# Patient Record
Sex: Female | Born: 1970 | Race: White | Hispanic: No | Marital: Married | Smoking: Never smoker
Health system: Southern US, Community
[De-identification: ages and names within clinical notes are randomized; demographics above are authoritative.]

## PROBLEM LIST (undated history)

## (undated) DIAGNOSIS — M7989 Other specified soft tissue disorders: Secondary | ICD-10-CM

## (undated) DIAGNOSIS — E559 Vitamin D deficiency, unspecified: Secondary | ICD-10-CM

## (undated) DIAGNOSIS — K5792 Diverticulitis of intestine, part unspecified, without perforation or abscess without bleeding: Secondary | ICD-10-CM

## (undated) DIAGNOSIS — K829 Disease of gallbladder, unspecified: Secondary | ICD-10-CM

## (undated) DIAGNOSIS — R519 Headache, unspecified: Secondary | ICD-10-CM

## (undated) DIAGNOSIS — M549 Dorsalgia, unspecified: Secondary | ICD-10-CM

## (undated) DIAGNOSIS — Z91013 Allergy to seafood: Secondary | ICD-10-CM

## (undated) DIAGNOSIS — R12 Heartburn: Secondary | ICD-10-CM

## (undated) DIAGNOSIS — K30 Functional dyspepsia: Secondary | ICD-10-CM

## (undated) DIAGNOSIS — R921 Mammographic calcification found on diagnostic imaging of breast: Secondary | ICD-10-CM

## (undated) DIAGNOSIS — K5900 Constipation, unspecified: Secondary | ICD-10-CM

## (undated) DIAGNOSIS — Z91018 Allergy to other foods: Secondary | ICD-10-CM

## (undated) DIAGNOSIS — R079 Chest pain, unspecified: Secondary | ICD-10-CM

## (undated) DIAGNOSIS — F419 Anxiety disorder, unspecified: Secondary | ICD-10-CM

## (undated) DIAGNOSIS — E538 Deficiency of other specified B group vitamins: Secondary | ICD-10-CM

## (undated) DIAGNOSIS — G4730 Sleep apnea, unspecified: Secondary | ICD-10-CM

## (undated) DIAGNOSIS — K219 Gastro-esophageal reflux disease without esophagitis: Secondary | ICD-10-CM

## (undated) DIAGNOSIS — R002 Palpitations: Secondary | ICD-10-CM

## (undated) DIAGNOSIS — R42 Dizziness and giddiness: Secondary | ICD-10-CM

## (undated) DIAGNOSIS — I10 Essential (primary) hypertension: Secondary | ICD-10-CM

## (undated) DIAGNOSIS — M2550 Pain in unspecified joint: Secondary | ICD-10-CM

## (undated) DIAGNOSIS — M797 Fibromyalgia: Secondary | ICD-10-CM

## (undated) HISTORY — DX: Vitamin D deficiency, unspecified: E55.9

## (undated) HISTORY — DX: Essential (primary) hypertension: I10

## (undated) HISTORY — DX: Constipation, unspecified: K59.00

## (undated) HISTORY — DX: Other specified soft tissue disorders: M79.89

## (undated) HISTORY — DX: Gastro-esophageal reflux disease without esophagitis: K21.9

## (undated) HISTORY — DX: Allergy to other foods: Z91.018

## (undated) HISTORY — DX: Heartburn: R12

## (undated) HISTORY — DX: Fibromyalgia: M79.7

## (undated) HISTORY — DX: Diverticulitis of intestine, part unspecified, without perforation or abscess without bleeding: K57.92

## (undated) HISTORY — DX: Functional dyspepsia: K30

## (undated) HISTORY — DX: Dorsalgia, unspecified: M54.9

## (undated) HISTORY — DX: Mammographic calcification found on diagnostic imaging of breast: R92.1

## (undated) HISTORY — DX: Sleep apnea, unspecified: G47.30

## (undated) HISTORY — DX: Deficiency of other specified B group vitamins: E53.8

## (undated) HISTORY — DX: Dizziness and giddiness: R42

## (undated) HISTORY — DX: Pain in unspecified joint: M25.50

## (undated) HISTORY — DX: Allergy to seafood: Z91.013

## (undated) HISTORY — DX: Anxiety disorder, unspecified: F41.9

## (undated) HISTORY — DX: Chest pain, unspecified: R07.9

## (undated) HISTORY — DX: Disease of gallbladder, unspecified: K82.9

## (undated) HISTORY — DX: Palpitations: R00.2

## (undated) HISTORY — DX: Headache, unspecified: R51.9

---

## 1994-03-09 HISTORY — PX: TONSILLECTOMY: SUR1361

## 1994-03-09 HISTORY — PX: GALLBLADDER SURGERY: SHX652

## 2007-03-10 HISTORY — PX: TOTAL VAGINAL HYSTERECTOMY: SHX2548

## 2008-03-09 HISTORY — PX: OTHER SURGICAL HISTORY: SHX169

## 2018-03-09 DIAGNOSIS — Z8781 Personal history of (healed) traumatic fracture: Secondary | ICD-10-CM

## 2018-03-09 HISTORY — DX: Personal history of (healed) traumatic fracture: Z87.81

## 2018-03-09 HISTORY — PX: OTHER SURGICAL HISTORY: SHX169

## 2020-04-04 ENCOUNTER — Encounter: Payer: Self-pay | Admitting: Neurology

## 2020-04-04 ENCOUNTER — Ambulatory Visit (INDEPENDENT_AMBULATORY_CARE_PROVIDER_SITE_OTHER): Payer: 59 | Admitting: Neurology

## 2020-04-04 ENCOUNTER — Other Ambulatory Visit: Payer: Self-pay

## 2020-04-04 ENCOUNTER — Ambulatory Visit: Payer: 59 | Admitting: Neurology

## 2020-04-04 VITALS — BP 129/82 | HR 80 | Ht 67.0 in | Wt 240.0 lb

## 2020-04-04 DIAGNOSIS — H81399 Other peripheral vertigo, unspecified ear: Secondary | ICD-10-CM

## 2020-04-04 DIAGNOSIS — M542 Cervicalgia: Secondary | ICD-10-CM

## 2020-04-04 DIAGNOSIS — R42 Dizziness and giddiness: Secondary | ICD-10-CM | POA: Diagnosis not present

## 2020-04-04 DIAGNOSIS — R51 Headache with orthostatic component, not elsewhere classified: Secondary | ICD-10-CM

## 2020-04-04 NOTE — Patient Instructions (Signed)
Discussed: muscle relaxers, botox(Dr. Terrace Arabia), dry needling and massage (PT or [email or web address]),  occipital nerve blocks, radiofrequency ablation of the occipital nerves, c2/c3 medial branch blocks  For dizziness: Vestibular rehabilitation. Consider "Vestibular Migraines" or cervicogenic dizziness  My recommendations: Vestibular therapy ([facility]) MRI of the brain  Dry needling and massage ([email or web address]) - I can place in the referral to see if they can do it in conjunctions with the vestibular therapy Contact us if you would like to try any of the above
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
## 2020-04-07 ENCOUNTER — Encounter: Payer: Self-pay | Admitting: Neurology

## 2020-04-07 DIAGNOSIS — M542 Cervicalgia: Secondary | ICD-10-CM | POA: Insufficient documentation

## 2020-04-07 DIAGNOSIS — R42 Dizziness and giddiness: Secondary | ICD-10-CM | POA: Insufficient documentation

## 2020-04-08 ENCOUNTER — Telehealth: Payer: Self-pay | Admitting: Neurology

## 2020-04-08 NOTE — Telephone Encounter (Signed)
no to the covid questions MR Brain w/wo contrast Dr. Lucia Gaskins [facility] Berkley Harvey: [id number] (exp. 04/08/20 to 05/23/20). Patient is scheduled at [facility] for 04/16/20.

## 2020-04-15 NOTE — Telephone Encounter (Signed)
Pt cancel MRI via automatic machine and I left the patient a voicemail to make sure she wanted to cancel and if she wanted to r/s to call my number and I left my direct number.

## 2020-04-16 ENCOUNTER — Other Ambulatory Visit: Payer: 59

## 2020-04-24 NOTE — Telephone Encounter (Signed)
Patient left me a voicemail on my phone wanted to r/s her MRI. I called the patient back and she wants to r/s her MRI but she wants to know is the contrast needed for the MRI or is it okay if she does it without contrast?

## 2020-04-24 NOTE — Telephone Encounter (Signed)
Yes, contrast is important for this patient's MRI. thanks

## 2020-04-25 NOTE — Telephone Encounter (Signed)
Noted, patient is scheduled at [facility] for 05/01/20.

## 2020-04-29 ENCOUNTER — Ambulatory Visit: Payer: 59 | Admitting: Physical Therapy

## 2020-05-01 ENCOUNTER — Other Ambulatory Visit: Payer: Self-pay

## 2020-05-01 ENCOUNTER — Ambulatory Visit: Payer: 59

## 2020-05-01 ENCOUNTER — Other Ambulatory Visit: Payer: Self-pay | Admitting: Neurology

## 2020-05-01 DIAGNOSIS — R42 Dizziness and giddiness: Secondary | ICD-10-CM

## 2020-05-01 DIAGNOSIS — R51 Headache with orthostatic component, not elsewhere classified: Secondary | ICD-10-CM

## 2020-05-02 ENCOUNTER — Other Ambulatory Visit: Payer: Self-pay

## 2020-05-02 ENCOUNTER — Ambulatory Visit: Payer: 59 | Attending: Neurology | Admitting: Physical Therapy

## 2020-05-02 ENCOUNTER — Encounter: Payer: Self-pay | Admitting: Physical Therapy

## 2020-05-02 ENCOUNTER — Other Ambulatory Visit: Payer: Self-pay | Admitting: Neurology

## 2020-05-02 DIAGNOSIS — R262 Difficulty in walking, not elsewhere classified: Secondary | ICD-10-CM | POA: Diagnosis present

## 2020-05-02 DIAGNOSIS — M542 Cervicalgia: Secondary | ICD-10-CM | POA: Insufficient documentation

## 2020-05-02 DIAGNOSIS — R42 Dizziness and giddiness: Secondary | ICD-10-CM | POA: Diagnosis present

## 2020-05-02 DIAGNOSIS — R2681 Unsteadiness on feet: Secondary | ICD-10-CM | POA: Diagnosis present

## 2020-05-02 MED ORDER — ALPRAZOLAM 0.25 MG PO TABS: ORAL_TABLET | ORAL | 0 refills | Status: DC

## 2020-05-02 NOTE — Addendum Note (Signed)
Addended by: Naomie Dean B on: 05/02/2020 11:13 AM   Modules accepted: Orders

## 2020-05-02 NOTE — Telephone Encounter (Signed)
The xanax is a good muscle relaxer, it should help with her neck pain too. I would try the xanax.

## 2020-05-02 NOTE — Therapy (Signed)
[facility] [address] [place], [place], [address] Phone: [phone]   Fax:  [phone]  Physical Therapy Evaluation  Patient Details  Name: Veronica Hunter MRN: [id number] Date of Birth: 04/19/1970 Referring Provider (PT): Anson Fret, MD   Encounter Date: 05/02/2020   PT End of Session - 05/02/20 2152    Visit Number 1    Number of Visits 17    Date for PT Re-Evaluation 07/01/20    Authorization Type [employer]; VL: 30    PT Start Time 1320    PT Stop Time 1410    PT Time Calculation (min) 50 min    Activity Tolerance Other (comment)   limited by dizziness   Behavior During Therapy [facility] for tasks assessed/performed           Past Medical History:  Diagnosis Date  . H/O fracture 2020   left foot, right ankle    Past Surgical History:  Procedure Laterality Date  . GALLBLADDER SURGERY  1996  . right ankle fracture surgery  2020   2 plates/10 screws   . right ankle tendon repair  2010  . TONSILLECTOMY  1996  . TOTAL VAGINAL HYSTERECTOMY  2009    There were no vitals filed for this visit.    Subjective Assessment - 05/02/20 1330    Subjective First episode of dizziness occurred in March 2019; [place] when sudden sense of spinning occurs with HA.  Symptoms settled when pt was able to lie down and rest.  When pt feels stressed or anxious she begins to feel pain radiate up her head, around to the eye, shoulders or jaw, sense of being on a boat, a "shock" feeling and vision feels unfocused.  Pt sometimes will have a HA.  Intermittently becomes sensitive to sound, smells or light.  Pt can have nausea but no vomiting.  Has a slight change in hearing.  Pt does have tinnitus.  Pt even feels symptoms after she eats with reflux.  Pt also reports when she lies down her arms will go numb; is now having to sleep on her back.  Episodes can last seconds up to 30 minutes.  Occurs daily.    Pertinent  History h/o R foot/ankle fracture with surgery and tendon repair, getting ready to have plates/screw removed, GERD, panic/anxiety    Diagnostic tests ENT; attempted to have MRI yesterday but not able to complete    Patient Stated Goals Try to figure out what the root cause of the symptoms are and not make it worse    Currently in Pain? Yes    Pain Score 5     Pain Location Neck    Pain Orientation Right    Pain Descriptors / Indicators Radiating;Headache    Pain Onset More than a month ago              [facility] PT Assessment - 05/02/20 1351      Assessment   Medical Diagnosis Vertigo    Referring Provider (PT) Anson Fret, MD    Onset Date/Surgical Date 04/07/20   referral date     Precautions   Precautions Other (comment)    Precaution Comments h/o R foot/ankle fracture with surgery and tendon repair      Balance Screen   Has the patient fallen in the past 6 months No      Home Environment   Living Environment Private residence    Living Arrangements Spouse/significant  other;Children    Type of Home House    Additional Comments Is driving; has had symptoms when driving      Prior Function   Level of Independence Independent    Vocation Requirements will not be working until summer      Observation/Other Assessments   Focus on Therapeutic Outcomes (FOTO)  Dizziness Positional Status: 59; Dizziness Functional Status: 46.2      Sensation   Light Touch Appears Intact      Coordination   Gross Motor Movements are Fluid and Coordinated Yes    Finger Nose Finger Test [facility]    Heel Shin Test WFL      ROM / Strength   AROM / PROM / Strength Strength;AROM      AROM   Overall AROM  Deficits    Overall AROM Comments Will assess at next visit; pt began to experience symptoms with strength testing and palpation and was concerned about driving afterwards      Strength   Overall Strength Deficits;Within functional limits for tasks performed    Overall Strength Comments  Bilat LE WFL; UE: shoulders 4-/5, elbow flexion/extension 4+/5, grip 4+/5      Palpation   Palpation comment Pt tender to palpation over bilat upper trap, rhomboids, suboccipital muscles and reported pain with light palpation and onset of symptoms      Ambulation/Gait   Ambulation/Gait Yes    Ambulation/Gait Assistance 7: Independent    Assistive device None    Gait Pattern Within Functional Limits                  Vestibular Assessment - 05/02/20 2148      Symptom Behavior   Type of Dizziness  "Funny feeling in head";Spinning    Frequency of Dizziness daily    Duration of Dizziness seconds > 30 minutes    Symptom Nature Spontaneous   delayed onset   Aggravating Factors Spontaneous onset;Activity in general    Relieving Factors No known relieving factors    Progression of Symptoms Worse              Objective measurements completed on examination: See above findings.               PT Education - 05/02/20 2150    Education Details clinical findings, PT POC and goals, recommended pt have family member drive her next session to allow therapist to perform more in depth cervical ROM and vestibular assessment; therapist not recommending dry needling at this time due to patient's hypersensitivity to light touch, central sensitization and role in symptoms    Person(s) Educated Patient    Methods Explanation    Comprehension Verbalized understanding            PT Short Term Goals - 05/02/20 2201      PT SHORT TERM GOAL #1   Title Pt will tolerate full assessment of cervical ROM and vestibular assessment    Baseline not able to tolerate on first day    Time 4    Period Weeks    Status New    Target Date 06/01/20      PT SHORT TERM GOAL #2   Title Pt will initiate HEP focusing on vestibular and neck exercises    Time 4    Period Weeks    Status New    Target Date 06/01/20      PT SHORT TERM GOAL #3   Title Pt will report 25% reduction in frequency  of dizziness episodes on a daily basis    Time 4    Period Weeks    Status New    Target Date 06/01/20             PT Long Term Goals - 05/02/20 2203      PT LONG TERM GOAL #1   Title Pt will demonstrate independence with final HEP    Time 8    Period Weeks    Status New    Target Date 07/01/20      PT LONG TERM GOAL #2   Title Pt will increase DPS to >/= 59 and DFS to >/= 51    Baseline DPS: 52; DFS: 46.2    Time 8    Period Weeks    Status New    Target Date 07/01/20      PT LONG TERM GOAL #3   Title Pt will increase pain free cervical spine ROM by 10 degrees in all directions    Baseline TBD    Time 8    Period Weeks    Status New    Target Date 07/01/20      PT LONG TERM GOAL #4   Title Pt will report 75% reduction in symptoms on a daily basis    Time 8    Period Weeks    Status New    Target Date 07/01/20      PT LONG TERM GOAL #5   Title Vestibular goal as needed                  Plan - 05/02/20 2153    Clinical Impression Statement Pt is a 50 year old female referred to Neuro OPPT for evaluation of dizziness/vertigo that began in 2019.  Pt's PMH is significant for the following: R ankle fracture with surgery and tendon repair, GERD and pt reports anxiety/panic attacks. The following deficits were noted during pt's exam: disequilibrium, pain in neck and shoulders, decreased cervical spine ROM with increased mm tension and hypersensitivity to light touch and palpation, motion sensitivity, impaired balance and difficulty walking when having an episode of dizziness.  Will continue with more in depth cervical spine assessment and vestibular assessment next session when patient can be transported by her daughter. Pt would benefit from skilled PT to address these impairments and functional limitations to maximize functional mobility independence and reduce falls risk.    Personal Factors and Comorbidities Comorbidity 1;Fitness;Past/Current Experience;Time  since onset of injury/illness/exacerbation    Comorbidities h/o R foot/ankle fracture with surgery and tendon repair    Examination-Activity Limitations Locomotion Level;Sleep;Stand    Examination-Participation Restrictions Cleaning;Community Activity;Driving;Meal Prep;Occupation;Shop    Stability/Clinical Decision Making Evolving/Moderate complexity    Clinical Decision Making Moderate    Rehab Potential Good    PT Frequency 2x / week    PT Duration 8 weeks    PT Treatment/Interventions ADLs/Self Care Home Management;Aquatic Therapy;Canalith Repostioning;Cryotherapy;Electrical Stimulation;Moist Heat;Gait training;Stair training;Functional mobility training;Patient/family education;Therapeutic activities;Therapeutic exercise;Balance training;Neuromuscular re-education;Manual techniques;Passive range of motion;Dry needling;Vestibular    PT Next Visit Plan Neck ROM; in depth vestibular assessment.  Initiate HEP; would suboccipital release help?  TDN eventually?    Consulted and Agree with Plan of Care Patient           Patient will benefit from skilled therapeutic intervention in order to improve the following deficits and impairments:  Decreased balance,Decreased range of motion,Difficulty walking,Dizziness,Pain,Decreased activity tolerance  Visit Diagnosis: Dizziness and giddiness  Unsteadiness on feet  Cervicalgia  Difficulty in walking, not elsewhere classified     Problem List Patient Active Problem List   Diagnosis Date Noted  . Dizziness 04/07/2020  . Cervicalgia 04/07/2020    Dierdre Highman, PT, DPT 05/02/20    10:07 PM    [facility] [address] [place], [place], [address] Phone: [phone]   Fax:  [phone]  Name: Cathe Bilger MRN: [id number] Date of Birth: [birth year]

## 2020-05-02 NOTE — Telephone Encounter (Signed)
Done, thanks

## 2020-05-02 NOTE — Telephone Encounter (Signed)
Caryn Bee the MRI tech told me that the patient was unable to do the MRI because she kept having stomach issues and kept feeling like she was going to throw up. Before I call the patient to r/s do you want her to come back and it with and with out contrast?

## 2020-05-02 NOTE — Telephone Encounter (Signed)
I would like her to come back and have it with/without contrast. And I will send her in some xanax, she will need a driver.

## 2020-05-02 NOTE — Telephone Encounter (Signed)
I spoke with the patient to reschedule. She says she wants to r/s but she said she needs something to help with her pain with her neck. She said when she is laying down on that little pillow it was causing pain in her neck and that is why she could not lay there and do the MRI. I asked her if she thought she was claustrophobic she said no it was just the pain in her neck.

## 2020-05-02 NOTE — Telephone Encounter (Signed)
Okay perfect when you get a chance can you put a new MRI brain w/wo contrast order in.

## 2020-05-06 ENCOUNTER — Encounter: Payer: Self-pay | Admitting: Physical Therapy

## 2020-05-06 ENCOUNTER — Other Ambulatory Visit: Payer: Self-pay

## 2020-05-06 ENCOUNTER — Ambulatory Visit: Payer: 59 | Admitting: Physical Therapy

## 2020-05-06 DIAGNOSIS — R262 Difficulty in walking, not elsewhere classified: Secondary | ICD-10-CM

## 2020-05-06 DIAGNOSIS — M542 Cervicalgia: Secondary | ICD-10-CM

## 2020-05-06 DIAGNOSIS — R42 Dizziness and giddiness: Secondary | ICD-10-CM | POA: Diagnosis not present

## 2020-05-06 DIAGNOSIS — R2681 Unsteadiness on feet: Secondary | ICD-10-CM

## 2020-05-06 NOTE — Telephone Encounter (Signed)
Just an FYI  I spoke to the patient and informed her per Dr. Lucia Gaskins the Xanax should help with her neck. She stated that she wants to hold off for right now and do PT first. If nothing changes she stated she will call back to get it r/s.

## 2020-05-06 NOTE — Therapy (Signed)
[facility] [address] [place], [place], [address] Phone: [phone]   Fax:  [phone]  Physical Therapy Treatment  Patient Details  Name: Veronica Hunter MRN: [id number] Date of Birth: January 27, 1971 Referring Provider (PT): Anson Fret, MD   Encounter Date: 05/06/2020   PT End of Session - 05/06/20 1709    Visit Number 2    Number of Visits 17    Date for PT Re-Evaluation 07/01/20    Authorization Type UHC; VL: 30    PT Start Time 1536    PT Stop Time 1620    PT Time Calculation (min) 44 min    Activity Tolerance Other (comment)   limited by dizziness   Behavior During Therapy [facility] for tasks assessed/performed           Past Medical History:  Diagnosis Date  . H/O fracture 2020   left foot, right ankle    Past Surgical History:  Procedure Laterality Date  . GALLBLADDER SURGERY  1996  . right ankle fracture surgery  2020   2 plates/10 screws   . right ankle tendon repair  2010  . TONSILLECTOMY  1996  . TOTAL VAGINAL HYSTERECTOMY  2009    There were no vitals filed for this visit.   Subjective Assessment - 05/06/20 1541    Subjective Had a pretty good weekend but neck and back are bothering her today.  Forgot to have daughter bring her today so pt drove herself.    Pertinent History h/o R foot/ankle fracture with surgery and tendon repair, getting ready to have plates/screw removed, GERD, panic/anxiety    Diagnostic tests ENT; attempted to have MRI yesterday but not able to complete    Patient Stated Goals Try to figure out what the root cause of the symptoms are and not make it worse    Currently in Pain? Yes    Pain Score 5     Pain Location Neck    Pain Descriptors / Indicators Discomfort    Pain Onset More than a month ago              [facility] PT Assessment - 05/06/20 1544      AROM   Overall AROM  Deficits    Overall AROM Comments felt "pinchy" with lateral flexion to L and R; no  dizziness immediately after performing    AROM Assessment Site Cervical    Cervical Flexion 50    Cervical Extension 30    Cervical - Right Side Bend 22    Cervical - Left Side Bend 25    Cervical - Right Rotation 38    Cervical - Left Rotation 40               Vestibular Assessment - 05/06/20 1549      Symptom Behavior   Subjective history of current problem neck/back pain today.  No HA, fogginess or nausea today    Type of Dizziness  "Funny feeling in head";Spinning    Frequency of Dizziness daily    Duration of Dizziness seconds > 30 minutes    Symptom Nature Spontaneous    Aggravating Factors Spontaneous onset;Activity in general    Relieving Factors No known relieving factors    Progression of Symptoms Worse      Oculomotor Exam   Oculomotor Alignment Normal    Spontaneous Absent    Gaze-induced  Absent    Smooth Pursuits Comment   eye strain   Saccades Slow;Comment  worse eye train horizontally     Vestibulo-Ocular Reflex   VOR to Slow Head Movement Normal;Comment   a sense of tightness   VOR Cancellation Comment    Comment mild dizziness afterwards      Positional Sensitivities   Sit to Supine No dizziness    Supine to Left Side Lightheadedness    Supine to Right Side Lightheadedness    Supine to Sitting Lightheadedness    Nose to Right Knee No dizziness    Right Knee to Sitting Mild dizziness    Nose to Left Knee No dizziness    Left Knee to Sitting Mild dizziness    Head Turning x 5 Mild dizziness    Head Nodding x 5 No dizziness   tightness in the head   Pivot Right in Standing Mild dizziness    Pivot Left in Standing Mild dizziness    Rolling Right Lightheadedness    Rolling Left Lightheadedness    Positional Sensitivities Comments reports sense of sway/being on a boat and tightness in back of the head                            PT Education - 05/06/20 1706    Education Details clinical findings and differential diagnoses:  cervicogenic vs. vestibular migraine; motion sensitivity and treatment goals, will initiate HEP next visit    Person(s) Educated Patient    Methods Explanation    Comprehension Verbalized understanding            PT Short Term Goals - 05/02/20 2201      PT SHORT TERM GOAL #1   Title Pt will tolerate full assessment of cervical ROM and vestibular assessment    Baseline not able to tolerate on first day    Time 4    Period Weeks    Status New    Target Date 06/01/20      PT SHORT TERM GOAL #2   Title Pt will initiate HEP focusing on vestibular and neck exercises    Time 4    Period Weeks    Status New    Target Date 06/01/20      PT SHORT TERM GOAL #3   Title Pt will report 25% reduction in frequency of dizziness episodes on a daily basis    Time 4    Period Weeks    Status New    Target Date 06/01/20             PT Long Term Goals - 05/02/20 2203      PT LONG TERM GOAL #1   Title Pt will demonstrate independence with final HEP    Time 8    Period Weeks    Status New    Target Date 07/01/20      PT LONG TERM GOAL #2   Title Pt will increase DPS to >/= 59 and DFS to >/= 51    Baseline DPS: 52; DFS: 46.2    Time 8    Period Weeks    Status New    Target Date 07/01/20      PT LONG TERM GOAL #3   Title Pt will increase pain free cervical spine ROM by 10 degrees in all directions    Baseline TBD    Time 8    Period Weeks    Status New    Target Date 07/01/20      PT LONG TERM GOAL #4  Title Pt will report 75% reduction in symptoms on a daily basis    Time 8    Period Weeks    Status New    Target Date 07/01/20      PT LONG TERM GOAL #5   Title Vestibular goal as needed                 Plan - 05/06/20 1710    Clinical Impression Statement Continued to perform assessment of cervical spine and vestibular system.  Pt does present with cervical spine ROM limitations and significant visual motion sensitivity; pt also presents with some occipital  symptoms after performing repeated head/neck movements.  Unable to formally test VOR with head impulse test.  Will initiate habituation and gentle cervical ROM exercises next session.  Pt reported mild symptoms throughout assessment but had returned to baseline by end of session.    Personal Factors and Comorbidities Comorbidity 1;Fitness;Past/Current Experience;Time since onset of injury/illness/exacerbation    Comorbidities h/o R foot/ankle fracture with surgery and tendon repair    Examination-Activity Limitations Locomotion Level;Sleep;Stand    Examination-Participation Restrictions Cleaning;Community Activity;Driving;Meal Prep;Occupation;Shop    Stability/Clinical Decision Making Evolving/Moderate complexity    Rehab Potential Good    PT Frequency 2x / week    PT Duration 8 weeks    PT Treatment/Interventions ADLs/Self Care Home Management;Aquatic Therapy;Canalith Repostioning;Cryotherapy;Electrical Stimulation;Moist Heat;Gait training;Stair training;Functional mobility training;Patient/family education;Therapeutic activities;Therapeutic exercise;Balance training;Neuromuscular re-education;Manual techniques;Passive range of motion;Dry needling;Vestibular    PT Next Visit Plan Initiate HEP - habituation to visual motion sensitivity; gentle neck ROM, compensatory saccades.  would suboccipital release help?  TDN eventually?    Consulted and Agree with Plan of Care Patient           Patient will benefit from skilled therapeutic intervention in order to improve the following deficits and impairments:  Decreased balance,Decreased range of motion,Difficulty walking,Dizziness,Pain,Decreased activity tolerance  Visit Diagnosis: Dizziness and giddiness  Unsteadiness on feet  Cervicalgia  Difficulty in walking, not elsewhere classified     Problem List Patient Active Problem List   Diagnosis Date Noted  . Dizziness 04/07/2020  . Cervicalgia 04/07/2020    Dierdre Highman, PT,  DPT 05/06/20    5:18 PM    [facility] [address] [place], [place], [address] Phone: [phone]   Fax:  [phone]  Name: Veronica Hunter MRN: [id number] Date of Birth: [birth year]

## 2020-05-09 ENCOUNTER — Encounter: Payer: Self-pay | Admitting: Physical Therapy

## 2020-05-09 ENCOUNTER — Ambulatory Visit: Payer: 59 | Attending: Neurology | Admitting: Physical Therapy

## 2020-05-09 ENCOUNTER — Other Ambulatory Visit: Payer: Self-pay

## 2020-05-09 DIAGNOSIS — R2681 Unsteadiness on feet: Secondary | ICD-10-CM | POA: Diagnosis present

## 2020-05-09 DIAGNOSIS — R262 Difficulty in walking, not elsewhere classified: Secondary | ICD-10-CM

## 2020-05-09 DIAGNOSIS — R42 Dizziness and giddiness: Secondary | ICD-10-CM | POA: Diagnosis present

## 2020-05-09 DIAGNOSIS — M542 Cervicalgia: Secondary | ICD-10-CM | POA: Diagnosis present

## 2020-05-09 NOTE — Therapy (Signed)
[facility] [address] [place], [place], [address] Phone: [phone]   Fax:  [phone]  Physical Therapy Treatment  Patient Details  Name: Veronica Hunter MRN: [id number] Date of Birth: 07-26-1970 Referring Provider (PT): Anson Fret, MD   Encounter Date: 05/09/2020   PT End of Session - 05/09/20 1135    Visit Number 3    Number of Visits 17    Date for PT Re-Evaluation 07/01/20    Authorization Type UHC; VL: 30    PT Start Time 0940    PT Stop Time 1023    PT Time Calculation (min) 43 min    Activity Tolerance Patient tolerated treatment well    Behavior During Therapy [facility] for tasks assessed/performed           Past Medical History:  Diagnosis Date  . H/O fracture 2020   left foot, right ankle    Past Surgical History:  Procedure Laterality Date  . GALLBLADDER SURGERY  1996  . right ankle fracture surgery  2020   2 plates/10 screws   . right ankle tendon repair  2010  . TONSILLECTOMY  1996  . TOTAL VAGINAL HYSTERECTOMY  2009    There were no vitals filed for this visit.   Subjective Assessment - 05/09/20 0944    Subjective No significant symptoms after last session but (spinning or boat feeling) but did have a lingering visual symptoms for 24 hours; back to baseline today.  This morning had a little bit of boat rocking symptoms but settled quickly.    Pertinent History h/o R foot/ankle fracture with surgery and tendon repair, getting ready to have plates/screw removed, GERD, panic/anxiety    Diagnostic tests ENT; attempted to have MRI yesterday but not able to complete    Patient Stated Goals Try to figure out what the root cause of the symptoms are and not make it worse    Pain Location Neck    Pain Orientation Left    Pain Descriptors / Indicators Tightness    Pain Onset More than a month ago                             [facility] PT Treatment/Exercise - 05/09/20 1009       Exercises   Exercises Other Exercises    Other Exercises  neck          Reviewed and had pt return demonstrate the following exercises below for HEP:   Access Code: [email or web address] URL: [email or web address] Date: 05/09/2020 Prepared by: Bufford Lope  Exercises Seated Gentle Upper Trapezius Stretch - 1 x daily - 7 x weekly - 3 sets - 3-4 deep breaths hold Seated Shoulder Rolls - 1 x daily - 7 x weekly - 1 sets - 10 reps Seated Assisted Cervical Rotation with Towel - 1 x daily - 7 x weekly - 3 sets - 5-6 reps hold Eyes Stable - Head says "No" - 1 x daily - 7 x weekly - 2 sets - 30 second hold Seated Gaze Stabilization with Head Nod - 1 x daily - 7 x weekly - 2 sets - 30 second hold Standing with Head Rotation - 1 x daily - 7 x weekly - 2 sets - 10 reps Standing with Head Nod - 1 x daily - 7 x weekly - 2 sets - 10 reps        PT Education - 05/09/20 1134  Education Details initiated gentle neck and vestibular habituation/balance HEP; educated pt on use of multiple systems for balance and purpose of exercises; educated pt on goal of minimal increase in symptoms and allowing symptoms to return to baseline before continuing    Person(s) Educated Patient    Methods Explanation;Demonstration;Handout    Comprehension Verbalized understanding;Returned demonstration            PT Short Term Goals - 05/02/20 2201      PT SHORT TERM GOAL #1   Title Pt will tolerate full assessment of cervical ROM and vestibular assessment    Baseline not able to tolerate on first day    Time 4    Period Weeks    Status New    Target Date 06/01/20      PT SHORT TERM GOAL #2   Title Pt will initiate HEP focusing on vestibular and neck exercises    Time 4    Period Weeks    Status New    Target Date 06/01/20      PT SHORT TERM GOAL #3   Title Pt will report 25% reduction in frequency of dizziness episodes on a daily basis    Time 4    Period Weeks    Status New    Target  Date 06/01/20             PT Long Term Goals - 05/02/20 2203      PT LONG TERM GOAL #1   Title Pt will demonstrate independence with final HEP    Time 8    Period Weeks    Status New    Target Date 07/01/20      PT LONG TERM GOAL #2   Title Pt will increase DPS to >/= 59 and DFS to >/= 51    Baseline DPS: 52; DFS: 46.2    Time 8    Period Weeks    Status New    Target Date 07/01/20      PT LONG TERM GOAL #3   Title Pt will increase pain free cervical spine ROM by 10 degrees in all directions    Baseline TBD    Time 8    Period Weeks    Status New    Target Date 07/01/20      PT LONG TERM GOAL #4   Title Pt will report 75% reduction in symptoms on a daily basis    Time 8    Period Weeks    Status New    Target Date 07/01/20      PT LONG TERM GOAL #5   Title Vestibular goal as needed                 Plan - 05/09/20 1136    Clinical Impression Statement Initiated cervical ROM, vestibular habituation and balance HEP above.  Reviewed each exercise with patient with pt return demonstrating.  Focused on gentle movements with breathing/relaxation and discussed goal of keeping symptoms mild.  Pt reported very mild symptoms today with exercises.  Will continue to progress as pt is able to tolerate.    Personal Factors and Comorbidities Comorbidity 1;Fitness;Past/Current Experience;Time since onset of injury/illness/exacerbation    Comorbidities h/o R foot/ankle fracture with surgery and tendon repair    Examination-Activity Limitations Locomotion Level;Sleep;Stand    Examination-Participation Restrictions Cleaning;Community Activity;Driving;Meal Prep;Occupation;Shop    Stability/Clinical Decision Making Evolving/Moderate complexity    Rehab Potential Good    PT Frequency 2x / week  PT Duration 8 weeks    PT Treatment/Interventions ADLs/Self Care Home Management;Aquatic Therapy;Canalith Repostioning;Cryotherapy;Electrical Stimulation;Moist Heat;Gait training;Stair  training;Functional mobility training;Patient/family education;Therapeutic activities;Therapeutic exercise;Balance training;Neuromuscular re-education;Manual techniques;Passive range of motion;Dry needling;Vestibular    PT Next Visit Plan check on and progress HEP - habituation to visual motion sensitivity; neck ROM, corner balance.  would suboccipital release help?  TDN eventually?    Consulted and Agree with Plan of Care Patient           Patient will benefit from skilled therapeutic intervention in order to improve the following deficits and impairments:  Decreased balance,Decreased range of motion,Difficulty walking,Dizziness,Pain,Decreased activity tolerance  Visit Diagnosis: Dizziness and giddiness  Unsteadiness on feet  Cervicalgia  Difficulty in walking, not elsewhere classified     Problem List Patient Active Problem List   Diagnosis Date Noted  . Dizziness 04/07/2020  . Cervicalgia 04/07/2020    Dierdre Highman, PT, DPT 05/09/20    11:40 AM    [facility] [address] [place], [place], [address] Phone: [phone]   Fax:  [phone]  Name: Veronica Hunter MRN: [id number] Date of Birth: 09-12-1970

## 2020-05-09 NOTE — Patient Instructions (Signed)
Access Code: [email or web address] URL: [email or web address] Date: 05/09/2020 Prepared by: Bufford Lope  Exercises Seated Gentle Upper Trapezius Stretch - 1 x daily - 7 x weekly - 3 sets - 3-4 deep breaths hold Seated Shoulder Rolls - 1 x daily - 7 x weekly - 1 sets - 10 reps Seated Assisted Cervical Rotation with Towel - 1 x daily - 7 x weekly - 3 sets - 5-6 reps hold Eyes Stable - Head says "No" - 1 x daily - 7 x weekly - 2 sets - 30 second hold Seated Gaze Stabilization with Head Nod - 1 x daily - 7 x weekly - 2 sets - 30 second hold Standing with Head Rotation - 1 x daily - 7 x weekly - 2 sets - 10 reps Standing with Head Nod - 1 x daily - 7 x weekly - 2 sets - 10 reps

## 2020-05-14 ENCOUNTER — Other Ambulatory Visit: Payer: Self-pay

## 2020-05-14 ENCOUNTER — Encounter: Payer: Self-pay | Admitting: Physical Therapy

## 2020-05-14 ENCOUNTER — Ambulatory Visit: Payer: 59 | Admitting: Physical Therapy

## 2020-05-14 DIAGNOSIS — R42 Dizziness and giddiness: Secondary | ICD-10-CM

## 2020-05-14 DIAGNOSIS — R262 Difficulty in walking, not elsewhere classified: Secondary | ICD-10-CM

## 2020-05-14 DIAGNOSIS — M542 Cervicalgia: Secondary | ICD-10-CM

## 2020-05-14 DIAGNOSIS — R2681 Unsteadiness on feet: Secondary | ICD-10-CM

## 2020-05-14 NOTE — Patient Instructions (Addendum)
Access Code: [email or web address] URL: [email or web address] Date: 05/14/2020 Prepared by: Bufford Lope  Exercises Seated Gentle Upper Trapezius Stretch - 1 x daily - 7 x weekly - 3 sets - 3-4 deep breaths hold Seated Shoulder Rolls - 1 x daily - 7 x weekly - 1 sets - 10 reps Seated Assisted Cervical Rotation with Towel - 1 x daily - 7 x weekly - 3 sets - 5-6 reps hold Standing with Head Rotation - 1 x daily - 7 x weekly - 2 sets - 10 reps Standing with Head Nod - 1 x daily - 7 x weekly - 2 sets - 10 reps Standing Gaze Stabilization with Head Rotation - 1 x daily - 7 x weekly - 2 sets - 30 seconds hold Standing Gaze Stabilization with Head Nod - 1 x daily - 7 x weekly - 2 sets - 30 seconds hold Wide Stance with Eyes Closed - 1 x daily - 7 x weekly - 2 sets - 10 seconds hold

## 2020-05-14 NOTE — Therapy (Signed)
[facility] [address] [place], [place], [address] Phone: [phone]   Fax:  [phone]  Physical Therapy Treatment  Patient Details  Name: Veronica Hunter MRN: [id number] Date of Birth: 1970-08-16 Referring Provider (PT): Anson Fret, MD   Encounter Date: 05/14/2020   PT End of Session - 05/14/20 1512    Visit Number 4    Number of Visits 17    Date for PT Re-Evaluation 07/01/20    Authorization Type UHC; VL: 30    PT Start Time 1417    PT Stop Time 1500    PT Time Calculation (min) 43 min    Activity Tolerance Patient tolerated treatment well    Behavior During Therapy [facility] for tasks assessed/performed           Past Medical History:  Diagnosis Date  . H/O fracture 2020   left foot, right ankle    Past Surgical History:  Procedure Laterality Date  . GALLBLADDER SURGERY  1996  . right ankle fracture surgery  2020   2 plates/10 screws   . right ankle tendon repair  2010  . TONSILLECTOMY  1996  . TOTAL VAGINAL HYSTERECTOMY  2009    There were no vitals filed for this visit.   Subjective Assessment - 05/14/20 1419    Subjective Had food poisoning this weekend and had to be in the bed for about 24 hours.  Feeling better and it did not result in a flare of symptoms.  Has had a good day; did exercises this morning and didn't have a lot of motion sickness with it.  Went to church on Sunday; building does not have windows and has low lighting.  When the music started, especially with drums or loud bass she begins to feel symptoms of wooziness/dizziness when standing.    Pertinent History h/o R foot/ankle fracture with surgery and tendon repair, getting ready to have plates/screw removed, GERD, panic/anxiety    Diagnostic tests ENT; attempted to have MRI yesterday but not able to complete    Patient Stated Goals Try to figure out what the root cause of the symptoms are and not make it worse    Currently in  Pain? Yes    Pain Onset More than a month ago                              Vestibular Treatment/Exercise - 05/14/20 1506      Vestibular Treatment/Exercise   Gaze Exercises X1 Viewing Horizontal;X1 Viewing Vertical      X1 Viewing Horizontal   Foot Position standing feet apart    Reps 2    Comments 30 sec; mild dizziness, neck tightness      X1 Viewing Vertical   Foot Position standing, feet apart    Reps 2    Comments 30 sec, mild dizziness              Balance Exercises - 05/14/20 1507      Balance Exercises: Standing   Standing Eyes Opened Wide (BOA);Head turns;2 reps   10 reps; horizontal and vertical head turns, moderate dizziness; required UE support following completion for stability   Standing Eyes Closed Wide (BOA);Solid surface;30 secs   verbal cueing for feeling pressure in feet; added lateral weight shifts 15 sec.  No onset of symptoms.  Attempted anterior/posterior weight shifts but unable to complete due to feeling of falling  PT Education - 05/14/20 1510    Education Details progressed HEP, stabilization and single point of focus when there is an episode of symptoms, proprioception and weight shifting    Person(s) Educated Patient    Methods Explanation;Handout;Demonstration    Comprehension Verbalized understanding            PT Short Term Goals - 05/02/20 2201      PT SHORT TERM GOAL #1   Title Pt will tolerate full assessment of cervical ROM and vestibular assessment    Baseline not able to tolerate on first day    Time 4    Period Weeks    Status New    Target Date 06/01/20      PT SHORT TERM GOAL #2   Title Pt will initiate HEP focusing on vestibular and neck exercises    Time 4    Period Weeks    Status New    Target Date 06/01/20      PT SHORT TERM GOAL #3   Title Pt will report 25% reduction in frequency of dizziness episodes on a daily basis    Time 4    Period Weeks    Status New    Target Date  06/01/20             PT Long Term Goals - 05/02/20 2203      PT LONG TERM GOAL #1   Title Pt will demonstrate independence with final HEP    Time 8    Period Weeks    Status New    Target Date 07/01/20      PT LONG TERM GOAL #2   Title Pt will increase DPS to >/= 59 and DFS to >/= 51    Baseline DPS: 52; DFS: 46.2    Time 8    Period Weeks    Status New    Target Date 07/01/20      PT LONG TERM GOAL #3   Title Pt will increase pain free cervical spine ROM by 10 degrees in all directions    Baseline TBD    Time 8    Period Weeks    Status New    Target Date 07/01/20      PT LONG TERM GOAL #4   Title Pt will report 75% reduction in symptoms on a daily basis    Time 8    Period Weeks    Status New    Target Date 07/01/20      PT LONG TERM GOAL #5   Title Vestibular goal as needed                 Plan - 05/14/20 1514    Clinical Impression Statement Progressed VOR to include standing; pt expereienced mild dizziness following, horizontal > vertical.  Continued habituation exercises with head turns, patient experienced mild to moderate symptoms following, with symptom onset immediatley following completetion of exercises. Provided cueing to include a single point of focus and light UE support to reorient self.  Lateral weightshifting with EC did not provoke symptoms.  Pts prior ankle surgery provides additional challenge to proprioceptive input.  Pt was unable to complete anterior/posterior weightshifts due to feelings of falling and LOB.  Will continue to progress habituation and balance exercises with continued pt tolerance.    Personal Factors and Comorbidities Comorbidity 1;Fitness;Past/Current Experience;Time since onset of injury/illness/exacerbation    Comorbidities h/o R foot/ankle fracture with surgery and tendon repair    Examination-Activity Limitations Locomotion  Level;Sleep;Stand    Examination-Participation Restrictions Cleaning;Community  Activity;Driving;Meal Prep;Occupation;Shop    Stability/Clinical Decision Making Evolving/Moderate complexity    Rehab Potential Good    PT Frequency 2x / week    PT Duration 8 weeks    PT Treatment/Interventions ADLs/Self Care Home Management;Aquatic Therapy;Canalith Repostioning;Cryotherapy;Electrical Stimulation;Moist Heat;Gait training;Stair training;Functional mobility training;Patient/family education;Therapeutic activities;Therapeutic exercise;Balance training;Neuromuscular re-education;Manual techniques;Passive range of motion;Dry needling;Vestibular    PT Next Visit Plan check superior canal due to sensitivity to vibration and loud noises.  check on and progress HEP; VOR, EC, weightshifting, corner balancing, neck ROM - would suboccipital release help?  TDN eventually?    Consulted and Agree with Plan of Care Patient           Patient will benefit from skilled therapeutic intervention in order to improve the following deficits and impairments:  Decreased balance,Decreased range of motion,Difficulty walking,Dizziness,Pain,Decreased activity tolerance  Visit Diagnosis: Dizziness and giddiness  Unsteadiness on feet  Difficulty in walking, not elsewhere classified  Cervicalgia     Problem List Patient Active Problem List   Diagnosis Date Noted  . Dizziness 04/07/2020  . Cervicalgia 04/07/2020    Brooke Dare, SPT 05/14/2020, 5:30 PM  [facility] [address] [place], [place], [address] Phone: [phone]   Fax:  [phone]  Name: Veronica Hunter MRN: [id number] Date of Birth: 30-Sep-1970

## 2020-05-16 ENCOUNTER — Ambulatory Visit: Payer: 59 | Admitting: Physical Therapy

## 2020-05-20 ENCOUNTER — Encounter: Payer: Self-pay | Admitting: Physical Therapy

## 2020-05-20 ENCOUNTER — Other Ambulatory Visit: Payer: Self-pay

## 2020-05-20 ENCOUNTER — Ambulatory Visit: Payer: 59 | Admitting: Physical Therapy

## 2020-05-20 DIAGNOSIS — R42 Dizziness and giddiness: Secondary | ICD-10-CM

## 2020-05-20 DIAGNOSIS — M542 Cervicalgia: Secondary | ICD-10-CM

## 2020-05-20 DIAGNOSIS — R2681 Unsteadiness on feet: Secondary | ICD-10-CM

## 2020-05-20 NOTE — Telephone Encounter (Signed)
Patient called me and informed me that she would like to get her MRI r/s. But she was wanting to know if she go to the location wherever her GI doctor sends her MRI too because it with [facility] and its an open MRI machine. I informed her she can definitely have her MRI there I would just need to know the sight location and I can fax Dr. Lucia Gaskins MRI there. She stated she would get back to me once she got that information.

## 2020-05-20 NOTE — Patient Instructions (Signed)
Access Code: [email or web address] URL: [email or web address] Date: 05/20/2020 Prepared by: Hatem Cull  Exercises Seated Gentle Upper Trapezius Stretch - 1 x daily - 7 x weekly - 3 sets - 3-4 deep breaths hold Seated Shoulder Rolls - 1 x daily - 7 x weekly - 1 sets - 10 reps Seated Assisted Cervical Rotation with Towel - 1 x daily - 7 x weekly - 3 sets - 5-6 reps hold Standing with Head Rotation - 1 x daily - 7 x weekly - 2 sets - 10 reps Standing with Head Nod - 1 x daily - 7 x weekly - 2 sets - 10 reps Standing Gaze Stabilization with Head Rotation - 1 x daily - 7 x weekly - 2 sets - 40 seconds hold Standing Gaze Stabilization with Head Nod - 1 x daily - 7 x weekly - 2 sets - 40 seconds hold Wide Stance with Eyes Closed - 1 x daily - 7 x weekly - 2 sets - 10 seconds hold    

## 2020-05-20 NOTE — Therapy (Signed)
[facility] [address] [place], [place], [address] Phone: [phone]   Fax:  [phone]  Physical Therapy Treatment  Patient Details  Name: Veronica Hunter MRN: [id number] Date of Birth: [birth year] Referring Provider (PT): Anson Fret, MD   Encounter Date: 05/20/2020   PT End of Session - 05/20/20 1647    Visit Number 5    Number of Visits 17    Date for PT Re-Evaluation 07/01/20    Authorization Type UHC; VL: 30    PT Start Time 1539    PT Stop Time 1635    PT Time Calculation (min) 56 min    Activity Tolerance Patient tolerated treatment well    Behavior During Therapy [facility] for tasks assessed/performed           Past Medical History:  Diagnosis Date  . H/O fracture 2020   left foot, right ankle    Past Surgical History:  Procedure Laterality Date  . GALLBLADDER SURGERY  1996  . right ankle fracture surgery  2020   2 plates/10 screws   . right ankle tendon repair  2010  . TONSILLECTOMY  1996  . TOTAL VAGINAL HYSTERECTOMY  2009    There were no vitals filed for this visit.   Subjective Assessment - 05/20/20 1542    Subjective Got hair cut last week and it didn't bring on symptoms.  Has a little HA today but thinks it is due to pollen.  Asking about POTS and vasovagal reaction.  Symptoms not worse but are about the same.    Pertinent History h/o R foot/ankle fracture with surgery and tendon repair, getting ready to have plates/screw removed, GERD, panic/anxiety    Diagnostic tests ENT; attempted to have MRI yesterday but not able to complete    Patient Stated Goals Try to figure out what the root cause of the symptoms are and not make it worse    Currently in Pain? Yes    Pain Onset More than a month ago                   Vestibular Assessment - 05/20/20 1646      Oculomotor Exam   Head shaking Horizontal Absent      Other Tests   Tragal with frenzel lenses - WNL    Comments  Valsalva test with frenzel lenses: WNL                     Vestibular Treatment/Exercise - 05/20/20 1641      Vestibular Treatment/Exercise   Gaze Exercises X1 Viewing Horizontal;X1 Viewing Vertical      X1 Viewing Horizontal   Foot Position standing feet apart    Reps 2    Comments 2x30 sec - mild symptoms.  x44 sec moderate symptoms      X1 Viewing Vertical   Foot Position stanidng feet apart    Reps 2    Comments x30 - no symptoms              Balance Exercises - 05/20/20 1639      Balance Exercises: Standing   Standing Eyes Opened Wide (BOA);Head turns;Solid surface   10 reps, horizontal and vertical.  UE support needed following completion of exercises.  Moderate symptoms.   Standing Eyes Closed Wide (BOA);Solid surface   60 secs.  Intermittent UE support needed to ragain stability.  No provocation of symptoms.  PT Education - 05/20/20 1644    Education Details Progressed HEP, symptom management, education and information about POTS and vasovagal syncope, results of test for superior canal    Person(s) Educated Patient    Methods Explanation;Handout    Comprehension Verbalized understanding            PT Short Term Goals - 05/02/20 2201      PT SHORT TERM GOAL #1   Title Pt will tolerate full assessment of cervical ROM and vestibular assessment    Baseline not able to tolerate on first day    Time 4    Period Weeks    Status New    Target Date 06/01/20      PT SHORT TERM GOAL #2   Title Pt will initiate HEP focusing on vestibular and neck exercises    Time 4    Period Weeks    Status New    Target Date 06/01/20      PT SHORT TERM GOAL #3   Title Pt will report 25% reduction in frequency of dizziness episodes on a daily basis    Time 4    Period Weeks    Status New    Target Date 06/01/20             PT Long Term Goals - 05/02/20 2203      PT LONG TERM GOAL #1   Title Pt will demonstrate independence with final HEP     Time 8    Period Weeks    Status New    Target Date 07/01/20      PT LONG TERM GOAL #2   Title Pt will increase DPS to >/= 59 and DFS to >/= 51    Baseline DPS: 52; DFS: 46.2    Time 8    Period Weeks    Status New    Target Date 07/01/20      PT LONG TERM GOAL #3   Title Pt will increase pain free cervical spine ROM by 10 degrees in all directions    Baseline TBD    Time 8    Period Weeks    Status New    Target Date 07/01/20      PT LONG TERM GOAL #4   Title Pt will report 75% reduction in symptoms on a daily basis    Time 8    Period Weeks    Status New    Target Date 07/01/20      PT LONG TERM GOAL #5   Title Vestibular goal as needed                 Plan - 05/20/20 1647    Clinical Impression Statement Discussed differential diagnosis with patient and likelihood that POC would likely remain the same.  Negative resutls for superior canal involvement.  Able to progress VOR x1 due to mild provocation of symptoms.  Pt came to clinic with a headache and was more sensitive to head turns, so modification of repetitions and intensity were needed.    Personal Factors and Comorbidities Comorbidity 1;Fitness;Past/Current Experience;Time since onset of injury/illness/exacerbation    Comorbidities h/o R foot/ankle fracture with surgery and tendon repair    Examination-Activity Limitations Locomotion Level;Sleep;Stand    Examination-Participation Restrictions Cleaning;Community Activity;Driving;Meal Prep;Occupation;Shop    Stability/Clinical Decision Making Evolving/Moderate complexity    Rehab Potential Good    PT Frequency 2x / week    PT Duration 8 weeks    PT Treatment/Interventions ADLs/Self  Care Home Management;Aquatic Therapy;Canalith Repostioning;Cryotherapy;Electrical Stimulation;Moist Heat;Gait training;Stair training;Functional mobility training;Patient/family education;Therapeutic activities;Therapeutic exercise;Balance training;Neuromuscular  re-education;Manual techniques;Passive range of motion;Dry needling;Vestibular    PT Next Visit Plan Pt leaves for road trip this week so mindful of symptom provocation.  VOR - challenge time.  Corner balancing - head turns, EC, EC with head turns.  ROM - would suboccipital release help?  TDN eventually?    Consulted and Agree with Plan of Care Patient           Patient will benefit from skilled therapeutic intervention in order to improve the following deficits and impairments:  Decreased balance,Decreased range of motion,Difficulty walking,Dizziness,Pain,Decreased activity tolerance  Visit Diagnosis: Dizziness and giddiness  Cervicalgia  Unsteadiness on feet     Problem List Patient Active Problem List   Diagnosis Date Noted  . Dizziness 04/07/2020  . Cervicalgia 04/07/2020    Brooke Dare, SPT 05/20/2020, 5:04 PM  [place] [facility] [address] [place], [place], [address] Phone: [phone]   Fax:  [phone]  Name: Chaniece Barbato MRN: [id number] Date of Birth: Nov 05, 1970

## 2020-05-21 ENCOUNTER — Encounter: Payer: Self-pay | Admitting: Physical Therapy

## 2020-05-21 ENCOUNTER — Ambulatory Visit: Payer: 59 | Admitting: Physical Therapy

## 2020-05-21 DIAGNOSIS — M542 Cervicalgia: Secondary | ICD-10-CM

## 2020-05-21 DIAGNOSIS — R42 Dizziness and giddiness: Secondary | ICD-10-CM | POA: Diagnosis not present

## 2020-05-21 NOTE — Therapy (Signed)
[facility] [address] [place], [place], [address] Phone: [phone]   Fax:  [phone]  Physical Therapy Treatment  Patient Details  Name: Veronica Hunter MRN: [id number] Date of Birth: Nov 18, 1970 Referring Provider (PT): Anson Fret, MD   Encounter Date: 05/21/2020   PT End of Session - 05/21/20 1115    Visit Number 6    Number of Visits 17    Date for PT Re-Evaluation 07/01/20    Authorization Type UHC; VL: 30    PT Start Time 1109    PT Stop Time 1157    PT Time Calculation (min) 48 min    Activity Tolerance Patient tolerated treatment well    Behavior During Therapy [facility] for tasks assessed/performed           Past Medical History:  Diagnosis Date  . H/O fracture 2020   left foot, right ankle    Past Surgical History:  Procedure Laterality Date  . GALLBLADDER SURGERY  1996  . right ankle fracture surgery  2020   2 plates/10 screws   . right ankle tendon repair  2010  . TONSILLECTOMY  1996  . TOTAL VAGINAL HYSTERECTOMY  2009    There were no vitals filed for this visit.   Subjective Assessment - 05/21/20 1110    Subjective Pt reports increased tightness in neck and shoulders due to sleeping "harder" than usual.  She ended up sleeping on her side longer than typical so tightness was developed.  She also reports doing a quick jerky movement in the car that increased tightness in her neck as well.    Pertinent History h/o R foot/ankle fracture with surgery and tendon repair, getting ready to have plates/screw removed, GERD, panic/anxiety    Diagnostic tests ENT; attempted to have MRI yesterday but not able to complete    Patient Stated Goals Try to figure out what the root cause of the symptoms are and not make it worse    Pain Onset More than a month ago                             [facility] Adult PT Treatment/Exercise - 05/21/20 1502      Exercises   Exercises Neck    Other  Exercises  Breathing exercises for relaxation.  Perfromed in supine with ec.  assessment of self and tension.  4x 4 count breathing.  Verbal cueing for counts and realxation of shoulder.      Neck Exercises: Theraband   Scapula Retraction 10 reps   no theraband, with 3 second hold at end range.   Scapula Retraction Limitations Sitting edge of mat.  Required verbal and tactile cueing to perform.  Compensations made with shoulder elevation.    Shoulder Extension 10 reps   yellow theraband, 3 sec hold at end range, scapular retraction then shoulder ext.   Shoulder Extension Limitations sitting edge of mat.  Verbal and tactile cues needed.  Unable to perform wihtout shoulder elevation without tactile cueing from therapist      Neck Exercises: Supine   Neck Retraction 10 reps   3 sec hold at end range     Manual Therapy   Manual Therapy Soft tissue mobilization;Passive ROM;Manual Traction    Soft tissue mobilization Sub occipital release, trigger point release to cervical paraspinal musculature, active release cervical paraspinal    Passive ROM R/L lateral sidebend with shoulder stabilization, R/L SCm with shoulder  stabilization    Manual Traction cervical traction                  PT Education - 05/21/20 1251    Education Details Educated about taking rest breaks for her upcoming road trip, relaxation breathing, future session of DN    Person(s) Educated Patient    Methods Explanation    Comprehension Verbalized understanding            PT Short Term Goals - 05/02/20 2201      PT SHORT TERM GOAL #1   Title Pt will tolerate full assessment of cervical ROM and vestibular assessment    Baseline not able to tolerate on first day    Time 4    Period Weeks    Status New    Target Date 06/01/20      PT SHORT TERM GOAL #2   Title Pt will initiate HEP focusing on vestibular and neck exercises    Time 4    Period Weeks    Status New    Target Date 06/01/20      PT SHORT TERM  GOAL #3   Title Pt will report 25% reduction in frequency of dizziness episodes on a daily basis    Time 4    Period Weeks    Status New    Target Date 06/01/20             PT Long Term Goals - 05/02/20 2203      PT LONG TERM GOAL #1   Title Pt will demonstrate independence with final HEP    Time 8    Period Weeks    Status New    Target Date 07/01/20      PT LONG TERM GOAL #2   Title Pt will increase DPS to >/= 59 and DFS to >/= 51    Baseline DPS: 52; DFS: 46.2    Time 8    Period Weeks    Status New    Target Date 07/01/20      PT LONG TERM GOAL #3   Title Pt will increase pain free cervical spine ROM by 10 degrees in all directions    Baseline TBD    Time 8    Period Weeks    Status New    Target Date 07/01/20      PT LONG TERM GOAL #4   Title Pt will report 75% reduction in symptoms on a daily basis    Time 8    Period Weeks    Status New    Target Date 07/01/20      PT LONG TERM GOAL #5   Title Vestibular goal as needed                 Plan - 05/21/20 1252    Clinical Impression Statement Due to significant tightness in neck, upper traps musculature, pt being very guarded with movement and hesitant about neck pain todays session included manual muscule release, diaphramatic and relaxation breathing and postural strengthening exercises.  Trigger point tenderness noted R cervical paraspinals.  Pt tolerated only light pressure and positional releases.  Began postural strengtening exercises but tactile cues are required to prevent excessive shoulder elevation.  Vestibular habituation exercises will be continued when appropriate and pt is able to handle increased neck motion to continue toward LTGs.    Personal Factors and Comorbidities Comorbidity 1;Fitness;Past/Current Experience;Time since onset of injury/illness/exacerbation    Comorbidities h/o R foot/ankle fracture  with surgery and tendon repair    Examination-Activity Limitations Locomotion  Level;Sleep;Stand    Examination-Participation Restrictions Cleaning;Community Activity;Driving;Meal Prep;Occupation;Shop    Stability/Clinical Decision Making Evolving/Moderate complexity    Rehab Potential Good    PT Frequency 2x / week    PT Duration 8 weeks    PT Treatment/Interventions ADLs/Self Care Home Management;Aquatic Therapy;Canalith Repostioning;Cryotherapy;Electrical Stimulation;Moist Heat;Gait training;Stair training;Functional mobility training;Patient/family education;Therapeutic activities;Therapeutic exercise;Balance training;Neuromuscular re-education;Manual techniques;Passive range of motion;Dry needling;Vestibular    PT Next Visit Plan Check symptoms following return from road trip. DN R paraspinals.    Consulted and Agree with Plan of Care Patient           Patient will benefit from skilled therapeutic intervention in order to improve the following deficits and impairments:  Decreased balance,Decreased range of motion,Difficulty walking,Dizziness,Pain,Decreased activity tolerance  Visit Diagnosis: Dizziness and giddiness  Cervicalgia     Problem List Patient Active Problem List   Diagnosis Date Noted  . Dizziness 04/07/2020  . Cervicalgia 04/07/2020    Brooke Dare, SPT 05/21/2020, 5:17 PM  [place] [facility] [address] [place], [place], [address] Phone: [phone]   Fax:  [phone]  Name: Veronica Hunter MRN: [id number] Date of Birth: Feb 13, 1971

## 2020-05-21 NOTE — Patient Instructions (Signed)
Access Code: [email or web address] URL: [email or web address] Date: 05/20/2020 Prepared by: Bufford Lope  Exercises Seated Gentle Upper Trapezius Stretch - 1 x daily - 7 x weekly - 3 sets - 3-4 deep breaths hold Seated Shoulder Rolls - 1 x daily - 7 x weekly - 1 sets - 10 reps Seated Assisted Cervical Rotation with Towel - 1 x daily - 7 x weekly - 3 sets - 5-6 reps hold Standing with Head Rotation - 1 x daily - 7 x weekly - 2 sets - 10 reps Standing with Head Nod - 1 x daily - 7 x weekly - 2 sets - 10 reps Standing Gaze Stabilization with Head Rotation - 1 x daily - 7 x weekly - 2 sets - 40 seconds hold Standing Gaze Stabilization with Head Nod - 1 x daily - 7 x weekly - 2 sets - 40 seconds hold Wide Stance with Eyes Closed - 1 x daily - 7 x weekly - 2 sets - 10 seconds hold

## 2020-05-30 ENCOUNTER — Ambulatory Visit: Payer: 59 | Admitting: Physical Therapy

## 2020-05-31 ENCOUNTER — Ambulatory Visit: Payer: 59 | Admitting: Physical Therapy

## 2020-06-04 ENCOUNTER — Ambulatory Visit: Payer: 59 | Admitting: Physical Therapy

## 2020-06-04 ENCOUNTER — Other Ambulatory Visit: Payer: Self-pay

## 2020-06-04 ENCOUNTER — Encounter: Payer: Self-pay | Admitting: Physical Therapy

## 2020-06-04 DIAGNOSIS — M542 Cervicalgia: Secondary | ICD-10-CM

## 2020-06-04 DIAGNOSIS — R2681 Unsteadiness on feet: Secondary | ICD-10-CM

## 2020-06-04 DIAGNOSIS — R42 Dizziness and giddiness: Secondary | ICD-10-CM | POA: Diagnosis not present

## 2020-06-04 DIAGNOSIS — R262 Difficulty in walking, not elsewhere classified: Secondary | ICD-10-CM

## 2020-06-04 NOTE — Patient Instructions (Signed)
Access Code: [email or web address] Access Code: [email or web address] URL: [email or web address] Date: 06/04/2020 Prepared by: Bufford Lope  Exercises Seated Gentle Upper Trapezius Stretch - 1 x daily - 7 x weekly - 3 sets - 3-4 deep breaths hold Seated Shoulder Rolls - 1 x daily - 7 x weekly - 1 sets - 10 reps Seated Assisted Cervical Rotation with Towel - 1 x daily - 7 x weekly - 3 sets - 5-6 reps hold Standing with Head Rotation - 1 x daily - 7 x weekly - 2 sets - 10 reps Standing with Head Nod - 1 x daily - 7 x weekly - 2 sets - 10 reps Standing Gaze Stabilization with Head Rotation - 1 x daily - 7 x weekly - 2 sets - 45 seconds hold Standing Gaze Stabilization with Head Nod - 1 x daily - 7 x weekly - 2 sets - 45 seconds hold Standing with Eyes Closed - 1 x daily - 7 x weekly - 2 sets - 30 second hold

## 2020-06-04 NOTE — Therapy (Signed)
[facility] [address] [place], [place], [address] Phone: [phone]   Fax:  [phone]  Physical Therapy Treatment  Patient Details  Name: Veronica Hunter MRN: [id number] Date of Birth: [birth year] Referring Provider (PT): Anson Fret, MD   Encounter Date: 06/04/2020   PT End of Session - 06/04/20 0943    Visit Number 7    Number of Visits 17    Date for PT Re-Evaluation 07/01/20    Authorization Type UHC; VL: 30    PT Start Time 0937    PT Stop Time 1021    PT Time Calculation (min) 44 min    Activity Tolerance Patient tolerated treatment well    Behavior During Therapy [facility] for tasks assessed/performed           Past Medical History:  Diagnosis Date  . H/O fracture 2020   left foot, right ankle    Past Surgical History:  Procedure Laterality Date  . GALLBLADDER SURGERY  1996  . right ankle fracture surgery  2020   2 plates/10 screws   . right ankle tendon repair  2010  . TONSILLECTOMY  1996  . TOTAL VAGINAL HYSTERECTOMY  2009    There were no vitals filed for this visit.   Subjective Assessment - 06/04/20 0939    Subjective Pt reports her allergies are really affecting her and she has a bad cough.  When she was able to ride as a passenger during her car ride she did much better.  Symptoms were able to reduce when she can rest.  Her GI doctor told her that her dizziness may be symptomatic from her GI issues.  She has an MRI and GI test ordered.    Pertinent History h/o R foot/ankle fracture with surgery and tendon repair, getting ready to have plates/screw removed, GERD, panic/anxiety    Diagnostic tests ENT; attempted to have MRI yesterday but not able to complete    Patient Stated Goals Try to figure out what the root cause of the symptoms are and not make it worse    Pain Onset More than a month ago                              Vestibular Treatment/Exercise - 06/04/20  0001      Vestibular Treatment/Exercise   Gaze Exercises X1 Viewing Horizontal;X1 Viewing Vertical      X1 Viewing Horizontal   Foot Position standing feet apart; feet together    Reps 3    Comments x30, x45; x45 - mild symptoms, no increase      X1 Viewing Vertical   Foot Position standing feet apart, feet together    Reps 3    Comments x30sec, x45 sec; x45 sec - mild symptoms - no increase              Balance Exercises - 06/04/20 0001      Balance Exercises: Standing   Standing Eyes Opened Wide (BOA);Head turns;Solid surface;Limitations   2x10 reps each   Standing Eyes Opened Limitations Moderate symptoms after horizontal head turns and needed a minute of rest before continuing.  Mild symptoms after vertical head turns    Standing Eyes Closed Wide (BOA);Narrow base of support (BOS);Solid surface;Limitations   2x60 sec   Standing Eyes Closed Limitations mild postural sway noted.  No symptoms but pt reported feeling tense after; practiced weightshifting to test limitations of balance and  reduce tension and fear of falling             PT Education - 06/04/20 1029    Education Details Three systems that contribute to balance,  postural sawy and correction is normal, updated HEP    Person(s) Educated Patient    Methods Explanation;Handout    Comprehension Verbalized understanding            PT Short Term Goals - 06/04/20 1031      PT SHORT TERM GOAL #1   Title Pt will tolerate full assessment of cervical ROM and vestibular assessment    Baseline not able to tolerate on first day    Time 4    Period Weeks    Status Achieved    Target Date 06/01/20      PT SHORT TERM GOAL #2   Title Pt will initiate HEP focusing on vestibular and neck exercises    Time 4    Period Weeks    Status Achieved    Target Date 06/01/20      PT SHORT TERM GOAL #3   Title Pt will report 25% reduction in frequency of dizziness episodes on a daily basis    Time 4    Period Weeks     Status On-going    Target Date 06/01/20             PT Long Term Goals - 05/02/20 2203      PT LONG TERM GOAL #1   Title Pt will demonstrate independence with final HEP    Time 8    Period Weeks    Status New    Target Date 07/01/20      PT LONG TERM GOAL #2   Title Pt will increase DPS to >/= 59 and DFS to >/= 51    Baseline DPS: 52; DFS: 46.2    Time 8    Period Weeks    Status New    Target Date 07/01/20      PT LONG TERM GOAL #3   Title Pt will increase pain free cervical spine ROM by 10 degrees in all directions    Baseline TBD    Time 8    Period Weeks    Status New    Target Date 07/01/20      PT LONG TERM GOAL #4   Title Pt will report 75% reduction in symptoms on a daily basis    Time 8    Period Weeks    Status New    Target Date 07/01/20      PT LONG TERM GOAL #5   Title Vestibular goal as needed                 Plan - 06/04/20 1024    Clinical Impression Statement Progressed habituation exercises with mild to moderate symptom provocation.  With visual stabilization pt is able to alleviate symptoms quicker.  Pt still increases tension during eyes closed exercises and discussion of body positioning and weighshift to determine limits of balance were incorporated to help alleviate fears of falling.  Updated HEP to incoporate more challenging exercises and will continue slowly progressing towards LTGs.    Personal Factors and Comorbidities Comorbidity 1;Fitness;Past/Current Experience;Time since onset of injury/illness/exacerbation    Comorbidities h/o R foot/ankle fracture with surgery and tendon repair    Examination-Activity Limitations Locomotion Level;Sleep;Stand    Examination-Participation Restrictions Cleaning;Community Activity;Driving;Meal Prep;Occupation;Shop    Stability/Clinical Decision Making Evolving/Moderate complexity    Rehab  Potential Good    PT Frequency 2x / week    PT Duration 8 weeks    PT Treatment/Interventions ADLs/Self  Care Home Management;Aquatic Therapy;Canalith Repostioning;Cryotherapy;Electrical Stimulation;Moist Heat;Gait training;Stair training;Functional mobility training;Patient/family education;Therapeutic activities;Therapeutic exercise;Balance training;Neuromuscular re-education;Manual techniques;Passive range of motion;Dry needling;Vestibular    PT Next Visit Plan Continue habitaution exercises.  EC balance, weightshifting during EC.  Rockerboard?  DN R paraspinals.    Consulted and Agree with Plan of Care Patient           Patient will benefit from skilled therapeutic intervention in order to improve the following deficits and impairments:  Decreased balance,Decreased range of motion,Difficulty walking,Dizziness,Pain,Decreased activity tolerance  Visit Diagnosis: Dizziness and giddiness  Cervicalgia  Unsteadiness on feet  Difficulty in walking, not elsewhere classified     Problem List Patient Active Problem List   Diagnosis Date Noted  . Dizziness 04/07/2020  . Cervicalgia 04/07/2020    Brooke Dare, SPT 06/04/2020, 10:33 AM  [facility] [address] [place], [place], [address] Phone: [phone]   Fax:  [phone]  Name: Patte Winkel MRN: [id number] Date of Birth: 12/15/1970

## 2020-06-05 ENCOUNTER — Telehealth: Payer: Self-pay | Admitting: Neurology

## 2020-06-05 NOTE — Telephone Encounter (Signed)
[employer] [facility]: [id number] (exp. 06/05/20 to 07/20/20) no sight location.

## 2020-06-05 NOTE — Telephone Encounter (Signed)
Patient called today and stated that she would like to go to [facility] in [place]. I faxed the order they will reach out to the patient to schedule.  Ph # [phone] & fax # [phone].

## 2020-06-07 ENCOUNTER — Encounter: Payer: Self-pay | Admitting: Physical Therapy

## 2020-06-07 ENCOUNTER — Ambulatory Visit: Payer: 59 | Attending: Neurology | Admitting: Physical Therapy

## 2020-06-07 ENCOUNTER — Other Ambulatory Visit: Payer: Self-pay

## 2020-06-07 DIAGNOSIS — R2681 Unsteadiness on feet: Secondary | ICD-10-CM

## 2020-06-07 DIAGNOSIS — R42 Dizziness and giddiness: Secondary | ICD-10-CM

## 2020-06-07 DIAGNOSIS — M542 Cervicalgia: Secondary | ICD-10-CM | POA: Diagnosis present

## 2020-06-07 DIAGNOSIS — R262 Difficulty in walking, not elsewhere classified: Secondary | ICD-10-CM

## 2020-06-07 NOTE — Therapy (Signed)
[facility] [address] [place], [place], [address] Phone: [phone]   Fax:  [phone]  Physical Therapy Treatment  Patient Details  Name: Veronica Hunter MRN: [id number] Date of Birth: 1970/12/21 Referring Provider (PT): Anson Fret, MD   Encounter Date: 06/07/2020   PT End of Session - 06/07/20 1022    Visit Number 8    Number of Visits 17    Date for PT Re-Evaluation 07/01/20    Authorization Type UHC; VL: 30    PT Start Time 0933    PT Stop Time 1024    PT Time Calculation (min) 51 min    Activity Tolerance Patient tolerated treatment well    Behavior During Therapy [facility] for tasks assessed/performed           Past Medical History:  Diagnosis Date  . H/O fracture 2020   left foot, right ankle    Past Surgical History:  Procedure Laterality Date  . GALLBLADDER SURGERY  1996  . right ankle fracture surgery  2020   2 plates/10 screws   . right ankle tendon repair  2010  . TONSILLECTOMY  1996  . TOTAL VAGINAL HYSTERECTOMY  2009    There were no vitals filed for this visit.   Subjective Assessment - 06/07/20 0936    Subjective Pt reports she is feel "off" today.  It began yesterday and has not improved.    Pertinent History h/o R foot/ankle fracture with surgery and tendon repair, getting ready to have plates/screw removed, GERD, panic/anxiety    Diagnostic tests ENT; attempted to have MRI yesterday but not able to complete    Patient Stated Goals Try to figure out what the root cause of the symptoms are and not make it worse    Currently in Pain? Yes    Pain Location Neck    Pain Descriptors / Indicators Tightness    Pain Onset More than a month ago                              Vestibular Treatment/Exercise - 06/07/20 1020      Vestibular Treatment/Exercise   Gaze Exercises X1 Viewing Horizontal;X1 Viewing Vertical      X1 Viewing Horizontal   Foot Position feet  together    Reps 2    Comments 2x60 sec; moderate symptoms and nausea afterfirst rep; mild symptoms following and no nausea      X1 Viewing Vertical   Foot Position feet together    Reps 2    Comments x60sec; mild symptoms              Balance Exercises - 06/07/20 1016      Balance Exercises: Standing   Standing Eyes Opened Narrow base of support (BOS);Head turns   2x10   Standing Eyes Opened Limitations Moderate symptoms after first set of horizontal; mild symtpoms after all remaining symptoms    Rockerboard Anterior/posterior;Head turns;EO;EC;Intermittent UE support;Limitations   x30 sec static stance EO, 2x30 sec weightshifting EO, 2x5 horizontal head turns EO, 2x5 vertical head turns EO, 2x20 static stance EC, 2x5 EC head turns horizontal and vertical, x10 EC horizontal and vertical   Rockerboard Limitations Intermittent UE support and therapist support to prevent LOB; no onset of symptoms, pt reports feeling stiffness in L ankle             PT Education - 06/07/20 1021    Education Details  benefit of habituation training for vestibular system, anxiety induced symptoms and heightened CNS, prognosis    Person(s) Educated Patient    Methods Explanation    Comprehension Verbalized understanding            PT Short Term Goals - 06/04/20 1031      PT SHORT TERM GOAL #1   Title Pt will tolerate full assessment of cervical ROM and vestibular assessment    Baseline not able to tolerate on first day    Time 4    Period Weeks    Status Achieved    Target Date 06/01/20      PT SHORT TERM GOAL #2   Title Pt will initiate HEP focusing on vestibular and neck exercises    Time 4    Period Weeks    Status Achieved    Target Date 06/01/20      PT SHORT TERM GOAL #3   Title Pt will report 25% reduction in frequency of dizziness episodes on a daily basis    Time 4    Period Weeks    Status On-going    Target Date 06/01/20             PT Long Term Goals - 05/02/20  2203      PT LONG TERM GOAL #1   Title Pt will demonstrate independence with final HEP    Time 8    Period Weeks    Status New    Target Date 07/01/20      PT LONG TERM GOAL #2   Title Pt will increase DPS to >/= 59 and DFS to >/= 51    Baseline DPS: 52; DFS: 46.2    Time 8    Period Weeks    Status New    Target Date 07/01/20      PT LONG TERM GOAL #3   Title Pt will increase pain free cervical spine ROM by 10 degrees in all directions    Baseline TBD    Time 8    Period Weeks    Status New    Target Date 07/01/20      PT LONG TERM GOAL #4   Title Pt will report 75% reduction in symptoms on a daily basis    Time 8    Period Weeks    Status New    Target Date 07/01/20      PT LONG TERM GOAL #5   Title Vestibular goal as needed                 Plan - 06/07/20 1107    Clinical Impression Statement Pt still presents with increased symptoms following horizontal head turns and vestibular habituation exercises on solid ground.  Incorporated the rockerboard into todays session and pt reported no onset of dizziness and nausea.  Slight postural sway noted during all rockerboard exercises and pt required intermittent therapist and UE assist to prevent LOB.    Personal Factors and Comorbidities Comorbidity 1;Fitness;Past/Current Experience;Time since onset of injury/illness/exacerbation    Comorbidities h/o R foot/ankle fracture with surgery and tendon repair    Examination-Activity Limitations Locomotion Level;Sleep;Stand    Examination-Participation Restrictions Cleaning;Community Activity;Driving;Meal Prep;Occupation;Shop    Stability/Clinical Decision Making Evolving/Moderate complexity    Rehab Potential Good    PT Frequency 2x / week    PT Duration 8 weeks    PT Treatment/Interventions ADLs/Self Care Home Management;Aquatic Therapy;Canalith Repostioning;Cryotherapy;Electrical Stimulation;Moist Heat;Gait training;Stair training;Functional mobility  training;Patient/family education;Therapeutic activities;Therapeutic exercise;Balance training;Neuromuscular re-education;Manual  techniques;Passive range of motion;Dry needling;Vestibular    PT Next Visit Plan DN neck paraspinals. Incorporating dynamic recovery after habituation exercises. Continue habitaution exercises.  EC balance, weightshifting during EC, rockerboard, physioball    Consulted and Agree with Plan of Care Patient           Patient will benefit from skilled therapeutic intervention in order to improve the following deficits and impairments:  Decreased balance,Decreased range of motion,Difficulty walking,Dizziness,Pain,Decreased activity tolerance  Visit Diagnosis: Dizziness and giddiness  Cervicalgia  Unsteadiness on feet  Difficulty in walking, not elsewhere classified     Problem List Patient Active Problem List   Diagnosis Date Noted  . Dizziness 04/07/2020  . Cervicalgia 04/07/2020    Brooke Dare, SPT 06/07/2020, 11:11 AM  [facility] [address] [place], [place], [address] Phone: [phone]   Fax:  [phone]  Name: Veronica Hunter MRN: [id number] Date of Birth: [birth year]

## 2020-06-11 ENCOUNTER — Encounter: Payer: Self-pay | Admitting: Physical Therapy

## 2020-06-11 ENCOUNTER — Other Ambulatory Visit: Payer: Self-pay

## 2020-06-11 ENCOUNTER — Ambulatory Visit: Payer: 59 | Admitting: Physical Therapy

## 2020-06-11 DIAGNOSIS — R42 Dizziness and giddiness: Secondary | ICD-10-CM

## 2020-06-11 DIAGNOSIS — R2681 Unsteadiness on feet: Secondary | ICD-10-CM

## 2020-06-11 NOTE — Patient Instructions (Addendum)
Gaze Stabilization: Standing Feet Together ° ° ° °Feet together, keeping eyes on target on wall ___3_ feet away, tilt head down 15-30° and move head side to side for __60__ seconds. Repeat while moving head up and down for ___60_ seconds. °Do __2__ sessions per day. ° ° °

## 2020-06-12 NOTE — Therapy (Signed)
[facility] [address] [place], [place], [address] Phone: [phone]   Fax:  [phone]  Physical Therapy Treatment  Patient Details  Name: Veronica Hunter MRN: [id number] Date of Birth: 07-30-1970 Referring Provider (PT): Anson Fret, MD   Encounter Date: 06/11/2020   PT End of Session - 06/12/20 1013    Visit Number 9    Number of Visits 17    Date for PT Re-Evaluation 07/01/20    Authorization Type UHC; VL: 30    PT Start Time 0934    PT Stop Time 1014    PT Time Calculation (min) 40 min    Activity Tolerance Patient tolerated treatment well    Behavior During Therapy [facility] for tasks assessed/performed           Past Medical History:  Diagnosis Date  . H/O fracture 2020   left foot, right ankle    Past Surgical History:  Procedure Laterality Date  . GALLBLADDER SURGERY  1996  . right ankle fracture surgery  2020   2 plates/10 screws   . right ankle tendon repair  2010  . TONSILLECTOMY  1996  . TOTAL VAGINAL HYSTERECTOMY  2009    There were no vitals filed for this visit.   Subjective Assessment - 06/11/20 0939    Subjective Pt reports not feeling any worse than last session; but no better.  Would like to wait on dry needling, is going to a concert tonight with daughter.  Did have some acid reflux this weekend with dizziness. Forgot to take Pepcid this morning.    Pertinent History h/o R foot/ankle fracture with surgery and tendon repair, getting ready to have plates/screw removed, GERD, panic/anxiety    Diagnostic tests ENT; attempted to have MRI yesterday but not able to complete    Patient Stated Goals Try to figure out what the root cause of the symptoms are and not make it worse    Currently in Pain? No/denies    Pain Onset More than a month ago                              Vestibular Treatment/Exercise - 06/11/20 0956      Vestibular Treatment/Exercise   Vestibular  Treatment Provided Gaze;Habituation    Habituation Exercises 180 degree Turns    Gaze Exercises X1 Viewing Horizontal;X1 Viewing Vertical      180 degree Turns   Number of Reps  8    Symptom Description  placed 4 cones on floor on R and L; pt cued to reach down and pick up cone, reach across body to put on counter and then perform 180 turn to repeat with another cone x 8 reps; then cued to reverse and place cones back on floor.  Pt reporting mild symptoms after first set, moderate after second set with onset of mild HA and nausea.      X1 Viewing Horizontal   Foot Position feet apart, feet together solid surface    Reps 2    Comments 60 seconds, mild symptoms      X1 Viewing Vertical   Foot Position feet apart, feet together solid surface    Reps 2    Comments 60 seconds, mild symptoms.  After performing gaze stabilization performed two laps around gym - no significant decrease in symptoms and felt like the world has to catch up to her.  PT Education - 06/12/20 1013    Education Details continued to review recommendations for self monitoring of symptoms when performing adaptation and habituation exercises.  Updated VOR on HEP    Person(s) Educated Patient    Methods Explanation    Comprehension Verbalized understanding          Gaze Stabilization: Standing Feet Together    Feet together, keeping eyes on target on wall __3__ feet away, tilt head down 15-30 and move head side to side for __60__ seconds. Repeat while moving head up and down for __60__ seconds. Do __2__ sessions per day. .     PT Short Term Goals - 06/04/20 1031      PT SHORT TERM GOAL #1   Title Pt will tolerate full assessment of cervical ROM and vestibular assessment    Baseline not able to tolerate on first day    Time 4    Period Weeks    Status Achieved    Target Date 06/01/20      PT SHORT TERM GOAL #2   Title Pt will initiate HEP focusing on vestibular and neck exercises     Time 4    Period Weeks    Status Achieved    Target Date 06/01/20      PT SHORT TERM GOAL #3   Title Pt will report 25% reduction in frequency of dizziness episodes on a daily basis    Time 4    Period Weeks    Status On-going    Target Date 06/01/20             PT Long Term Goals - 05/02/20 2203      PT LONG TERM GOAL #1   Title Pt will demonstrate independence with final HEP    Time 8    Period Weeks    Status New    Target Date 07/01/20      PT LONG TERM GOAL #2   Title Pt will increase DPS to >/= 59 and DFS to >/= 51    Baseline DPS: 52; DFS: 46.2    Time 8    Period Weeks    Status New    Target Date 07/01/20      PT LONG TERM GOAL #3   Title Pt will increase pain free cervical spine ROM by 10 degrees in all directions    Baseline TBD    Time 8    Period Weeks    Status New    Target Date 07/01/20      PT LONG TERM GOAL #4   Title Pt will report 75% reduction in symptoms on a daily basis    Time 8    Period Weeks    Status New    Target Date 07/01/20      PT LONG TERM GOAL #5   Title Vestibular goal as needed                 Plan - 06/12/20 1016    Clinical Impression Statement Pt demonstrated improved tolerance of VOR training today with mild symptoms; able to progress time and foot position to increase balance challenge on HEP.  Combined bending, reaching and 180 deg turns for habituation with pt reporting onset of HA and nausea; will likely need to progress habituation training more gradually and focus on single movements initially.    Personal Factors and Comorbidities Comorbidity 1;Fitness;Past/Current Experience;Time since onset of injury/illness/exacerbation    Comorbidities h/o R foot/ankle fracture with surgery  and tendon repair    Examination-Activity Limitations Locomotion Level;Sleep;Stand    Examination-Participation Restrictions Cleaning;Community Activity;Driving;Meal Prep;Occupation;Shop    Stability/Clinical Decision Making  Evolving/Moderate complexity    Rehab Potential Good    PT Frequency 2x / week    PT Duration 8 weeks    PT Treatment/Interventions ADLs/Self Care Home Management;Aquatic Therapy;Canalith Repostioning;Cryotherapy;Electrical Stimulation;Moist Heat;Gait training;Stair training;Functional mobility training;Patient/family education;Therapeutic activities;Therapeutic exercise;Balance training;Neuromuscular re-education;Manual techniques;Passive range of motion;Dry needling;Vestibular    PT Next Visit Plan DN neck muscles. Progress VOR balance. Continue habitaution exercises but may need to focus on single movement before combining movements.  EC balance, weightshifting during EC, rockerboard, physioball    Consulted and Agree with Plan of Care Patient           Patient will benefit from skilled therapeutic intervention in order to improve the following deficits and impairments:  Decreased balance,Decreased range of motion,Difficulty walking,Dizziness,Pain,Decreased activity tolerance  Visit Diagnosis: Dizziness and giddiness  Unsteadiness on feet     Problem List Patient Active Problem List   Diagnosis Date Noted  . Dizziness 04/07/2020  . Cervicalgia 04/07/2020    Dierdre Highman, PT, DPT 06/12/20    10:21 AM    [facility] [address] [place], [place], [address] Phone: [phone]   Fax:  [phone]  Name: Lalisa Kiehn MRN: [id number] Date of Birth: 02-04-1971

## 2020-06-13 ENCOUNTER — Encounter: Payer: Self-pay | Admitting: Physical Therapy

## 2020-06-13 ENCOUNTER — Other Ambulatory Visit: Payer: Self-pay

## 2020-06-13 ENCOUNTER — Ambulatory Visit: Payer: 59 | Admitting: Physical Therapy

## 2020-06-13 DIAGNOSIS — M542 Cervicalgia: Secondary | ICD-10-CM

## 2020-06-13 DIAGNOSIS — R262 Difficulty in walking, not elsewhere classified: Secondary | ICD-10-CM

## 2020-06-13 DIAGNOSIS — R2681 Unsteadiness on feet: Secondary | ICD-10-CM

## 2020-06-13 DIAGNOSIS — R42 Dizziness and giddiness: Secondary | ICD-10-CM | POA: Diagnosis not present

## 2020-06-13 NOTE — Patient Instructions (Signed)
Gaze Stabilization: Standing Feet Apart (Compliant Surface)    Feet apart on pillow, keeping eyes on target on wall __3__ feet away, tilt head down 15-30 and move head side to side for __60__ seconds. Repeat while moving head up and down for __60__ seconds. Do __2__ sessions per day.   Access Code: [email or web address] URL: [email or web address] Date: 06/04/2020 Prepared by: Bufford Lope  Exercises Seated Gentle Upper Trapezius Stretch - 1 x daily - 7 x weekly - 3 sets - 3-4 deep breaths hold Seated Shoulder Rolls - 1 x daily - 7 x weekly - 1 sets - 10 reps Seated Assisted Cervical Rotation with Towel - 1 x daily - 7 x weekly - 3 sets - 5-6 reps hold Standing with Head Rotation - 1 x daily - 7 x weekly - 2 sets - 10 reps Standing with Head Nod - 1 x daily - 7 x weekly - 2 sets - 10 reps Standing Gaze Stabilization with Head Rotation - 1 x daily - 7 x weekly - 2 sets - 45 seconds hold Standing Gaze Stabilization with Head Nod - 1 x daily - 7 x weekly - 2 sets - 45 seconds hold Standing with Eyes Closed - 1 x daily - 7 x weekly - 2 sets - 30 second hold

## 2020-06-13 NOTE — Therapy (Signed)
[facility] [address] [place], [place], [address] Phone: [phone]   Fax:  [phone]  Physical Therapy Treatment  Patient Details  Name: Veronica Hunter MRN: [id number] Date of Birth: April 09, 1970 Referring Provider (PT): Anson Fret, MD   Encounter Date: 06/13/2020   PT End of Session - 06/13/20 0944    Visit Number 10    Number of Visits 17    Date for PT Re-Evaluation 07/01/20    Authorization Type UHC; VL: 30    PT Start Time 0930    PT Stop Time 1015    PT Time Calculation (min) 45 min    Activity Tolerance Patient tolerated treatment well    Behavior During Therapy [facility] for tasks assessed/performed           Past Medical History:  Diagnosis Date  . H/O fracture 2020   left foot, right ankle    Past Surgical History:  Procedure Laterality Date  . GALLBLADDER SURGERY  1996  . right ankle fracture surgery  2020   2 plates/10 screws   . right ankle tendon repair  2010  . TONSILLECTOMY  1996  . TOTAL VAGINAL HYSTERECTOMY  2009    There were no vitals filed for this visit.   Subjective Assessment - 06/13/20 0932    Subjective Pt reports her symptoms were not bad at the concernt she went to, she put in ear plugs and that helped.  Had a new patient visit yesterday and there are no concerns right now.  She has been more conscious of what she has been eating but last night she ate more spicy food last night and forgot to take her Pepcid before.  She is feeling dizziness symptoms this morning and is anxious because of it.  Her neck is tense today.    Pertinent History h/o R foot/ankle fracture with surgery and tendon repair, getting ready to have plates/screw removed, GERD, panic/anxiety    Diagnostic tests ENT; attempted to have MRI yesterday but not able to complete    Patient Stated Goals Try to figure out what the root cause of the symptoms are and not make it worse    Currently in Pain? Yes     Pain Location Neck    Pain Descriptors / Indicators Tightness    Pain Onset --                              Vestibular Treatment/Exercise - 06/13/20 0001      Vestibular Treatment/Exercise   Vestibular Treatment Provided Gaze    Gaze Exercises X1 Viewing Horizontal;X1 Viewing Vertical      X1 Viewing Horizontal   Foot Position feet together solid surface, feet apart on pillow    Reps 2    Comments 60 seconds; mild symptoms on pillow      X1 Viewing Vertical   Foot Position feet together solid surface    Reps 2    Comments x60 seconds; mild symptoms on pillow              Balance Exercises - 06/13/20 1137      Balance Exercises: Standing   Lift / Chop Right;Left;Limitations   x4 bending forward to pick up cone from floor, 2x4 bending and rotating to L/R to pick up cone from floor   Lift / Chop Limitations No significant increase in symptoms; pt was required an extended pause between each repetition  in standing position    Other Standing Exercises 2x4 90 degree turns placing cones on counter at midlevel.  One LOB noted during turning.  Mild symptom onset and mild headache beginning when initiating R turn first             PT Education - 06/13/20 1141    Education Details Benefits and purpose of DN, regression of exercises for only mild symptom provocation, updated HEP    Person(s) Educated Patient    Methods Explanation    Comprehension Verbalized understanding            PT Short Term Goals - 06/04/20 1031      PT SHORT TERM GOAL #1   Title Pt will tolerate full assessment of cervical ROM and vestibular assessment    Baseline not able to tolerate on first day    Time 4    Period Weeks    Status Achieved    Target Date 06/01/20      PT SHORT TERM GOAL #2   Title Pt will initiate HEP focusing on vestibular and neck exercises    Time 4    Period Weeks    Status Achieved    Target Date 06/01/20      PT SHORT TERM GOAL #3   Title Pt  will report 25% reduction in frequency of dizziness episodes on a daily basis    Time 4    Period Weeks    Status On-going    Target Date 06/01/20             PT Long Term Goals - 05/02/20 2203      PT LONG TERM GOAL #1   Title Pt will demonstrate independence with final HEP    Time 8    Period Weeks    Status New    Target Date 07/01/20      PT LONG TERM GOAL #2   Title Pt will increase DPS to >/= 59 and DFS to >/= 51    Baseline DPS: 52; DFS: 46.2    Time 8    Period Weeks    Status New    Target Date 07/01/20      PT LONG TERM GOAL #3   Title Pt will increase pain free cervical spine ROM by 10 degrees in all directions    Baseline TBD    Time 8    Period Weeks    Status New    Target Date 07/01/20      PT LONG TERM GOAL #4   Title Pt will report 75% reduction in symptoms on a daily basis    Time 8    Period Weeks    Status New    Target Date 07/01/20      PT LONG TERM GOAL #5   Title Vestibular goal as needed                 Plan - 06/13/20 1142    Clinical Impression Statement Regressed exercises from last session to monitor symptom onset.  Incorporated bending and turning habituation exercises with most symptom onset during 90 degree turns.  Symptoms were mild with only minor beginnings of a headache.  Will continue gradual progression of habituation and gaze stability exercises to prevent significant symtpoms and continue towards LTGs.    Personal Factors and Comorbidities Comorbidity 1;Fitness;Past/Current Experience;Time since onset of injury/illness/exacerbation    Comorbidities h/o R foot/ankle fracture with surgery and tendon repair    Examination-Activity Limitations  Locomotion Level;Sleep;Stand    Examination-Participation Restrictions Cleaning;Community Activity;Driving;Meal Prep;Occupation;Shop    Stability/Clinical Decision Making Evolving/Moderate complexity    Rehab Potential Good    PT Frequency 2x / week    PT Duration 8 weeks    PT  Treatment/Interventions ADLs/Self Care Home Management;Aquatic Therapy;Canalith Repostioning;Cryotherapy;Electrical Stimulation;Moist Heat;Gait training;Stair training;Functional mobility training;Patient/family education;Therapeutic activities;Therapeutic exercise;Balance training;Neuromuscular re-education;Manual techniques;Passive range of motion;Dry needling;Vestibular    PT Next Visit Plan DN neck muscles. Progress VOR balance. Single movement habituation exercises    Consulted and Agree with Plan of Care Patient           Patient will benefit from skilled therapeutic intervention in order to improve the following deficits and impairments:  Decreased balance,Decreased range of motion,Difficulty walking,Dizziness,Pain,Decreased activity tolerance  Visit Diagnosis: Dizziness and giddiness  Unsteadiness on feet  Cervicalgia  Difficulty in walking, not elsewhere classified     Problem List Patient Active Problem List   Diagnosis Date Noted  . Dizziness 04/07/2020  . Cervicalgia 04/07/2020    Brooke Dare, SPT 06/13/2020, 11:47 AM  [place] [facility] [address] [place], [place], [address] Phone: [phone]   Fax:  [phone]  Name: Veria Stradley MRN: [id number] Date of Birth: 10/19/1970

## 2020-06-18 ENCOUNTER — Ambulatory Visit: Payer: 59 | Admitting: Physical Therapy

## 2020-06-20 ENCOUNTER — Ambulatory Visit: Payer: 59 | Admitting: Physical Therapy

## 2020-07-01 ENCOUNTER — Ambulatory Visit: Payer: 59

## 2020-07-01 ENCOUNTER — Other Ambulatory Visit: Payer: Self-pay

## 2020-07-01 DIAGNOSIS — R42 Dizziness and giddiness: Secondary | ICD-10-CM | POA: Diagnosis not present

## 2020-07-01 DIAGNOSIS — R262 Difficulty in walking, not elsewhere classified: Secondary | ICD-10-CM

## 2020-07-01 DIAGNOSIS — R2681 Unsteadiness on feet: Secondary | ICD-10-CM

## 2020-07-01 DIAGNOSIS — M542 Cervicalgia: Secondary | ICD-10-CM
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
## 2020-07-02 ENCOUNTER — Ambulatory Visit: Payer: 59 | Admitting: Physical Therapy

## 2020-07-02 ENCOUNTER — Other Ambulatory Visit: Payer: Self-pay | Admitting: Family Medicine

## 2020-07-02 DIAGNOSIS — I6523 Occlusion and stenosis of bilateral carotid arteries: Secondary | ICD-10-CM

## 2020-07-04 ENCOUNTER — Ambulatory Visit: Payer: 59 | Admitting: Physical Therapy

## 2020-07-05 ENCOUNTER — Ambulatory Visit: Payer: 59

## 2020-07-05 ENCOUNTER — Other Ambulatory Visit: Payer: Self-pay

## 2020-07-05 DIAGNOSIS — R262 Difficulty in walking, not elsewhere classified: Secondary | ICD-10-CM

## 2020-07-05 DIAGNOSIS — M542 Cervicalgia: Secondary | ICD-10-CM

## 2020-07-05 DIAGNOSIS — R42 Dizziness and giddiness: Secondary | ICD-10-CM | POA: Diagnosis not present

## 2020-07-05 DIAGNOSIS — R2681 Unsteadiness on feet: Secondary | ICD-10-CM
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
## 2020-07-10 ENCOUNTER — Other Ambulatory Visit: Payer: Self-pay

## 2020-07-10 ENCOUNTER — Telehealth: Payer: Self-pay | Admitting: *Deleted

## 2020-07-10 ENCOUNTER — Ambulatory Visit: Payer: 59 | Attending: Neurology

## 2020-07-10 DIAGNOSIS — R42 Dizziness and giddiness: Secondary | ICD-10-CM

## 2020-07-10 DIAGNOSIS — R2681 Unsteadiness on feet: Secondary | ICD-10-CM | POA: Insufficient documentation

## 2020-07-10 DIAGNOSIS — M25572 Pain in left ankle and joints of left foot: Secondary | ICD-10-CM | POA: Insufficient documentation

## 2020-07-10 DIAGNOSIS — R262 Difficulty in walking, not elsewhere classified: Secondary | ICD-10-CM | POA: Insufficient documentation

## 2020-07-10 DIAGNOSIS — M542 Cervicalgia: Secondary | ICD-10-CM | POA: Insufficient documentation

## 2020-07-10 NOTE — Telephone Encounter (Signed)
Received MRI head report from 07/04/20 via fax (ordered by Dr Lucia Gaskins).   Impression: No cause of the presenting symptoms is identified.  Normal study with exception of a few scattered foci of T2 and FLAIR signal in the cerebral hemispheric white matter consistent with an early manifestation of small vessel change.  Report signed by Dr Paulina Fusi on 07/04/20 12:08

## 2020-07-10 NOTE — Telephone Encounter (Signed)
MRI of the brain unremarkable please let her know thanks

## 2020-07-10 NOTE — Telephone Encounter (Signed)
I spoke with patient and discussed that her MRI of the brain is unremarkable, no concerns.  Patient verbalized understanding and wanted to let Dr. Lucia Gaskins know that she has actually noticed some improvement (although slow) in her symptoms with physical therapy.  She has discussed dry needling with PT and may proceed with this.  Patient's questions were answered.  She will plan to proceed with current plan of care and if she changes her mind about any invasive treatments or if she worsens or doesn't improve she will call us back.

## 2020-07-10 NOTE — Therapy (Signed)
[facility] [address] [place], [place], [address] Phone: [phone]   Fax:  [phone]  Physical Therapy Treatment  Patient Details  Name: Veronica Hunter MRN: [id number] Date of Birth: 04/01/1970 Referring Provider (PT): Anson Fret, MD   Encounter Date: 07/10/2020   PT End of Session - 07/10/20 0847    Visit Number 12   no charge visit   Number of Visits 17    Date for PT Re-Evaluation 07/01/20    Authorization Type UHC; VL: 30    PT Start Time 0900    PT Stop Time 0930    PT Time Calculation (min) 30 min    Activity Tolerance Patient tolerated treatment well    Behavior During Therapy [facility] for tasks assessed/performed           Past Medical History:  Diagnosis Date  . H/O fracture 2020   left foot, right ankle    Past Surgical History:  Procedure Laterality Date  . GALLBLADDER SURGERY  1996  . right ankle fracture surgery  2020   2 plates/10 screws   . right ankle tendon repair  2010  . TONSILLECTOMY  1996  . TOTAL VAGINAL HYSTERECTOMY  2009    There were no vitals filed for this visit.     Pt reports her foot surgeon wants to see her for he prescribes PT but she won't be able to get appt until early June. Upon further discussion, pt didn't have any significant manual therapy done on her ankle after her sugery and she exhibits significant joint restrictions in rear and mid foot on R. Pt educated that we can try conservative PT with manual therapy and exercises to see if we can improve mobility in her footand reduce pain prior to her potential surgery. Pt agreed. Pt was educated to reach out to her PCP for referral to add ankle pain.  Pt was educaged on how pick shoes with good mid foot and rear foot support.  Pt agreed with above discussion and POC. No charge for todays session.                           PT Short Term Goals - 06/04/20 1031      PT SHORT TERM GOAL #1    Title Pt will tolerate full assessment of cervical ROM and vestibular assessment    Baseline not able to tolerate on first day    Time 4    Period Weeks    Status Achieved    Target Date 06/01/20      PT SHORT TERM GOAL #2   Title Pt will initiate HEP focusing on vestibular and neck exercises    Time 4    Period Weeks    Status Achieved    Target Date 06/01/20      PT SHORT TERM GOAL #3   Title Pt will report 25% reduction in frequency of dizziness episodes on a daily basis    Time 4    Period Weeks    Status On-going    Target Date 06/01/20             PT Long Term Goals - 05/02/20 2203      PT LONG TERM GOAL #1   Title Pt will demonstrate independence with final HEP    Time 8    Period Weeks    Status New    Target Date 07/01/20  PT LONG TERM GOAL #2   Title Pt will increase DPS to >/= 59 and DFS to >/= 51    Baseline DPS: 52; DFS: 46.2    Time 8    Period Weeks    Status New    Target Date 07/01/20      PT LONG TERM GOAL #3   Title Pt will increase pain free cervical spine ROM by 10 degrees in all directions    Baseline TBD    Time 8    Period Weeks    Status New    Target Date 07/01/20      PT LONG TERM GOAL #4   Title Pt will report 75% reduction in symptoms on a daily basis    Time 8    Period Weeks    Status New    Target Date 07/01/20      PT LONG TERM GOAL #5   Title Vestibular goal as needed                  Patient will benefit from skilled therapeutic intervention in order to improve the following deficits and impairments:     Visit Diagnosis: Unsteadiness on feet  Dizziness and giddiness  Cervicalgia  Difficulty in walking, not elsewhere classified     Problem List Patient Active Problem List   Diagnosis Date Noted  . Dizziness 04/07/2020  . Cervicalgia 04/07/2020    Ileana Ladd 07/10/2020, 11:59 AM  [facility] [address] [place], [place], [address] Phone: [phone]   Fax:  [phone]  Name: Veronica Hunter MRN: [id number] Date of Birth: 1970/08/17

## 2020-07-16 ENCOUNTER — Ambulatory Visit
Admission: RE | Admit: 2020-07-16 | Discharge: 2020-07-16 | Disposition: A | Discharge status: Home / Self Care | Payer: 59 | Source: Ambulatory Visit | Attending: Family Medicine | Admitting: Family Medicine

## 2020-07-16 DIAGNOSIS — I6523 Occlusion and stenosis of bilateral carotid arteries: Secondary | ICD-10-CM

## 2020-07-17 ENCOUNTER — Ambulatory Visit: Payer: 59

## 2020-07-17 ENCOUNTER — Telehealth: Payer: Self-pay | Admitting: Neurology

## 2020-07-17 ENCOUNTER — Other Ambulatory Visit: Payer: Self-pay

## 2020-07-17 DIAGNOSIS — M542 Cervicalgia: Secondary | ICD-10-CM

## 2020-07-17 DIAGNOSIS — R262 Difficulty in walking, not elsewhere classified: Secondary | ICD-10-CM | POA: Diagnosis present

## 2020-07-17 DIAGNOSIS — M25572 Pain in left ankle and joints of left foot: Secondary | ICD-10-CM

## 2020-07-17 DIAGNOSIS — R42 Dizziness and giddiness: Secondary | ICD-10-CM | POA: Diagnosis present

## 2020-07-17 DIAGNOSIS — R2681 Unsteadiness on feet: Secondary | ICD-10-CM | POA: Diagnosis present

## 2020-07-17 NOTE — Addendum Note (Signed)
Addended by: Ileana Ladd on: 07/17/2020 12:15 PM   Modules accepted: Orders

## 2020-07-17 NOTE — Therapy (Signed)
[facility] [address] [place], [place], [address] Phone: [phone]   Fax:  [phone]  Physical Therapy Evaluation  Patient Details  Name: Veronica Hunter MRN: [id number] Date of Birth: 05-Oct-1970 Referring Provider (PT): Dr. Toni Arthurs   Encounter Date: 07/17/2020   PT End of Session - 07/17/20 1024    Visit Number 13    Number of Visits 21    Date for PT Re-Evaluation 07/01/20    Authorization Type UHC; VL: 30 (Ankle pain PT started as of 07/17/20)    PT Start Time 1020    PT Stop Time 1100    PT Time Calculation (min) 40 min    Activity Tolerance Patient tolerated treatment well    Behavior During Therapy [facility] for tasks assessed/performed           Past Medical History:  Diagnosis Date  . H/O fracture 2020   left foot, right ankle    Past Surgical History:  Procedure Laterality Date  . GALLBLADDER SURGERY  1996  . right ankle fracture surgery  2020   2 plates/10 screws   . right ankle tendon repair  2010  . TONSILLECTOMY  1996  . TOTAL VAGINAL HYSTERECTOMY  2009    There were no vitals filed for this visit.    Subjective Assessment - 07/17/20 1025    Subjective Pt had a fracture of R ankle and had surgery done Nov 2020 where she has plates and screws in her ankle. Pt reports ankle pain can flare up her neck pain and dizziness and she feels that her ankle pain is mostly limiting her during the day.    Pertinent History h/o R foot/ankle fracture with surgery and tendon repair, getting ready to have plates/screw removed, GERD, panic/anxiety    Diagnostic tests ENT; attempted to have MRI yesterday but not able to complete    Patient Stated Goals Try to figure out what the root cause of the symptoms are and not make it worse              [facility] PT Assessment - 07/17/20 1028      ROM / Strength   AROM / PROM / Strength AROM      AROM   AROM Assessment Site Ankle    Right/Left Ankle  Right;Left    Right Ankle Dorsiflexion 5    Right Ankle Plantar Flexion 25    Right Ankle Inversion -3    Right Ankle Eversion 15    Left Ankle Dorsiflexion 15    Left Ankle Plantar Flexion 55    Left Ankle Inversion 45    Left Ankle Eversion 30              Manual therapy: Grade I-IIrear foot inversion and eversion tilts Grade I-II mid foot pronation  Grade I-II forefoot pronation Grade I-II subtalar AP mobilization  Neuro Re-ed Desensitization with wash cloth to lateral ankle  TherEx: Supine unilateral hooklying: ankle inversion, eversion, dorsiflexion and plantarflexion: 20x each; dorsilfexed foot with eversion and inversion: 10x  HEP: Added supine hooklying ankle ROM exercises           PT Short Term Goals - 07/17/20 1210      PT SHORT TERM GOAL #1   Title Pt will tolerate full assessment of cervical ROM and vestibular assessment    Baseline not able to tolerate on first day    Time 4    Period Weeks    Status Achieved  Target Date 06/01/20      PT SHORT TERM GOAL #2   Title Pt will initiate HEP focusing on vestibular and neck exercises    Time 4    Period Weeks    Status Achieved    Target Date 06/01/20      PT SHORT TERM GOAL #3   Title Pt will report 25% reduction in frequency of dizziness episodes on a daily basis    Time 4    Period Weeks    Status On-going    Target Date 06/01/20      PT SHORT TERM GOAL #4   Title Pt will dem 0 deg of ankle inversion to improve foot mechanics during gait    Baseline 07/17/20: lacks 3 deg to neutral for inversion    Time 2    Period Weeks    Status New    Target Date 07/31/20             PT Long Term Goals - 07/17/20 1211      PT LONG TERM GOAL #1   Title Pt will demonstrate independence with final HEP    Time 6    Period Weeks    Status New    Target Date 08/28/20      PT LONG TERM GOAL #2   Title Pt will increase DPS to >/= 59 and DFS to >/= 51    Baseline DPS: 52; DFS: 46.2    Time  6    Period Weeks    Status New    Target Date 08/28/20      PT LONG TERM GOAL #3   Title Pt will increase pain free cervical spine ROM by 10 degrees in all directions    Baseline TBD    Time 6    Period Weeks    Status New    Target Date 08/28/20      PT LONG TERM GOAL #4   Title Pt will report 75% reduction in symptoms on a daily basis    Time 6    Period Weeks    Status New    Target Date 08/28/20      PT LONG TERM GOAL #5   Title Pt will demo 15 deg of ankle DF to improve dorsiflexion during late stance of gait    Baseline 07/17/20: 5 deg    Time 6    Period Weeks    Status New    Target Date 08/28/20                  Plan - 07/17/20 1206    Clinical Impression Statement Patient has been seen for total of 13 sessions for vertigo and dizziness. Patient also reports of significant ankle pain that affects her mobility and daily function significantly. We evaluated her for ankle pain today and added diagnosis, and goals to her POC. 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 Patient will beenfit froms killed PT to add ankle treatment along with her vertigo and dizziness symptoms to improve overall function.    Personal Factors and Comorbidities Comorbidity 1;Fitness;Past/Current Experience;Time since onset of injury/illness/exacerbation    Comorbidities h/o R foot/ankle fracture with surgery and tendon repair    Examination-Activity Limitations Locomotion Level;Sleep;Stand    Examination-Participation Restrictions Cleaning;Community Activity;Driving;Meal Prep;Occupation;Shop    Stability/Clinical Decision Making Evolving/Moderate complexity    Clinical Decision Making Moderate  Rehab Potential Good    PT Frequency 2x / week    PT Duration 4 weeks    PT Treatment/Interventions ADLs/Self Care Home Management;Aquatic Therapy;Canalith  Repostioning;Cryotherapy;Electrical Stimulation;Moist Heat;Gait training;Stair training;Functional mobility training;Patient/family education;Therapeutic activities;Therapeutic exercise;Balance training;Neuromuscular re-education;Manual techniques;Passive range of motion;Dry needling;Vestibular;Taping;Joint Manipulations    PT Next Visit Plan work on ankle mobility    Consulted and Agree with Plan of Care Patient           Patient will benefit from skilled therapeutic intervention in order to improve the following deficits and impairments:  Decreased balance,Decreased range of motion,Difficulty walking,Dizziness,Pain,Decreased activity tolerance,Abnormal gait,Decreased endurance,Decreased mobility,Decreased scar mobility,Decreased skin integrity,Decreased strength,Hypomobility,Increased fascial restricitons,Increased muscle spasms,Impaired flexibility,Impaired sensation  Visit Diagnosis: Dizziness and giddiness  Unsteadiness on feet  Cervicalgia  Difficulty in walking, not elsewhere classified  Pain in left ankle and joints of left foot     Problem List Patient Active Problem List   Diagnosis Date Noted  . Dizziness 04/07/2020  . Cervicalgia 04/07/2020    Ileana Ladd, PT 07/17/2020, 12:12 PM  [facility] [address] [place], [place], [address] Phone: [phone]   Fax:  [phone]  Name: Veronica Hunter MRN: [id number] Date of Birth: 11/17/1970

## 2020-07-17 NOTE — Telephone Encounter (Signed)
MRI of the brain unremarkable, thanks

## 2020-07-17 NOTE — Telephone Encounter (Signed)
See phone note from 07/10/20. Pt aware of MRI results.
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
## 2020-07-19 ENCOUNTER — Ambulatory Visit: Payer: 59

## 2020-07-19 ENCOUNTER — Other Ambulatory Visit: Payer: Self-pay

## 2020-07-19 DIAGNOSIS — R2681 Unsteadiness on feet: Secondary | ICD-10-CM | POA: Diagnosis not present

## 2020-07-19 DIAGNOSIS — M25572 Pain in left ankle and joints of left foot: Secondary | ICD-10-CM

## 2020-07-19 DIAGNOSIS — R262 Difficulty in walking, not elsewhere classified: Secondary | ICD-10-CM

## 2020-07-19 DIAGNOSIS — R42 Dizziness and giddiness: Secondary | ICD-10-CM

## 2020-07-19 DIAGNOSIS — M542 Cervicalgia: Secondary | ICD-10-CM

## 2020-07-19 NOTE — Therapy (Signed)
HEP: Added supine hooklying ankle [facility] [address] [place], [place], [address] Phone: [phone]   Fax:  [phone]  Physical Therapy Treatment  Patient Details  Name: Veronica Hunter MRN: [id number] Date of Birth: Jul 25, 1970 Referring Provider (PT): Anson Fret, MD   Encounter Date: 07/19/2020   PT End of Session - 07/19/20 0935    Visit Number 14    Number of Visits 21    Date for PT Re-Evaluation 07/01/20    Authorization Type UHC; VL: 30 (Ankle pain PT started as of 07/17/20)    PT Start Time 0930    PT Stop Time 1015    PT Time Calculation (min) 45 min    Activity Tolerance Patient tolerated treatment well    Behavior During Therapy [facility] for tasks assessed/performed           Past Medical History:  Diagnosis Date  . H/O fracture 2020   left foot, right ankle    Past Surgical History:  Procedure Laterality Date  . GALLBLADDER SURGERY  1996  . right ankle fracture surgery  2020   2 plates/10 screws   . right ankle tendon repair  2010  . TONSILLECTOMY  1996  . TOTAL VAGINAL HYSTERECTOMY  2009    There were no vitals filed for this visit.   Subjective Assessment - 07/19/20 0938    Subjective Pt reports I was able to lay on my R side longer last couple of nights. Pain has moved from my foot to my hips and last couple of days my shoulders have been bothing more and feeling more dizzy.    Pertinent History h/o R foot/ankle fracture with surgery and tendon repair, getting ready to have plates/screw removed, GERD, panic/anxiety    Diagnostic tests ENT; attempted to have MRI yesterday but not able to complete    Patient Stated Goals Try to figure out what the root cause of the symptoms are and not make it worse    Currently in Pain? Yes    Pain Score 6     Pain Location Ankle    Pain Orientation Right    Pain Descriptors / Indicators Stabbing;Pressure    Pain Onset More than  a month ago    Pain Frequency Constant              [facility] PT Assessment - 07/19/20 0952      AROM   Right Ankle Dorsiflexion 10    Right Ankle Plantar Flexion 25    Right Ankle Inversion 5    Right Ankle Eversion 15              Manual therapy: Grade I-IIrear foot inversion and eversion tilts Grade I-II mid foot pronation  Grade I-II forefoot pronation Grade I-II subtalar AP mobilization  Neuro Re-ed Desensitization with wash cloth to lateral ankle  TherEx: Supine unilateral hooklying: ankle inversion, eversion, dorsiflexion and plantarflexion: 20x each; dorsilfexed foot with eversion and inversion: 10x  Supine leg straight: manually resisted ankle DF, PF, inversion and eversion: 10x Neuro Re-ed: closed chain ankle inversion and eversion with manual reisstance with hip abduction and adduction and approximation through tibia for WB: pt in supine hooklying Toe scunches: 20x                     PT Short Term Goals - 07/17/20 1210      PT SHORT TERM GOAL #1   Title Pt will tolerate  full assessment of cervical ROM and vestibular assessment    Baseline not able to tolerate on first day    Time 4    Period Weeks    Status Achieved    Target Date 06/01/20      PT SHORT TERM GOAL #2   Title Pt will initiate HEP focusing on vestibular and neck exercises    Time 4    Period Weeks    Status Achieved    Target Date 06/01/20      PT SHORT TERM GOAL #3   Title Pt will report 25% reduction in frequency of dizziness episodes on a daily basis    Time 4    Period Weeks    Status On-going    Target Date 06/01/20      PT SHORT TERM GOAL #4   Title Pt will dem 0 deg of ankle inversion to improve foot mechanics during gait    Baseline 07/17/20: lacks 3 deg to neutral for inversion    Time 2    Period Weeks    Status New    Target Date 07/31/20             PT Long Term Goals - 07/17/20 1211      PT LONG TERM GOAL #1   Title Pt will demonstrate  independence with final HEP    Time 6    Period Weeks    Status New    Target Date 08/28/20      PT LONG TERM GOAL #2   Title Pt will increase DPS to >/= 59 and DFS to >/= 51    Baseline DPS: 52; DFS: 46.2    Time 6    Period Weeks    Status New    Target Date 08/28/20      PT LONG TERM GOAL #3   Title Pt will increase pain free cervical spine ROM by 10 degrees in all directions    Baseline TBD    Time 6    Period Weeks    Status New    Target Date 08/28/20      PT LONG TERM GOAL #4   Title Pt will report 75% reduction in symptoms on a daily basis    Time 6    Period Weeks    Status New    Target Date 08/28/20      PT LONG TERM GOAL #5   Title Pt will demo 15 deg of ankle DF to improve dorsiflexion during late stance of gait    Baseline 07/17/20: 5 deg    Time 6    Period Weeks    Status New    Target Date 08/28/20                 Plan - 07/19/20 1004    Clinical Impression Statement Pt's ankle ROM is significantly improving. She is probabaly feeling compensatory pain from changing gait pattern as her ankle ROM is gradually improving. Patient was educated that she will feel symptoms change as her ROM is gradually improving. Patient is reporting improving sleep as result of improving ankle pain as well.    Personal Factors and Comorbidities Comorbidity 1;Fitness;Past/Current Experience;Time since onset of injury/illness/exacerbation    Comorbidities h/o R foot/ankle fracture with surgery and tendon repair    Examination-Activity Limitations Locomotion Level;Sleep;Stand    Examination-Participation Restrictions Cleaning;Community Activity;Driving;Meal Prep;Occupation;Shop    Stability/Clinical Decision Making Evolving/Moderate complexity    Rehab Potential Good    PT Frequency  2x / week    PT Duration 4 weeks    PT Treatment/Interventions ADLs/Self Care Home Management;Aquatic Therapy;Canalith Repostioning;Cryotherapy;Electrical Stimulation;Moist Heat;Gait  training;Stair training;Functional mobility training;Patient/family education;Therapeutic activities;Therapeutic exercise;Balance training;Neuromuscular re-education;Manual techniques;Passive range of motion;Dry needling;Vestibular;Taping;Joint Manipulations    PT Next Visit Plan work on ankle mobility    Consulted and Agree with Plan of Care Patient           Patient will benefit from skilled therapeutic intervention in order to improve the following deficits and impairments:  Decreased balance,Decreased range of motion,Difficulty walking,Dizziness,Pain,Decreased activity tolerance,Abnormal gait,Decreased endurance,Decreased mobility,Decreased scar mobility,Decreased skin integrity,Decreased strength,Hypomobility,Increased fascial restricitons,Increased muscle spasms,Impaired flexibility,Impaired sensation  Visit Diagnosis: Dizziness and giddiness  Unsteadiness on feet  Cervicalgia  Difficulty in walking, not elsewhere classified  Pain in left ankle and joints of left foot     Problem List Patient Active Problem List   Diagnosis Date Noted  . Dizziness 04/07/2020  . Cervicalgia 04/07/2020    Ileana Ladd. PT 07/19/2020, 10:05 AM  [facility] [address] [place], [place], [address] Phone: [phone]   Fax:  [phone]  Name: Veronica Hunter MRN: [id number] Date of Birth: [birth year]

## 2020-07-24 ENCOUNTER — Ambulatory Visit: Payer: 59

## 2020-07-24 ENCOUNTER — Other Ambulatory Visit: Payer: Self-pay

## 2020-07-24 DIAGNOSIS — M25572 Pain in left ankle and joints of left foot: Secondary | ICD-10-CM

## 2020-07-24 DIAGNOSIS — R2681 Unsteadiness on feet: Secondary | ICD-10-CM | POA: Diagnosis not present

## 2020-07-24 DIAGNOSIS — M542 Cervicalgia: Secondary | ICD-10-CM

## 2020-07-24 DIAGNOSIS — R42 Dizziness and giddiness: Secondary | ICD-10-CM

## 2020-07-24 DIAGNOSIS — R262 Difficulty in walking, not elsewhere classified: Secondary | ICD-10-CM

## 2020-07-24 NOTE — Therapy (Signed)
[facility] [address] [place], [place], [address] Phone: [phone]   Fax:  [phone]  Physical Therapy Treatment  Patient Details  Name: Veronica Hunter MRN: [id number] Date of Birth: [birth year] Referring Provider (PT): Anson Fret, MD   Encounter Date: 07/24/2020   PT End of Session - 07/24/20 0854    Visit Number 15    Number of Visits 21    Date for PT Re-Evaluation 07/01/20    Authorization Type UHC; VL: 30 (Ankle pain PT started as of 07/17/20)    PT Start Time 0850    PT Stop Time 0930    PT Time Calculation (min) 40 min    Activity Tolerance Patient tolerated treatment well    Behavior During Therapy [facility] for tasks assessed/performed           Past Medical History:  Diagnosis Date  . H/O fracture 2020   left foot, right ankle    Past Surgical History:  Procedure Laterality Date  . GALLBLADDER SURGERY  1996  . right ankle fracture surgery  2020   2 plates/10 screws   . right ankle tendon repair  2010  . TONSILLECTOMY  1996  . TOTAL VAGINAL HYSTERECTOMY  2009    There were no vitals filed for this visit.   Subjective Assessment - 07/24/20 0855    Subjective Pt reports R ankle is sore and stiff right now. I had to rush to get here so I feel very rushed right now.    Pertinent History h/o R foot/ankle fracture with surgery and tendon repair, getting ready to have plates/screw removed, GERD, panic/anxiety    Diagnostic tests ENT; attempted to have MRI yesterday but not able to complete    Patient Stated Goals Try to figure out what the root cause of the symptoms are and not make it worse    Currently in Pain? No/denies    Pain Score 6     Pain Location Ankle    Pain Orientation Right    Pain Descriptors / Indicators Aching;Sore    Pain Onset More than a month ago              [facility] PT Assessment - 07/24/20 0920      AROM   Right Ankle Dorsiflexion 15    Right Ankle Plantar  Flexion 30    Right Ankle Inversion 5    Right Ankle Eversion 20                 Manual therapy: Grade I-IIrear foot inversion and eversion tilts Grade I-II mid foot pronation  Grade I-II forefoot pronation Grade I-II subtalar AP mobilization Subtalar traction mobiliation with towel  Neuro Re-ed Desensitization with wash cloth to lateral ankle  TherEx: Manually resisted ankle DF and PF: 3 x 20                        PT Short Term Goals - 07/17/20 1210      PT SHORT TERM GOAL #1   Title Pt will tolerate full assessment of cervical ROM and vestibular assessment    Baseline not able to tolerate on first day    Time 4    Period Weeks    Status Achieved    Target Date 06/01/20      PT SHORT TERM GOAL #2   Title Pt will initiate HEP focusing on vestibular and neck exercises    Time 4  Period Weeks    Status Achieved    Target Date 06/01/20      PT SHORT TERM GOAL #3   Title Pt will report 25% reduction in frequency of dizziness episodes on a daily basis    Time 4    Period Weeks    Status On-going    Target Date 06/01/20      PT SHORT TERM GOAL #4   Title Pt will dem 0 deg of ankle inversion to improve foot mechanics during gait    Baseline 07/17/20: lacks 3 deg to neutral for inversion    Time 2    Period Weeks    Status New    Target Date 07/31/20             PT Long Term Goals - 07/17/20 1211      PT LONG TERM GOAL #1   Title Pt will demonstrate independence with final HEP    Time 6    Period Weeks    Status New    Target Date 08/28/20      PT LONG TERM GOAL #2   Title Pt will increase DPS to >/= 59 and DFS to >/= 51    Baseline DPS: 52; DFS: 46.2    Time 6    Period Weeks    Status New    Target Date 08/28/20      PT LONG TERM GOAL #3   Title Pt will increase pain free cervical spine ROM by 10 degrees in all directions    Baseline TBD    Time 6    Period Weeks    Status New    Target Date 08/28/20      PT LONG  TERM GOAL #4   Title Pt will report 75% reduction in symptoms on a daily basis    Time 6    Period Weeks    Status New    Target Date 08/28/20      PT LONG TERM GOAL #5   Title Pt will demo 15 deg of ankle DF to improve dorsiflexion during late stance of gait    Baseline 07/17/20: 5 deg    Time 6    Period Weeks    Status New    Target Date 08/28/20                 Plan - 07/24/20 0856    Clinical Impression Statement Pt's resting foot position was at -10 eversion compared to -15 deg eversion. Pt reports she is tolerating more activities in WB position. Pt has made about 5 deg of improvement grossly in her R ankle since recent ankle evaluation.    Personal Factors and Comorbidities Comorbidity 1;Fitness;Past/Current Experience;Time since onset of injury/illness/exacerbation    Comorbidities h/o R foot/ankle fracture with surgery and tendon repair    Examination-Activity Limitations Locomotion Level;Sleep;Stand    Examination-Participation Restrictions Cleaning;Community Activity;Driving;Meal Prep;Occupation;Shop    Stability/Clinical Decision Making Evolving/Moderate complexity    Rehab Potential Good    PT Frequency 2x / week    PT Duration 4 weeks    PT Treatment/Interventions ADLs/Self Care Home Management;Aquatic Therapy;Canalith Repostioning;Cryotherapy;Electrical Stimulation;Moist Heat;Gait training;Stair training;Functional mobility training;Patient/family education;Therapeutic activities;Therapeutic exercise;Balance training;Neuromuscular re-education;Manual techniques;Passive range of motion;Dry needling;Vestibular;Taping;Joint Manipulations    PT Next Visit Plan work on ankle mobility    Consulted and Agree with Plan of Care Patient           Patient will benefit from skilled therapeutic intervention in order to improve the  following deficits and impairments:  Decreased balance,Decreased range of motion,Difficulty walking,Dizziness,Pain,Decreased activity  tolerance,Abnormal gait,Decreased endurance,Decreased mobility,Decreased scar mobility,Decreased skin integrity,Decreased strength,Hypomobility,Increased fascial restricitons,Increased muscle spasms,Impaired flexibility,Impaired sensation  Visit Diagnosis: Dizziness and giddiness  Unsteadiness on feet  Cervicalgia  Difficulty in walking, not elsewhere classified  Pain in left ankle and joints of left foot     Problem List Patient Active Problem List   Diagnosis Date Noted  . Dizziness 04/07/2020  . Cervicalgia 04/07/2020    Ileana Ladd, PT 07/24/2020, 9:36 AM  [facility] [address] [place], [place], [address] Phone: [phone]   Fax:  [phone]  Name: Veronica Hunter MRN: [id number] Date of Birth: 05-09-1970

## 2020-07-26 ENCOUNTER — Ambulatory Visit: Payer: 59

## 2020-07-26 ENCOUNTER — Other Ambulatory Visit: Payer: Self-pay

## 2020-07-26 DIAGNOSIS — R2681 Unsteadiness on feet: Secondary | ICD-10-CM | POA: Diagnosis not present

## 2020-07-26 DIAGNOSIS — R262 Difficulty in walking, not elsewhere classified: Secondary | ICD-10-CM

## 2020-07-26 DIAGNOSIS — M542 Cervicalgia: Secondary | ICD-10-CM

## 2020-07-26 DIAGNOSIS — R42 Dizziness and giddiness: Secondary | ICD-10-CM

## 2020-07-26 DIAGNOSIS — M25572 Pain in left ankle and joints of left foot: Secondary | ICD-10-CM

## 2020-07-26 NOTE — Therapy (Signed)
[facility] [address] [place], [place], [address] Phone: [phone]   Fax:  [phone]  Physical Therapy Treatment  Patient Details  Name: Veronica Hunter MRN: [id number] Date of Birth: 1970-12-11 Referring Provider (PT): Anson Fret, MD   Encounter Date: 07/26/2020   PT End of Session - 07/26/20 1047    Visit Number 16    Number of Visits 21    Date for PT Re-Evaluation 07/01/20    Authorization Type UHC; VL: 30 (Ankle pain PT started as of 07/17/20)    Activity Tolerance Patient tolerated treatment well    Behavior During Therapy [facility] for tasks assessed/performed           Past Medical History:  Diagnosis Date  . H/O fracture 2020   left foot, right ankle    Past Surgical History:  Procedure Laterality Date  . GALLBLADDER SURGERY  1996  . right ankle fracture surgery  2020   2 plates/10 screws   . right ankle tendon repair  2010  . TONSILLECTOMY  1996  . TOTAL VAGINAL HYSTERECTOMY  2009    There were no vitals filed for this visit.   Subjective Assessment - 07/26/20 1057    Subjective I had a breakthrough with my dizziness. I am able to bend down much further and I am not getting same intensity of dizziness and sometimes I am not getting that all. My ankle was sore for couple fo days after last time but it is feeling better now.    Pertinent History h/o R foot/ankle fracture with surgery and tendon repair, getting ready to have plates/screw removed, GERD, panic/anxiety    Diagnostic tests ENT; attempted to have MRI yesterday but not able to complete    Patient Stated Goals Try to figure out what the root cause of the symptoms are and not make it worse    Currently in Pain? Yes    Pain Score 3     Pain Location Ankle    Pain Orientation Right    Pain Onset More than a month ago                       Manual therapy: Grade I-IIrear foot inversion and eversion tilts Grade I-II mid  foot pronation  Grade I-II forefoot pronation Grade I-II subtalar AP mobilization Subtalar traction mobiliation with towel  Neuro Re-ed Desensitization with wash cloth to lateral ankle  TherEx: Manually resisted ankle DF and PF: 3 x 20  R sidelying AA ankle inversion: 30x Supin manually resisted ankle inversion/eversion :20x Standing: keeping neutral weight through bottom of foot with equal distribution through medial/lateral ball of foot and between forefoot and rearfoot: - discussed with patient to do this at home for HEP - wide BOS with holding on to countertop: 2' - wide BOS without holding onto countertop: 1' - wide BOS without holding onto countertop: 1', EC                              PT Short Term Goals - 07/17/20 1210      PT SHORT TERM GOAL #1   Title Pt will tolerate full assessment of cervical ROM and vestibular assessment    Baseline not able to tolerate on first day    Time 4    Period Weeks    Status Achieved    Target Date 06/01/20  PT SHORT TERM GOAL #2   Title Pt will initiate HEP focusing on vestibular and neck exercises    Time 4    Period Weeks    Status Achieved    Target Date 06/01/20      PT SHORT TERM GOAL #3   Title Pt will report 25% reduction in frequency of dizziness episodes on a daily basis    Time 4    Period Weeks    Status On-going    Target Date 06/01/20      PT SHORT TERM GOAL #4   Title Pt will dem 0 deg of ankle inversion to improve foot mechanics during gait    Baseline 07/17/20: lacks 3 deg to neutral for inversion    Time 2    Period Weeks    Status New    Target Date 07/31/20             PT Long Term Goals - 07/17/20 1211      PT LONG TERM GOAL #1   Title Pt will demonstrate independence with final HEP    Time 6    Period Weeks    Status New    Target Date 08/28/20      PT LONG TERM GOAL #2   Title Pt will increase DPS to >/= 59 and DFS to >/= 51    Baseline DPS: 52; DFS: 46.2     Time 6    Period Weeks    Status New    Target Date 08/28/20      PT LONG TERM GOAL #3   Title Pt will increase pain free cervical spine ROM by 10 degrees in all directions    Baseline TBD    Time 6    Period Weeks    Status New    Target Date 08/28/20      PT LONG TERM GOAL #4   Title Pt will report 75% reduction in symptoms on a daily basis    Time 6    Period Weeks    Status New    Target Date 08/28/20      PT LONG TERM GOAL #5   Title Pt will demo 15 deg of ankle DF to improve dorsiflexion during late stance of gait    Baseline 07/17/20: 5 deg    Time 6    Period Weeks    Status New    Target Date 08/28/20                 Plan - 07/26/20 1058    Clinical Impression Statement Pt's resting foot inversion was -4 deg today compared to -10 last session. Overall ankle ROM is improving.    Personal Factors and Comorbidities Comorbidity 1;Fitness;Past/Current Experience;Time since onset of injury/illness/exacerbation    Comorbidities h/o R foot/ankle fracture with surgery and tendon repair    Examination-Activity Limitations Locomotion Level;Sleep;Stand    Examination-Participation Restrictions Cleaning;Community Activity;Driving;Meal Prep;Occupation;Shop    Stability/Clinical Decision Making Evolving/Moderate complexity    Rehab Potential Good    PT Frequency 2x / week    PT Duration 4 weeks    PT Treatment/Interventions ADLs/Self Care Home Management;Aquatic Therapy;Canalith Repostioning;Cryotherapy;Electrical Stimulation;Moist Heat;Gait training;Stair training;Functional mobility training;Patient/family education;Therapeutic activities;Therapeutic exercise;Balance training;Neuromuscular re-education;Manual techniques;Passive range of motion;Dry needling;Vestibular;Taping;Joint Manipulations    PT Next Visit Plan work on ankle mobility    Consulted and Agree with Plan of Care Patient           Patient will benefit from skilled therapeutic intervention in  order to  improve the following deficits and impairments:  Decreased balance,Decreased range of motion,Difficulty walking,Dizziness,Pain,Decreased activity tolerance,Abnormal gait,Decreased endurance,Decreased mobility,Decreased scar mobility,Decreased skin integrity,Decreased strength,Hypomobility,Increased fascial restricitons,Increased muscle spasms,Impaired flexibility,Impaired sensation  Visit Diagnosis: Dizziness and giddiness  Unsteadiness on feet  Cervicalgia  Difficulty in walking, not elsewhere classified  Pain in left ankle and joints of left foot     Problem List Patient Active Problem List   Diagnosis Date Noted  . Dizziness 04/07/2020  . Cervicalgia 04/07/2020    Ileana Ladd, PT 07/26/2020, 10:58 AM  [facility] [address] [place], [place], [address] Phone: [phone]   Fax:  [phone]  Name: Bethanne Mule MRN: [id number] Date of Birth: [birth year]

## 2020-07-29 ENCOUNTER — Other Ambulatory Visit: Payer: Self-pay

## 2020-07-29 ENCOUNTER — Ambulatory Visit: Payer: 59

## 2020-07-29 DIAGNOSIS — R2681 Unsteadiness on feet: Secondary | ICD-10-CM | POA: Diagnosis not present

## 2020-07-29 DIAGNOSIS — M25572 Pain in left ankle and joints of left foot: Secondary | ICD-10-CM

## 2020-07-29 DIAGNOSIS — M542 Cervicalgia: Secondary | ICD-10-CM

## 2020-07-29 DIAGNOSIS — R262 Difficulty in walking, not elsewhere classified: Secondary | ICD-10-CM

## 2020-07-29 DIAGNOSIS — R42 Dizziness and giddiness: Secondary | ICD-10-CM

## 2020-07-29 NOTE — Therapy (Signed)
[facility] [address] [place], [place], [address] Phone: [phone]   Fax:  [phone]  Physical Therapy Treatment  Patient Details  Name: Veronica Hunter MRN: [id number] Date of Birth: 1970-04-19 Referring Provider (PT): Anson Fret, MD   Encounter Date: 07/29/2020   PT End of Session - 07/29/20 1057    Visit Number 17    Number of Visits 21    Date for PT Re-Evaluation 07/01/20    Authorization Type UHC; VL: 30 (Ankle pain PT started as of 07/17/20)    PT Start Time 1015    PT Stop Time 1100    PT Time Calculation (min) 45 min    Activity Tolerance Patient tolerated treatment well    Behavior During Therapy [facility] for tasks assessed/performed           Past Medical History:  Diagnosis Date  . H/O fracture 2020   left foot, right ankle    Past Surgical History:  Procedure Laterality Date  . GALLBLADDER SURGERY  1996  . right ankle fracture surgery  2020   2 plates/10 screws   . right ankle tendon repair  2010  . TONSILLECTOMY  1996  . TOTAL VAGINAL HYSTERECTOMY  2009    There were no vitals filed for this visit.   Subjective Assessment - 07/29/20 1058    Subjective I had couple of days of intermittent headaches over the weekend. OVerall syptoms are stable. She is feeling symptoms are changing.    Pertinent History h/o R foot/ankle fracture with surgery and tendon repair, getting ready to have plates/screw removed, GERD, panic/anxiety    Diagnostic tests ENT; attempted to have MRI yesterday but not able to complete    Patient Stated Goals Try to figure out what the root cause of the symptoms are and not make it worse    Currently in Pain? Yes    Pain Score 4     Pain Location Ankle    Pain Orientation Right    Pain Descriptors / Indicators Tightness;Aching    Pain Onset More than a month ago              [facility] PT Assessment - 07/29/20 0001      AROM   Right Ankle Inversion 5    Right  Ankle Eversion 20                  Manual therapy: Grade I-IIrear foot inversion and eversion tilts Grade I-II mid foot pronation  Grade I-II forefoot pronation Grade I-II subtalar AP mobilization Subtalar traction mobiliation with towel Grade I-II cuboid mobilization  Neuro Re-ed Desensitization with wash cloth to lateral ankle  TherEx: Manually resisted ankle DF and PF: 3 x 20  L sidelying: ankle inversion and eversion: 3 x 20 Seated ankle PF: 20lbs 3 x 10 L Tilt board: Round disc on bottom: ankle PF/DF/Eve/Inv: 30x each Demonstrated how to go up and down steps with keeping ball of foot at edge of step to allow for pivot when coming down steps. Used both rails: 8 steps                        PT Short Term Goals - 07/17/20 1210      PT SHORT TERM GOAL #1   Title Pt will tolerate full assessment of cervical ROM and vestibular assessment    Baseline not able to tolerate on first day    Time 4  Period Weeks    Status Achieved    Target Date 06/01/20      PT SHORT TERM GOAL #2   Title Pt will initiate HEP focusing on vestibular and neck exercises    Time 4    Period Weeks    Status Achieved    Target Date 06/01/20      PT SHORT TERM GOAL #3   Title Pt will report 25% reduction in frequency of dizziness episodes on a daily basis    Time 4    Period Weeks    Status On-going    Target Date 06/01/20      PT SHORT TERM GOAL #4   Title Pt will dem 0 deg of ankle inversion to improve foot mechanics during gait    Baseline 07/17/20: lacks 3 deg to neutral for inversion    Time 2    Period Weeks    Status New    Target Date 07/31/20             PT Long Term Goals - 07/17/20 1211      PT LONG TERM GOAL #1   Title Pt will demonstrate independence with final HEP    Time 6    Period Weeks    Status New    Target Date 08/28/20      PT LONG TERM GOAL #2   Title Pt will increase DPS to >/= 59 and DFS to >/= 51    Baseline DPS: 52; DFS:  46.2    Time 6    Period Weeks    Status New    Target Date 08/28/20      PT LONG TERM GOAL #3   Title Pt will increase pain free cervical spine ROM by 10 degrees in all directions    Baseline TBD    Time 6    Period Weeks    Status New    Target Date 08/28/20      PT LONG TERM GOAL #4   Title Pt will report 75% reduction in symptoms on a daily basis    Time 6    Period Weeks    Status New    Target Date 08/28/20      PT LONG TERM GOAL #5   Title Pt will demo 15 deg of ankle DF to improve dorsiflexion during late stance of gait    Baseline 07/17/20: 5 deg    Time 6    Period Weeks    Status New    Target Date 08/28/20                 Plan - 07/29/20 1059    Clinical Impression Statement No significant change in ankle ROM compared to last session.    Personal Factors and Comorbidities Comorbidity 1;Fitness;Past/Current Experience;Time since onset of injury/illness/exacerbation    Comorbidities h/o R foot/ankle fracture with surgery and tendon repair    Examination-Activity Limitations Locomotion Level;Sleep;Stand    Examination-Participation Restrictions Cleaning;Community Activity;Driving;Meal Prep;Occupation;Shop    Stability/Clinical Decision Making Evolving/Moderate complexity    Rehab Potential Good    PT Frequency 2x / week    PT Duration 4 weeks    PT Treatment/Interventions ADLs/Self Care Home Management;Aquatic Therapy;Canalith Repostioning;Cryotherapy;Electrical Stimulation;Moist Heat;Gait training;Stair training;Functional mobility training;Patient/family education;Therapeutic activities;Therapeutic exercise;Balance training;Neuromuscular re-education;Manual techniques;Passive range of motion;Dry needling;Vestibular;Taping;Joint Manipulations    PT Next Visit Plan work on ankle mobility    Consulted and Agree with Plan of Care Patient  Patient will benefit from skilled therapeutic intervention in order to improve the following deficits and  impairments:  Decreased balance,Decreased range of motion,Difficulty walking,Dizziness,Pain,Decreased activity tolerance,Abnormal gait,Decreased endurance,Decreased mobility,Decreased scar mobility,Decreased skin integrity,Decreased strength,Hypomobility,Increased fascial restricitons,Increased muscle spasms,Impaired flexibility,Impaired sensation  Visit Diagnosis: Unsteadiness on feet  Dizziness and giddiness  Cervicalgia  Difficulty in walking, not elsewhere classified  Pain in left ankle and joints of left foot     Problem List Patient Active Problem List   Diagnosis Date Noted  . Dizziness 04/07/2020  . Cervicalgia 04/07/2020    Ileana Ladd, PT 07/29/2020, 11:05 AM  [facility] [address] [place], [place], [address] Phone: [phone]   Fax:  [phone]  Name: Veronica Hunter MRN: [id number] Date of Birth: Nov 22, 1970

## 2020-07-30 ENCOUNTER — Ambulatory Visit: Payer: 59

## 2020-07-31 ENCOUNTER — Ambulatory Visit: Payer: 59

## 2020-08-06 ENCOUNTER — Ambulatory Visit: Payer: 59

## 2020-08-07 ENCOUNTER — Ambulatory Visit: Payer: 59

## 2020-08-08 ENCOUNTER — Ambulatory Visit: Payer: 59

## 2020-08-12 ENCOUNTER — Telehealth: Payer: Self-pay | Admitting: Neurology

## 2020-08-12 NOTE — Telephone Encounter (Signed)
Spoke with patient.  She has been seeing PT next-door.  She was scheduled with Aundra Millet NP for sooner appointment on 6/8 at 10:00 AM to discuss the worsening dizziness despite therapy visits consistently since February 2022.  Recently she had felt slow improvement in her symptoms. Her next PT appointment is scheduled for 08/16/2020. Patient verbalized appreciation for the call.

## 2020-08-12 NOTE — Telephone Encounter (Signed)
Pt states her dizziness is worsening, she'd like to know if she can f/u with NP or if she needs to accept Dr Trevor Mace next avail(which is in Oct) please call.

## 2020-08-14 ENCOUNTER — Ambulatory Visit: Payer: 59

## 2020-08-14 ENCOUNTER — Ambulatory Visit (INDEPENDENT_AMBULATORY_CARE_PROVIDER_SITE_OTHER): Payer: 59 | Admitting: Adult Health

## 2020-08-14 ENCOUNTER — Encounter: Payer: Self-pay | Admitting: Adult Health

## 2020-08-14 VITALS — BP 133/62 | HR 85 | Ht 67.0 in | Wt 236.0 lb

## 2020-08-14 DIAGNOSIS — R42 Dizziness and giddiness: Secondary | ICD-10-CM

## 2020-08-14 DIAGNOSIS — M542 Cervicalgia: Secondary | ICD-10-CM | POA: Diagnosis not present

## 2020-08-14 DIAGNOSIS — F419 Anxiety disorder, unspecified: Secondary | ICD-10-CM | POA: Diagnosis not present

## 2020-08-14 MED ORDER — TIZANIDINE HCL 2 MG PO CAPS: 2.0000 mg | ORAL_CAPSULE | Freq: Every day | ORAL | 5 refills | Status: DC
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
## 2020-08-14 NOTE — Patient Instructions (Signed)
Your Plan:  Try Zanaflex 2 mg at bedtime Speak to PCP If your symptoms worsen or you develop new symptoms please let us know.   Thank you for coming to see [place] at [facility]. I hope we have been able to provide you high quality care today.  You may receive a patient satisfaction survey over the next few weeks. We would appreciate your feedback and comments so that we may continue to improve ourselves and the health of our patients.  Tizanidine Oral Tablets or Capsules What is this medicine? TIZANIDINE (tye ZAN i deen) helps to relieve muscle spasms. It may be used in people with multiple sclerosis or spinal cord injury. This medicine may be used for other purposes; ask your health care provider or pharmacist if you have questions. COMMON BRAND NAME(S): Zanaflex What should I tell my health care provider before I take this medicine? They need to know if you have any of these conditions:  kidney disease  liver disease  low blood pressure  mental health disease  an unusual or allergic reaction to tizanidine, other medicines, foods, dyes, or preservatives  pregnant or trying to get pregnant  breast-feeding How should I use this medicine? Take this medicine by mouth with water. Take it as directed on the prescription label. You can take it with or without food. You should always take it the same way. Keep taking this medicine unless your health care provider tells you to stop. Stopping it too quickly can cause serious side effects. Talk to your health care provider about the use of this medicine in children. Special care may be needed. Patients over 26 years of age may have a stronger reaction and need a smaller dose. Overdosage: If you think you have taken too much of this medicine contact a poison control center or emergency room at once. NOTE: This medicine is only for you. Do not share this medicine with others. What if I miss a dose? If you miss a dose, take  it as soon as you can. If it is almost time for your next dose, take only that dose. Do not take double or extra doses. What may interact with this medicine? Do not take this medicine with any of the following medications:  ciprofloxacin  fluvoxamine  narcotic medicines for cough  thiabendazole  viloxazine This medicine may also interact with the following medications:  acyclovir  alcohol  antihistamines for allergy, cough, and cold  baclofen  birth control pills  certain medicines for anxiety or sleep  certain medicines for blood pressure, heart disease, irregular heartbeat like amiodarone, mexiletine, propafenone, verapamil  certain medicines for depression like amitriptyline, fluoxetine, sertraline  certain medicines for seizures like phenobarbital, primidone  cimetidine  clonidine  famotidine  guanfacine  general anesthetics like halothane, isoflurane, methoxyflurane, propofol  medicines for sleep  medicines that relax muscles for surgery  methyldopa  narcotic medicines for pain  phenothiazines like chlorpromazine, prochlorperazine, thioridazine  ticlopidine  zileuton This list may not describe all possible interactions. Give your health care provider a list of all the medicines, herbs, non-prescription drugs, or dietary supplements you use. Also tell them if you smoke, drink alcohol, or use illegal drugs. Some items may interact with your medicine. What should I watch for while using this medicine? Visit your health care provider for regular checks on your progress. Tell your health care provider if your symptoms do not start to get better or if they get worse. You may get drowsy or  dizzy. Do not drive, use machinery, or do anything that needs mental alertness until you know how this medicine affects you. Do not stand up or sit up quickly, especially if you are an older patient. This reduces the risk of dizzy or fainting spells. Alcohol may interfere  with the effects of this medicine. Avoid alcoholic drinks. Your mouth may get dry. Chewing sugarless gum or sucking hard candy and drinking plenty of water may help. Contact your health care provider if the problem does not go away or is severe. What side effects may I notice from receiving this medicine? Side effects that you should report to your doctor or health care professional as soon as possible:  allergic reactions like skin rash, itching or hives, swelling of the face, lips, or tongue  confusion  hallucinations  liver injury (dark yellow or brown urine; general ill feeling or flu-like symptoms; loss of appetite, right upper belly pain; unusually weak or tired, yellowing of the eyes or skin)  loss of contact with reality  low blood pressure (dizziness; feeling faint or lightheaded, falls; unusually weak or tired)  trouble breathing  slow or irregular heartbeat Side effects that usually do not require medical attention (report to your doctor or health care professional if they continue or are bothersome):  blurred vision  constipation  dizziness  drowsiness  dry mouth  tiredness This list may not describe all possible side effects. Call your doctor for medical advice about side effects. You may report side effects to FDA at 1-800-FDA-1088. Where should I keep my medicine? Keep out of the reach of children and pets. Store at room temperature between 15 and 30 degrees C (59 and 86 degrees F). Get rid of any unused medicine after the expiration date. To get rid of medicines that are no longer needed or have expired:  Take the medicine to a medicine take-back program. Check with your pharmacy or law enforcement to find a location.  If you cannot return the medicine, check the label or package insert to see if the medicine should be thrown out in the garbage or flushed down the toilet. If you are not sure, ask your health care provider. If it is safe to put it in the trash,  take the medicine out of the container. Mix the medicine with cat litter, dirt, coffee grounds, or other unwanted substance. Seal the mixture in a bag or container. Put it in the trash. NOTE: This sheet is a summary. It may not cover all possible information. If you have questions about this medicine, talk to your doctor, pharmacist, or health care provider.  2021 Elsevier/Gold Standard (2019-06-21 16:44:13)

## 2020-08-16 ENCOUNTER — Ambulatory Visit: Payer: 59 | Attending: Neurology

## 2020-08-16 ENCOUNTER — Other Ambulatory Visit: Payer: Self-pay

## 2020-08-16 ENCOUNTER — Ambulatory Visit: Payer: 59

## 2020-08-16 DIAGNOSIS — R262 Difficulty in walking, not elsewhere classified: Secondary | ICD-10-CM | POA: Diagnosis present

## 2020-08-16 DIAGNOSIS — R2681 Unsteadiness on feet: Secondary | ICD-10-CM | POA: Diagnosis present

## 2020-08-16 DIAGNOSIS — R42 Dizziness and giddiness: Secondary | ICD-10-CM | POA: Insufficient documentation

## 2020-08-16 DIAGNOSIS — M542 Cervicalgia: Secondary | ICD-10-CM | POA: Diagnosis present

## 2020-08-16 DIAGNOSIS — M25572 Pain in left ankle and joints of left foot: Secondary | ICD-10-CM | POA: Diagnosis present

## 2020-08-16 NOTE — Therapy (Signed)
[facility] [address] [place], [place], [address] Phone: [phone]   Fax:  [phone]  Physical Therapy Treatment  Patient Details  Name: Veronica Hunter MRN: [id number] Date of Birth: Dec 21, 1970 Referring Provider (PT): Anson Fret, MD   Encounter Date: 08/16/2020   PT End of Session - 08/16/20 1540     Visit Number 18    Number of Visits 21    Date for PT Re-Evaluation 08/28/20    Authorization Type UHC; VL: 30 (Ankle pain PT started as of 07/17/20)    PT Start Time 1145    PT Stop Time 1230    PT Time Calculation (min) 45 min    Activity Tolerance Patient tolerated treatment well    Behavior During Therapy [facility] for tasks assessed/performed             Past Medical History:  Diagnosis Date   H/O fracture 2020   left foot, right ankle    Past Surgical History:  Procedure Laterality Date   GALLBLADDER SURGERY  1996   right ankle fracture surgery  2020   2 plates/10 screws    right ankle tendon repair  2010   TONSILLECTOMY  1996   TOTAL VAGINAL HYSTERECTOMY  2009    There were no vitals filed for this visit.   Subjective Assessment - 08/16/20 1541     Subjective Pt reports ankle has been stable. I am able to do more and i doesn't hurt as bad as before. Now my shoulders are hurting more. My MD wants to try some sort of manipulatios (unsure of what exactly) with me for couple of weeks.    Pertinent History h/o R foot/ankle fracture with surgery and tendon repair, getting ready to have plates/screw removed, GERD, panic/anxiety    Diagnostic tests ENT; attempted to have MRI yesterday but not able to complete    Patient Stated Goals Try to figure out what the root cause of the symptoms are and not make it worse    Currently in Pain? Yes    Pain Location Ankle    Pain Orientation Left    Pain Descriptors / Indicators Tender    Pain Type Chronic pain    Pain Onset More than a month ago    Pain  Frequency Constant                     Manual therapy: Grade I-IIrear foot inversion and eversion tilts Grade I-II mid foot pronation  Grade I-II forefoot pronation Grade I-II subtalar AP mobilization Subtalar traction mobiliation with towel Grade I-II cuboid mobilization  TherEx: Resisted ankle Motions: DF, PF, inversion: green band: 30x each R only                         PT Short Term Goals - 07/17/20 1210       PT SHORT TERM GOAL #1   Title Pt will tolerate full assessment of cervical ROM and vestibular assessment    Baseline not able to tolerate on first day    Time 4    Period Weeks    Status Achieved    Target Date 06/01/20      PT SHORT TERM GOAL #2   Title Pt will initiate HEP focusing on vestibular and neck exercises    Time 4    Period Weeks    Status Achieved    Target Date 06/01/20  PT SHORT TERM GOAL #3   Title Pt will report 25% reduction in frequency of dizziness episodes on a daily basis    Time 4    Period Weeks    Status On-going    Target Date 06/01/20      PT SHORT TERM GOAL #4   Title Pt will dem 0 deg of ankle inversion to improve foot mechanics during gait    Baseline 07/17/20: lacks 3 deg to neutral for inversion    Time 2    Period Weeks    Status New    Target Date 07/31/20               PT Long Term Goals - 07/17/20 1211       PT LONG TERM GOAL #1   Title Pt will demonstrate independence with final HEP    Time 6    Period Weeks    Status New    Target Date 08/28/20      PT LONG TERM GOAL #2   Title Pt will increase DPS to >/= 59 and DFS to >/= 51    Baseline DPS: 52; DFS: 46.2    Time 6    Period Weeks    Status New    Target Date 08/28/20      PT LONG TERM GOAL #3   Title Pt will increase pain free cervical spine ROM by 10 degrees in all directions    Baseline TBD    Time 6    Period Weeks    Status New    Target Date 08/28/20      PT LONG TERM GOAL #4   Title Pt will  report 75% reduction in symptoms on a daily basis    Time 6    Period Weeks    Status New    Target Date 08/28/20      PT LONG TERM GOAL #5   Title Pt will demo 15 deg of ankle DF to improve dorsiflexion during late stance of gait    Baseline 07/17/20: 5 deg    Time 6    Period Weeks    Status New    Target Date 08/28/20                   Plan - 08/16/20 1542     Clinical Impression Statement ROM measured not meaured today. Patient is able to tolerate more pressure with manual therapy and is less apprehensive with movement of ankle. Pt is still guarded with plantarflexion and inversion motions mostly. We progressed ankle exercises with inreased resistance.    Personal Factors and Comorbidities Comorbidity 1;Fitness;Past/Current Experience;Time since onset of injury/illness/exacerbation    Comorbidities h/o R foot/ankle fracture with surgery and tendon repair    Examination-Activity Limitations Locomotion Level;Sleep;Stand    Examination-Participation Restrictions Cleaning;Community Activity;Driving;Meal Prep;Occupation;Shop    Stability/Clinical Decision Making Evolving/Moderate complexity    Rehab Potential Good    PT Frequency 2x / week    PT Duration 4 weeks    PT Treatment/Interventions ADLs/Self Care Home Management;Aquatic Therapy;Canalith Repostioning;Cryotherapy;Electrical Stimulation;Moist Heat;Gait training;Stair training;Functional mobility training;Patient/family education;Therapeutic activities;Therapeutic exercise;Balance training;Neuromuscular re-education;Manual techniques;Passive range of motion;Dry needling;Vestibular;Taping;Joint Manipulations    PT Next Visit Plan work on ankle mobility    Consulted and Agree with Plan of Care Patient             Patient will benefit from skilled therapeutic intervention in order to improve the following deficits and impairments:  Decreased balance, Decreased range of  motion, Difficulty walking, Dizziness, Pain,  Decreased activity tolerance, Abnormal gait, Decreased endurance, Decreased mobility, Decreased scar mobility, Decreased skin integrity, Decreased strength, Hypomobility, Increased fascial restricitons, Increased muscle spasms, Impaired flexibility, Impaired sensation  Visit Diagnosis: Unsteadiness on feet  Dizziness and giddiness  Cervicalgia  Difficulty in walking, not elsewhere classified  Pain in left ankle and joints of left foot     Problem List Patient Active Problem List   Diagnosis Date Noted   Dizziness 04/07/2020   Cervicalgia 04/07/2020    Ileana Ladd, PT 08/16/2020, 3:44 PM  [facility] [address] [place], [place], [address] Phone: [phone]   Fax:  [phone]  Name: Veronica Hunter MRN: [id number] Date of Birth: [birth year]

## 2020-08-21 ENCOUNTER — Other Ambulatory Visit: Payer: Self-pay

## 2020-08-21 ENCOUNTER — Ambulatory Visit: Payer: 59

## 2020-08-21 DIAGNOSIS — M542 Cervicalgia: Secondary | ICD-10-CM

## 2020-08-21 DIAGNOSIS — R262 Difficulty in walking, not elsewhere classified: Secondary | ICD-10-CM

## 2020-08-21 DIAGNOSIS — R42 Dizziness and giddiness: Secondary | ICD-10-CM

## 2020-08-21 DIAGNOSIS — R2681 Unsteadiness on feet: Secondary | ICD-10-CM

## 2020-08-21 DIAGNOSIS — M25572 Pain in left ankle and joints of left foot: Secondary | ICD-10-CM

## 2020-08-21 NOTE — Therapy (Signed)
[facility] [address] [place], [place], [address] Phone: [phone]   Fax:  [phone]  Physical Therapy Treatment  Patient Details  Name: Veronica Hunter MRN: [id number] Date of Birth: Jan 24, 1971 Referring Provider (PT): Anson Fret, MD   Encounter Date: 08/21/2020   PT End of Session - 08/21/20 1256     Visit Number 19    Number of Visits 21    Date for PT Re-Evaluation 08/28/20    Authorization Type UHC; VL: 30 (Ankle pain PT started as of 07/17/20)    PT Start Time 1020    PT Stop Time 1100    PT Time Calculation (min) 40 min    Activity Tolerance Patient tolerated treatment well    Behavior During Therapy [facility] for tasks assessed/performed             Past Medical History:  Diagnosis Date   H/O fracture 2020   left foot, right ankle    Past Surgical History:  Procedure Laterality Date   GALLBLADDER SURGERY  1996   right ankle fracture surgery  2020   2 plates/10 screws    right ankle tendon repair  2010   TONSILLECTOMY  1996   TOTAL VAGINAL HYSTERECTOMY  2009    There were no vitals filed for this visit.   Subjective Assessment - 08/21/20 1256     Subjective Pt reports she waslked around in stores for about 4 hours and next day she felt her ankle was very stiff and it is still stiff and she feels like she is back to square one and she is feeling increased pain in last 2-3 days in her ankle.    Pertinent History h/o R foot/ankle fracture with surgery and tendon repair, getting ready to have plates/screw removed, GERD, panic/anxiety    Diagnostic tests ENT; attempted to have MRI yesterday but not able to complete    Patient Stated Goals Try to figure out what the root cause of the symptoms are and not make it worse    Currently in Pain? Yes    Pain Score 7     Pain Location Ankle    Pain Orientation Right    Pain Descriptors / Indicators Tender;Tightness;Pressure    Pain Onset More than  a month ago                     Manual therapy: Grade I-IIrear foot inversion and eversion tilts Grade I-II mid foot pronation  Grade I-II forefoot pronation Grade I-II subtalar AP mobilization Subtalar traction mobiliation with towel Desensitization with towel over anterior and lateral ankle STM to anterior tibialis Scar tissue mobilization over anterior ankle Deep friction massage over distal anterior tibialis tendon  Manually resisted ankle DF, PF AA ankle inversion and eversion R sidelying ankle inversion  Ice cup massage for <5' pt unable to tolerate ice for more than 30 sec at a time.                         PT Short Term Goals - 07/17/20 1210       PT SHORT TERM GOAL #1   Title Pt will tolerate full assessment of cervical ROM and vestibular assessment    Baseline not able to tolerate on first day    Time 4    Period Weeks    Status Achieved    Target Date 06/01/20      PT SHORT TERM  GOAL #2   Title Pt will initiate HEP focusing on vestibular and neck exercises    Time 4    Period Weeks    Status Achieved    Target Date 06/01/20      PT SHORT TERM GOAL #3   Title Pt will report 25% reduction in frequency of dizziness episodes on a daily basis    Time 4    Period Weeks    Status On-going    Target Date 06/01/20      PT SHORT TERM GOAL #4   Title Pt will dem 0 deg of ankle inversion to improve foot mechanics during gait    Baseline 07/17/20: lacks 3 deg to neutral for inversion    Time 2    Period Weeks    Status New    Target Date 07/31/20               PT Long Term Goals - 07/17/20 1211       PT LONG TERM GOAL #1   Title Pt will demonstrate independence with final HEP    Time 6    Period Weeks    Status New    Target Date 08/28/20      PT LONG TERM GOAL #2   Title Pt will increase DPS to >/= 59 and DFS to >/= 51    Baseline DPS: 52; DFS: 46.2    Time 6    Period Weeks    Status New    Target Date 08/28/20       PT LONG TERM GOAL #3   Title Pt will increase pain free cervical spine ROM by 10 degrees in all directions    Baseline TBD    Time 6    Period Weeks    Status New    Target Date 08/28/20      PT LONG TERM GOAL #4   Title Pt will report 75% reduction in symptoms on a daily basis    Time 6    Period Weeks    Status New    Target Date 08/28/20      PT LONG TERM GOAL #5   Title Pt will demo 15 deg of ankle DF to improve dorsiflexion during late stance of gait    Baseline 07/17/20: 5 deg    Time 6    Period Weeks    Status New    Target Date 08/28/20                   Plan - 08/21/20 1258     Clinical Impression Statement Pt had increased stiffness at start of the session but was able to move her ankle better after manual therapy and exercies. Patient has swelling over dorsal aspect of foot over mid and forefoot over 1-4 metatarsals. Patient was educated on benefits of having proper foot wear if she is going to be on her feet for more than 1 hour to reduce stress on her foot and avoid soreness.    Personal Factors and Comorbidities Comorbidity 1;Fitness;Past/Current Experience;Time since onset of injury/illness/exacerbation    Comorbidities h/o R foot/ankle fracture with surgery and tendon repair    Examination-Activity Limitations Locomotion Level;Sleep;Stand    Examination-Participation Restrictions Cleaning;Community Activity;Driving;Meal Prep;Occupation;Shop    Stability/Clinical Decision Making Evolving/Moderate complexity    Rehab Potential Good    PT Frequency 2x / week    PT Duration 4 weeks    PT Treatment/Interventions ADLs/Self Care Home Management;Aquatic Therapy;Canalith Repostioning;Cryotherapy;Electrical Stimulation;Moist Heat;Gait  training;Stair training;Functional mobility training;Patient/family education;Therapeutic activities;Therapeutic exercise;Balance training;Neuromuscular re-education;Manual techniques;Passive range of motion;Dry  needling;Vestibular;Taping;Joint Manipulations    PT Next Visit Plan work on ankle mobility    Consulted and Agree with Plan of Care Patient             Patient will benefit from skilled therapeutic intervention in order to improve the following deficits and impairments:  Decreased balance, Decreased range of motion, Difficulty walking, Dizziness, Pain, Decreased activity tolerance, Abnormal gait, Decreased endurance, Decreased mobility, Decreased scar mobility, Decreased skin integrity, Decreased strength, Hypomobility, Increased fascial restricitons, Increased muscle spasms, Impaired flexibility, Impaired sensation  Visit Diagnosis: Unsteadiness on feet  Dizziness and giddiness  Cervicalgia  Difficulty in walking, not elsewhere classified  Pain in left ankle and joints of left foot     Problem List Patient Active Problem List   Diagnosis Date Noted   Dizziness 04/07/2020   Cervicalgia 04/07/2020    Ileana Ladd, PT 08/21/2020, 12:59 PM  [facility] [address] [place], [place], [address] Phone: [phone]   Fax:  [phone]  Name: Veronica Hunter MRN: [id number] Date of Birth: 06/28/1970

## 2020-08-23 ENCOUNTER — Ambulatory Visit: Payer: 59

## 2020-08-28 ENCOUNTER — Other Ambulatory Visit: Payer: Self-pay

## 2020-08-28 ENCOUNTER — Ambulatory Visit: Payer: 59

## 2020-08-28 DIAGNOSIS — M25572 Pain in left ankle and joints of left foot: Secondary | ICD-10-CM

## 2020-08-28 DIAGNOSIS — M542 Cervicalgia: Secondary | ICD-10-CM

## 2020-08-28 DIAGNOSIS — R262 Difficulty in walking, not elsewhere classified: Secondary | ICD-10-CM

## 2020-08-28 DIAGNOSIS — R2681 Unsteadiness on feet: Secondary | ICD-10-CM

## 2020-08-28 DIAGNOSIS — R42 Dizziness and giddiness: Secondary | ICD-10-CM

## 2020-08-28 NOTE — Therapy (Signed)
[facility] [address] [place], [place], [address] Phone: [phone]   Fax:  [phone]  Physical Therapy Treatment  Patient Details  Name: Veronica Hunter MRN: [id number] Date of Birth: September 27, 1970 Referring Provider (PT): Anson Fret, MD   Encounter Date: 08/28/2020   PT End of Session - 08/28/20 1034     Visit Number 20    Number of Visits 21    Date for PT Re-Evaluation 08/28/20    Authorization Type UHC; VL: 30 (Ankle pain PT started as of 07/17/20)    PT Start Time 1025    PT Stop Time 1105    PT Time Calculation (min) 40 min    Activity Tolerance Patient tolerated treatment well    Behavior During Therapy [facility] for tasks assessed/performed             Past Medical History:  Diagnosis Date   H/O fracture 2020   left foot, right ankle    Past Surgical History:  Procedure Laterality Date   GALLBLADDER SURGERY  1996   right ankle fracture surgery  2020   2 plates/10 screws    right ankle tendon repair  2010   TONSILLECTOMY  1996   TOTAL VAGINAL HYSTERECTOMY  2009    There were no vitals filed for this visit.   Subjective Assessment - 08/28/20 1050     Subjective I had 2 sessiosn with my MD for my neck/shoulder stretching. First session felt good but I think it was litle more intense so I felt more stiff and painful.    Pertinent History h/o R foot/ankle fracture with surgery and tendon repair, getting ready to have plates/screw removed, GERD, panic/anxiety    Diagnostic tests ENT; attempted to have MRI yesterday but not able to complete    Patient Stated Goals Try to figure out what the root cause of the symptoms are and not make it worse    Currently in Pain? Yes    Pain Score 3     Pain Location Ankle    Pain Orientation Right    Pain Descriptors / Indicators Aching;Tender    Pain Onset More than a month ago                    Manual therapy: Grade I-IIrear foot inversion  and eversion tilts Grade I-II mid foot pronation  Grade I-II forefoot pronation Grade I-II subtalar AP mobilization Subtalar traction mobiliation with towel STM to anterior tibialis Scar tissue mobilization over anterior ankle Deep friction massage over distal anterior tibialis tendon  Manually resisted ankle DF, PF,iversion AROM: PF, DF, Inversion, and eversion: 20x Yellow band: ankle DF, PF, inversion, eversion: 20x                       PT Short Term Goals - 07/17/20 1210       PT SHORT TERM GOAL #1   Title Pt will tolerate full assessment of cervical ROM and vestibular assessment    Baseline not able to tolerate on first day    Time 4    Period Weeks    Status Achieved    Target Date 06/01/20      PT SHORT TERM GOAL #2   Title Pt will initiate HEP focusing on vestibular and neck exercises    Time 4    Period Weeks    Status Achieved    Target Date 06/01/20      PT SHORT  TERM GOAL #3   Title Pt will report 25% reduction in frequency of dizziness episodes on a daily basis    Time 4    Period Weeks    Status On-going    Target Date 06/01/20      PT SHORT TERM GOAL #4   Title Pt will dem 0 deg of ankle inversion to improve foot mechanics during gait    Baseline 07/17/20: lacks 3 deg to neutral for inversion    Time 2    Period Weeks    Status New    Target Date 07/31/20               PT Long Term Goals - 07/17/20 1211       PT LONG TERM GOAL #1   Title Pt will demonstrate independence with final HEP    Time 6    Period Weeks    Status New    Target Date 08/28/20      PT LONG TERM GOAL #2   Title Pt will increase DPS to >/= 59 and DFS to >/= 51    Baseline DPS: 52; DFS: 46.2    Time 6    Period Weeks    Status New    Target Date 08/28/20      PT LONG TERM GOAL #3   Title Pt will increase pain free cervical spine ROM by 10 degrees in all directions    Baseline TBD    Time 6    Period Weeks    Status New    Target Date  08/28/20      PT LONG TERM GOAL #4   Title Pt will report 75% reduction in symptoms on a daily basis    Time 6    Period Weeks    Status New    Target Date 08/28/20      PT LONG TERM GOAL #5   Title Pt will demo 15 deg of ankle DF to improve dorsiflexion during late stance of gait    Baseline 07/17/20: 5 deg    Time 6    Period Weeks    Status New    Target Date 08/28/20                   Plan - 08/28/20 1052     Clinical Impression Statement Pt's inversion ROM imporved compared to previous sessions.    Personal Factors and Comorbidities Comorbidity 1;Fitness;Past/Current Experience;Time since onset of injury/illness/exacerbation    Comorbidities h/o R foot/ankle fracture with surgery and tendon repair    Examination-Activity Limitations Locomotion Level;Sleep;Stand    Examination-Participation Restrictions Cleaning;Community Activity;Driving;Meal Prep;Occupation;Shop    Stability/Clinical Decision Making Evolving/Moderate complexity    Rehab Potential Good    PT Frequency 2x / week    PT Duration 4 weeks    PT Treatment/Interventions ADLs/Self Care Home Management;Aquatic Therapy;Canalith Repostioning;Cryotherapy;Electrical Stimulation;Moist Heat;Gait training;Stair training;Functional mobility training;Patient/family education;Therapeutic activities;Therapeutic exercise;Balance training;Neuromuscular re-education;Manual techniques;Passive range of motion;Dry needling;Vestibular;Taping;Joint Manipulations    PT Next Visit Plan work on ankle mobility    Consulted and Agree with Plan of Care Patient             Patient will benefit from skilled therapeutic intervention in order to improve the following deficits and impairments:  Decreased balance, Decreased range of motion, Difficulty walking, Dizziness, Pain, Decreased activity tolerance, Abnormal gait, Decreased endurance, Decreased mobility, Decreased scar mobility, Decreased skin integrity, Decreased strength,  Hypomobility, Increased fascial restricitons, Increased muscle spasms, Impaired flexibility, Impaired sensation  Visit Diagnosis: Unsteadiness on feet  Dizziness and giddiness  Cervicalgia  Difficulty in walking, not elsewhere classified  Pain in left ankle and joints of left foot     Problem List Patient Active Problem List   Diagnosis Date Noted   Dizziness 04/07/2020   Cervicalgia 04/07/2020    Ileana Ladd, PT 08/28/2020, 10:52 AM  [facility] [address] [place], [place], [address] Phone: [phone]   Fax:  [phone]  Name: Veronica Hunter MRN: [id number] Date of Birth: 1970/11/28

## 2020-08-30 ENCOUNTER — Ambulatory Visit: Payer: 59

## 2020-09-04 ENCOUNTER — Ambulatory Visit: Payer: 59

## 2020-09-04 ENCOUNTER — Other Ambulatory Visit: Payer: Self-pay

## 2020-09-04 DIAGNOSIS — R2681 Unsteadiness on feet: Secondary | ICD-10-CM | POA: Diagnosis not present

## 2020-09-04 DIAGNOSIS — M542 Cervicalgia: Secondary | ICD-10-CM

## 2020-09-04 DIAGNOSIS — M25572 Pain in left ankle and joints of left foot: Secondary | ICD-10-CM

## 2020-09-04 DIAGNOSIS — R42 Dizziness and giddiness: Secondary | ICD-10-CM

## 2020-09-04 DIAGNOSIS — R262 Difficulty in walking, not elsewhere classified: Secondary | ICD-10-CM

## 2020-09-04 NOTE — Therapy (Signed)
[facility] [address] [place], [place], [address] Phone: [phone]   Fax:  [phone]  Physical Therapy Treatment  Patient Details  Name: Veronica Hunter MRN: [id number] Date of Birth: 09/29/1970 Referring Provider (PT): Anson Fret, MD   Encounter Date: 09/04/2020   PT End of Session - 09/04/20 1022     Visit Number 21    Number of Visits 30    Date for PT Re-Evaluation 11/13/20    Authorization Type UHC; VL: 30 (Ankle pain PT started as of 07/17/20); 2nd recert from 09/04/20 to 11/13/20 (1x/week for 9 more sessions)    PT Start Time 1015    PT Stop Time 1100    PT Time Calculation (min) 45 min    Activity Tolerance Patient tolerated treatment well    Behavior During Therapy [facility] for tasks assessed/performed             Past Medical History:  Diagnosis Date   H/O fracture 2020   left foot, right ankle    Past Surgical History:  Procedure Laterality Date   GALLBLADDER SURGERY  1996   right ankle fracture surgery  2020   2 plates/10 screws    right ankle tendon repair  2010   TONSILLECTOMY  1996   TOTAL VAGINAL HYSTERECTOMY  2009    There were no vitals filed for this visit.   Subjective Assessment - 09/04/20 1023     Subjective I had few session with my MD for stretching but it seems like it is aggravating my dizziness more.    Pertinent History h/o R foot/ankle fracture with surgery and tendon repair, getting ready to have plates/screw removed, GERD, panic/anxiety    Diagnostic tests ENT; attempted to have MRI yesterday but not able to complete    Patient Stated Goals Try to figure out what the root cause of the symptoms are and not make it worse    Pain Onset More than a month ago                [facility] PT Assessment - 09/04/20 1026       AROM   Right Ankle Dorsiflexion 18    Right Ankle Plantar Flexion 30    Right Ankle Inversion 15    Right Ankle Eversion 30                  Soft tissue work to R ankle anterior tib, posterior tib, peroneals, anterior/lateral ligaments, medial ligaments Passive ROM into plantarflexion and inversion Demo pt on how to work on inversion Passive ROM at home Soft tissue work to L upper trap, scalenes, levator scapulae PROM of scapule in circles Cw, ccw, elevation/depression, protration and retraction                    PT Short Term Goals - 07/17/20 1210       PT SHORT TERM GOAL #1   Title Pt will tolerate full assessment of cervical ROM and vestibular assessment    Baseline not able to tolerate on first day    Time 4    Period Weeks    Status Achieved    Target Date 06/01/20      PT SHORT TERM GOAL #2   Title Pt will initiate HEP focusing on vestibular and neck exercises    Time 4    Period Weeks    Status Achieved    Target Date 06/01/20  PT SHORT TERM GOAL #3   Title Pt will report 25% reduction in frequency of dizziness episodes on a daily basis    Time 4    Period Weeks    Status On-going    Target Date 06/01/20      PT SHORT TERM GOAL #4   Title Pt will dem 0 deg of ankle inversion to improve foot mechanics during gait    Baseline 07/17/20: lacks 3 deg to neutral for inversion    Time 2    Period Weeks    Status New    Target Date 07/31/20               PT Long Term Goals - 09/04/20 1218       PT LONG TERM GOAL #1   Title Pt will demonstrate independence with final HEP    Time 6    Period Weeks    Status On-going      PT LONG TERM GOAL #2   Title Pt will increase DPS to >/= 59 and DFS to >/= 51    Baseline DPS: 52; DFS: 46.2    Time 6    Period Weeks    Status On-going      PT LONG TERM GOAL #3   Title Pt will increase pain free cervical spine ROM by 10 degrees in all directions    Baseline TBD    Time 6    Period Weeks    Status New      PT LONG TERM GOAL #4   Title Pt will report 75% reduction in symptoms on a daily basis    Baseline 50%    Time 6     Period Weeks    Status On-going      PT LONG TERM GOAL #5   Title Pt will demo 15 deg of ankle DF to improve dorsiflexion during late stance of gait    Baseline 07/17/20: 5 deg; 18 deg 09/04/20)    Time 6    Period Weeks    Status Achieved      Additional Long Term Goals   Additional Long Term Goals Yes      PT LONG TERM GOAL #6   Title Patient will demo at least 10 deg of R ankle inversion and 40 deg of ankle plantarflexion in R LE AROm to improve ankle mobility    Baseline AROM: Inversion: 0 deg; Plantarflexion: 35 deg (09/04/20)    Time 10    Period Weeks    Status Revised    Target Date 11/13/20      PT LONG TERM GOAL #7   Title Pt will report <2/10 pain in bil shoulders at worst to improve pain management    Baseline fluctuates 3-8/10 throughtout the day (09/04/20)    Time 10    Period Weeks    Status Revised    Target Date 11/13/20                   Plan - 09/04/20 1222     Clinical Impression Statement Patient has been seen for total of 21 sessions for dizziness, cervicalgia and R ankle pain. Patient has demonstrated increased R ankle ROM (AROM and PROM) compared to before. patient states that improvement in ankle pain has resulted in improvement in how her dizziness feels also. Patient continues to have flares up of neck/shoulder pain and ankle pain that leads to increased symptoms of dizziness per patient. Patient still has potential to  imrpove her ankle AROM and cervical AROM. Patient will continue to benefit froms skilled PT to improve neck pain, shoulder pain, ankle pain and self management of her symptoms.    Personal Factors and Comorbidities Comorbidity 1;Fitness;Past/Current Experience;Time since onset of injury/illness/exacerbation    Comorbidities h/o R foot/ankle fracture with surgery and tendon repair    Examination-Activity Limitations Locomotion Level;Sleep;Stand    Examination-Participation Restrictions Cleaning;Community Activity;Driving;Meal  Prep;Occupation;Shop    Stability/Clinical Decision Making Evolving/Moderate complexity    Rehab Potential Good    PT Frequency 1x / week    PT Duration Other (comment)   10 weeks   PT Treatment/Interventions ADLs/Self Care Home Management;Aquatic Therapy;Canalith Repostioning;Cryotherapy;Electrical Stimulation;Moist Heat;Gait training;Stair training;Functional mobility training;Patient/family education;Therapeutic activities;Therapeutic exercise;Balance training;Neuromuscular re-education;Manual techniques;Passive range of motion;Dry needling;Vestibular;Taping;Joint Manipulations    PT Next Visit Plan work on ankle mobility    Consulted and Agree with Plan of Care Patient             Patient will benefit from skilled therapeutic intervention in order to improve the following deficits and impairments:  Decreased balance, Decreased range of motion, Difficulty walking, Dizziness, Pain, Decreased activity tolerance, Abnormal gait, Decreased endurance, Decreased mobility, Decreased scar mobility, Decreased skin integrity, Decreased strength, Hypomobility, Increased fascial restricitons, Increased muscle spasms, Impaired flexibility, Impaired sensation  Visit Diagnosis: Unsteadiness on feet  Dizziness and giddiness  Cervicalgia  Difficulty in walking, not elsewhere classified  Pain in left ankle and joints of left foot     Problem List Patient Active Problem List   Diagnosis Date Noted   Dizziness 04/07/2020   Cervicalgia 04/07/2020    Ileana Ladd, PT 09/04/2020, 12:27 PM  [facility] [address] [place], [place], [address] Phone: [phone]   Fax:  [phone]  Name: Kymia Simi MRN: [id number] Date of Birth: [birth year]

## 2020-09-11 ENCOUNTER — Other Ambulatory Visit: Payer: Self-pay

## 2020-09-11 ENCOUNTER — Ambulatory Visit: Payer: 59 | Attending: Neurology

## 2020-09-11 DIAGNOSIS — M542 Cervicalgia: Secondary | ICD-10-CM | POA: Diagnosis present

## 2020-09-11 DIAGNOSIS — R42 Dizziness and giddiness: Secondary | ICD-10-CM

## 2020-09-11 DIAGNOSIS — R262 Difficulty in walking, not elsewhere classified: Secondary | ICD-10-CM | POA: Diagnosis present

## 2020-09-11 DIAGNOSIS — R2681 Unsteadiness on feet: Secondary | ICD-10-CM | POA: Insufficient documentation

## 2020-09-11 DIAGNOSIS — M25572 Pain in left ankle and joints of left foot: Secondary | ICD-10-CM

## 2020-09-11 NOTE — Therapy (Signed)
[facility] [address] [place], [place], [address] Phone: [phone]   Fax:  [phone]  Physical Therapy Treatment  Patient Details  Name: Veronica Hunter MRN: [id number] Date of Birth: [birth year] Referring Provider (PT): Anson Fret, MD   Encounter Date: 09/11/2020   PT End of Session - 09/11/20 1428     Visit Number 22    Number of Visits 30    Date for PT Re-Evaluation 11/13/20    Authorization Type UHC; VL: 30 (Ankle pain PT started as of 07/17/20); 2nd recert from 09/04/20 to 11/13/20 (1x/week for 9 more sessions)    PT Start Time 1015    PT Stop Time 1100    PT Time Calculation (min) 45 min    Activity Tolerance Patient tolerated treatment well    Behavior During Therapy [facility] for tasks assessed/performed             Past Medical History:  Diagnosis Date   H/O fracture 2020   left foot, right ankle    Past Surgical History:  Procedure Laterality Date   GALLBLADDER SURGERY  1996   right ankle fracture surgery  2020   2 plates/10 screws    right ankle tendon repair  2010   TONSILLECTOMY  1996   TOTAL VAGINAL HYSTERECTOMY  2009    There were no vitals filed for this visit.   Subjective Assessment - 09/11/20 1429     Subjective Since yesterday my shoulders feel like they have seized up. I fetl good for couple of days in My L shoulder but I think I slept wrong on my shoulders and woke up with stiffness. Ankle also feels very stiff.    Pertinent History h/o R foot/ankle fracture with surgery and tendon repair, getting ready to have plates/screw removed, GERD, panic/anxiety    Diagnostic tests ENT; attempted to have MRI yesterday but not able to complete    Patient Stated Goals Try to figure out what the root cause of the symptoms are and not make it worse    Pain Onset More than a month ago              Soft tissue work to anterior tibialis, peroneals, bil upper trap, infraspinatus  suprasapintus, deltoids Grade III PA mobilization to upper and mid thoracic spine and costovertebral joints Passive ankle plantar flexion, inversion and eversion Grade IV subtalar distraction mobilization Wall slides into flexion 10x R and L - vernally given as HEP for patient.                          PT Short Term Goals - 07/17/20 1210       PT SHORT TERM GOAL #1   Title Pt will tolerate full assessment of cervical ROM and vestibular assessment    Baseline not able to tolerate on first day    Time 4    Period Weeks    Status Achieved    Target Date 06/01/20      PT SHORT TERM GOAL #2   Title Pt will initiate HEP focusing on vestibular and neck exercises    Time 4    Period Weeks    Status Achieved    Target Date 06/01/20      PT SHORT TERM GOAL #3   Title Pt will report 25% reduction in frequency of dizziness episodes on a daily basis    Time 4    Period Weeks  Status On-going    Target Date 06/01/20      PT SHORT TERM GOAL #4   Title Pt will dem 0 deg of ankle inversion to improve foot mechanics during gait    Baseline 07/17/20: lacks 3 deg to neutral for inversion    Time 2    Period Weeks    Status New    Target Date 07/31/20               PT Long Term Goals - 09/04/20 1218       PT LONG TERM GOAL #1   Title Pt will demonstrate independence with final HEP    Time 6    Period Weeks    Status On-going      PT LONG TERM GOAL #2   Title Pt will increase DPS to >/= 59 and DFS to >/= 51    Baseline DPS: 52; DFS: 46.2    Time 6    Period Weeks    Status On-going      PT LONG TERM GOAL #3   Title Pt will increase pain free cervical spine ROM by 10 degrees in all directions    Baseline TBD    Time 6    Period Weeks    Status New      PT LONG TERM GOAL #4   Title Pt will report 75% reduction in symptoms on a daily basis    Baseline 50%    Time 6    Period Weeks    Status On-going      PT LONG TERM GOAL #5   Title Pt will  demo 15 deg of ankle DF to improve dorsiflexion during late stance of gait    Baseline 07/17/20: 5 deg; 18 deg 09/04/20)    Time 6    Period Weeks    Status Achieved      Additional Long Term Goals   Additional Long Term Goals Yes      PT LONG TERM GOAL #6   Title Patient will demo at least 10 deg of R ankle inversion and 40 deg of ankle plantarflexion in R LE AROm to improve ankle mobility    Baseline AROM: Inversion: 0 deg; Plantarflexion: 35 deg (09/04/20)    Time 10    Period Weeks    Status Revised    Target Date 11/13/20      PT LONG TERM GOAL #7   Title Pt will report <2/10 pain in bil shoulders at worst to improve pain management    Baseline fluctuates 3-8/10 throughtout the day (09/04/20)    Time 10    Period Weeks    Status Revised    Target Date 11/13/20                   Plan - 09/11/20 1430     Clinical Impression Statement Today's skilled session was focused on working on ankle mobility, soft tissue work on ankle and bil shoulders. Pt reported increased stiffness at end of the session in shoulers. pt was given new UE stretches to manage stiffness. Pt is reporting decreasing dizziness with improved pain management strategies gradually.    Personal Factors and Comorbidities Comorbidity 1;Fitness;Past/Current Experience;Time since onset of injury/illness/exacerbation    Comorbidities h/o R foot/ankle fracture with surgery and tendon repair    Examination-Activity Limitations Locomotion Level;Sleep;Stand    Examination-Participation Restrictions Cleaning;Community Activity;Driving;Meal Prep;Occupation;Shop    Stability/Clinical Decision Making Evolving/Moderate complexity    Rehab Potential Good  PT Frequency 1x / week    PT Duration Other (comment)   10 weeks   PT Treatment/Interventions ADLs/Self Care Home Management;Aquatic Therapy;Canalith Repostioning;Cryotherapy;Electrical Stimulation;Moist Heat;Gait training;Stair training;Functional mobility  training;Patient/family education;Therapeutic activities;Therapeutic exercise;Balance training;Neuromuscular re-education;Manual techniques;Passive range of motion;Dry needling;Vestibular;Taping;Joint Manipulations    PT Next Visit Plan work on ankle mobility    Consulted and Agree with Plan of Care Patient             Patient will benefit from skilled therapeutic intervention in order to improve the following deficits and impairments:  Decreased balance, Decreased range of motion, Difficulty walking, Dizziness, Pain, Decreased activity tolerance, Abnormal gait, Decreased endurance, Decreased mobility, Decreased scar mobility, Decreased skin integrity, Decreased strength, Hypomobility, Increased fascial restricitons, Increased muscle spasms, Impaired flexibility, Impaired sensation  Visit Diagnosis: Unsteadiness on feet  Dizziness and giddiness  Cervicalgia  Difficulty in walking, not elsewhere classified  Pain in left ankle and joints of left foot     Problem List Patient Active Problem List   Diagnosis Date Noted   Dizziness 04/07/2020   Cervicalgia 04/07/2020    Ileana Ladd, PT 09/11/2020, 2:32 PM  [facility] [address] [place], [place], [address] Phone: [phone]   Fax:  [phone]  Name: Veronica Hunter MRN: [id number] Date of Birth: 1970/05/28

## 2020-09-17 ENCOUNTER — Ambulatory Visit: Payer: 59

## 2020-09-18 ENCOUNTER — Other Ambulatory Visit: Payer: Self-pay

## 2020-09-18 ENCOUNTER — Ambulatory Visit: Payer: 59 | Admitting: Physical Therapy

## 2020-10-02 ENCOUNTER — Ambulatory Visit: Payer: 59

## 2020-10-02 ENCOUNTER — Other Ambulatory Visit: Payer: Self-pay

## 2020-10-02 DIAGNOSIS — R42 Dizziness and giddiness: Secondary | ICD-10-CM

## 2020-10-02 DIAGNOSIS — M542 Cervicalgia: Secondary | ICD-10-CM

## 2020-10-02 DIAGNOSIS — M25572 Pain in left ankle and joints of left foot: Secondary | ICD-10-CM

## 2020-10-02 DIAGNOSIS — R262 Difficulty in walking, not elsewhere classified: Secondary | ICD-10-CM

## 2020-10-02 DIAGNOSIS — R2681 Unsteadiness on feet: Secondary | ICD-10-CM | POA: Diagnosis not present

## 2020-10-02 NOTE — Therapy (Signed)
[facility] [address] [place], [place], [address] Phone: [phone]   Fax:  [phone]  Physical Therapy Treatment  Patient Details  Name: Veronica Hunter MRN: [id number] Date of Birth: 1970-08-28 Referring Provider (PT): Anson Fret, MD   Encounter Date: 10/02/2020   PT End of Session - 10/02/20 1024     Visit Number 23    Number of Visits 30    Date for PT Re-Evaluation 11/13/20    Authorization Type UHC; VL: 30 (Ankle pain PT started as of 07/17/20); 2nd recert from 09/04/20 to 11/13/20 (1x/week for 9 more sessions)    PT Start Time 0935    PT Stop Time 1020    PT Time Calculation (min) 45 min    Activity Tolerance Patient tolerated treatment well    Behavior During Therapy [facility] for tasks assessed/performed             Past Medical History:  Diagnosis Date   H/O fracture 2020   left foot, right ankle    Past Surgical History:  Procedure Laterality Date   GALLBLADDER SURGERY  1996   right ankle fracture surgery  2020   2 plates/10 screws    right ankle tendon repair  2010   TONSILLECTOMY  1996   TOTAL VAGINAL HYSTERECTOMY  2009    There were no vitals filed for this visit.   Subjective Assessment - 10/02/20 1025     Subjective Pt reports she had 10 good days and 2 bad days with her ankle over last 2 weeks. She was watching her son's puppy and did lot of stairs up and down throughout the day and to her surprise ankle felt good. shoulders feel very stiff today.    Pertinent History h/o R foot/ankle fracture with surgery and tendon repair, getting ready to have plates/screw removed, GERD, panic/anxiety    Diagnostic tests ENT; attempted to have MRI yesterday but not able to complete    Patient Stated Goals Try to figure out what the root cause of the symptoms are and not make it worse    Currently in Pain? Yes    Pain Score 8     Pain Location Shoulder    Pain Orientation Left;Right    Pain  Descriptors / Indicators Aching;Tightness    Pain Onset More than a month ago             Manual therapy: Soft tissue mobs to bil upper trap, levators scapulae, periscapular musculature, middle and posterior scalenes bil Grade II-III central and unilateral PA mobilization in thoracic spine  TherEx: Prone shoulder extensions: palms down: 10x Prone mid trap lifts: palms down: 10x                             PT Short Term Goals - 07/17/20 1210       PT SHORT TERM GOAL #1   Title Pt will tolerate full assessment of cervical ROM and vestibular assessment    Baseline not able to tolerate on first day    Time 4    Period Weeks    Status Achieved    Target Date 06/01/20      PT SHORT TERM GOAL #2   Title Pt will initiate HEP focusing on vestibular and neck exercises    Time 4    Period Weeks    Status Achieved    Target Date 06/01/20  PT SHORT TERM GOAL #3   Title Pt will report 25% reduction in frequency of dizziness episodes on a daily basis    Time 4    Period Weeks    Status On-going    Target Date 06/01/20      PT SHORT TERM GOAL #4   Title Pt will dem 0 deg of ankle inversion to improve foot mechanics during gait    Baseline 07/17/20: lacks 3 deg to neutral for inversion    Time 2    Period Weeks    Status New    Target Date 07/31/20               PT Long Term Goals - 09/04/20 1218       PT LONG TERM GOAL #1   Title Pt will demonstrate independence with final HEP    Time 6    Period Weeks    Status On-going      PT LONG TERM GOAL #2   Title Pt will increase DPS to >/= 59 and DFS to >/= 51    Baseline DPS: 52; DFS: 46.2    Time 6    Period Weeks    Status On-going      PT LONG TERM GOAL #3   Title Pt will increase pain free cervical spine ROM by 10 degrees in all directions    Baseline TBD    Time 6    Period Weeks    Status New      PT LONG TERM GOAL #4   Title Pt will report 75% reduction in symptoms on a  daily basis    Baseline 50%    Time 6    Period Weeks    Status On-going      PT LONG TERM GOAL #5   Title Pt will demo 15 deg of ankle DF to improve dorsiflexion during late stance of gait    Baseline 07/17/20: 5 deg; 18 deg 09/04/20)    Time 6    Period Weeks    Status Achieved      Additional Long Term Goals   Additional Long Term Goals Yes      PT LONG TERM GOAL #6   Title Patient will demo at least 10 deg of R ankle inversion and 40 deg of ankle plantarflexion in R LE AROm to improve ankle mobility    Baseline AROM: Inversion: 0 deg; Plantarflexion: 35 deg (09/04/20)    Time 10    Period Weeks    Status Revised    Target Date 11/13/20      PT LONG TERM GOAL #7   Title Pt will report <2/10 pain in bil shoulders at worst to improve pain management    Baseline fluctuates 3-8/10 throughtout the day (09/04/20)    Time 10    Period Weeks    Status Revised    Target Date 11/13/20                   Plan - 10/02/20 1026     Clinical Impression Statement Pt reported soreness in bil shoulders after manual therapy which pt was educated was expected. Pt has weakness in periscapular musculature as she had hard time performing 10 consecutive reps of mid trap lifts with palms down.    Personal Factors and Comorbidities Comorbidity 1;Fitness;Past/Current Experience;Time since onset of injury/illness/exacerbation    Comorbidities h/o R foot/ankle fracture with surgery and tendon repair    Examination-Activity Limitations Locomotion Level;Sleep;Stand  Examination-Participation Restrictions Cleaning;Community Activity;Driving;Meal Prep;Occupation;Shop    Stability/Clinical Decision Making Evolving/Moderate complexity    Rehab Potential Good    PT Frequency 1x / week    PT Duration Other (comment)   10 weeks   PT Treatment/Interventions ADLs/Self Care Home Management;Aquatic Therapy;Canalith Repostioning;Cryotherapy;Electrical Stimulation;Moist Heat;Gait training;Stair  training;Functional mobility training;Patient/family education;Therapeutic activities;Therapeutic exercise;Balance training;Neuromuscular re-education;Manual techniques;Passive range of motion;Dry needling;Vestibular;Taping;Joint Manipulations    PT Next Visit Plan work on ankle mobility    PT Home Exercise Plan Access Code [email or web address]    Consulted and Agree with Plan of Care Patient             Patient will benefit from skilled therapeutic intervention in order to improve the following deficits and impairments:  Decreased balance, Decreased range of motion, Difficulty walking, Dizziness, Pain, Decreased activity tolerance, Abnormal gait, Decreased endurance, Decreased mobility, Decreased scar mobility, Decreased skin integrity, Decreased strength, Hypomobility, Increased fascial restricitons, Increased muscle spasms, Impaired flexibility, Impaired sensation  Visit Diagnosis: Unsteadiness on feet  Dizziness and giddiness  Cervicalgia  Pain in left ankle and joints of left foot  Difficulty in walking, not elsewhere classified     Problem List Patient Active Problem List   Diagnosis Date Noted   Dizziness 04/07/2020   Cervicalgia 04/07/2020    Ileana Ladd, PT 10/02/2020, 10:28 AM  [facility] [address] [place], [place], [address] Phone: [phone]   Fax:  [phone]  Name: Veronica Hunter MRN: [id number] Date of Birth: 1971-02-22

## 2020-10-09 ENCOUNTER — Other Ambulatory Visit: Payer: Self-pay

## 2020-10-09 ENCOUNTER — Ambulatory Visit: Payer: 59 | Attending: Family Medicine

## 2020-10-09 DIAGNOSIS — R262 Difficulty in walking, not elsewhere classified: Secondary | ICD-10-CM | POA: Diagnosis present

## 2020-10-09 DIAGNOSIS — R42 Dizziness and giddiness: Secondary | ICD-10-CM

## 2020-10-09 DIAGNOSIS — M25572 Pain in left ankle and joints of left foot: Secondary | ICD-10-CM | POA: Insufficient documentation

## 2020-10-09 DIAGNOSIS — M542 Cervicalgia: Secondary | ICD-10-CM | POA: Diagnosis present

## 2020-10-09 DIAGNOSIS — R2681 Unsteadiness on feet: Secondary | ICD-10-CM

## 2020-10-09 NOTE — Therapy (Signed)
[facility] [address] [place], [place], [address] Phone: [phone]   Fax:  [phone]  Physical Therapy Treatment  Patient Details  Name: Archita Lomeli MRN: [id number] Date of Birth: [birth year] Referring Provider (PT): Anson Fret, MD   Encounter Date: 10/09/2020   PT End of Session - 10/09/20 1024     Visit Number 24    Number of Visits 30    Date for PT Re-Evaluation 11/13/20    Authorization Type UHC; VL: 30 (Ankle pain PT started as of 07/17/20); 2nd recert from 09/04/20 to 11/13/20 (1x/week for 9 more sessions)    PT Start Time 0930    PT Stop Time 1015    PT Time Calculation (min) 45 min    Activity Tolerance Patient tolerated treatment well    Behavior During Therapy [facility] for tasks assessed/performed             Past Medical History:  Diagnosis Date   H/O fracture 2020   left foot, right ankle    Past Surgical History:  Procedure Laterality Date   GALLBLADDER SURGERY  1996   right ankle fracture surgery  2020   2 plates/10 screws    right ankle tendon repair  2010   TONSILLECTOMY  1996   TOTAL VAGINAL HYSTERECTOMY  2009    There were no vitals filed for this visit.   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    Pertinent History h/o R foot/ankle fracture with surgery and tendon repair, getting ready to have plates/screw removed, GERD, panic/anxiety    Diagnostic tests ENT; attempted to have MRI yesterday but not able to complete    Patient Stated Goals Try to figure out what the root cause of the symptoms are and not make it worse    Pain Onset More than a month ago                      Manual therapy: Soft tissue mobs to bil upper trap, levators scapulae, periscapular musculature,  middle and posterior scalenes bil                       PT Short Term Goals - 07/17/20 1210       PT SHORT TERM GOAL #1   Title Pt will tolerate full assessment of cervical ROM and vestibular assessment    Baseline not able to tolerate on first day    Time 4    Period Weeks    Status Achieved    Target Date 06/01/20      PT SHORT TERM GOAL #2   Title Pt will initiate HEP focusing on vestibular and neck exercises    Time 4    Period Weeks    Status Achieved    Target Date 06/01/20      PT SHORT TERM GOAL #3   Title Pt will report 25% reduction in frequency of dizziness episodes on a daily basis    Time 4    Period Weeks    Status On-going    Target Date 06/01/20      PT SHORT TERM GOAL #4   Title Pt will dem 0 deg of ankle inversion to improve foot mechanics during  gait    Baseline 07/17/20: lacks 3 deg to neutral for inversion    Time 2    Period Weeks    Status New    Target Date 07/31/20               PT Long Term Goals - 09/04/20 1218       PT LONG TERM GOAL #1   Title Pt will demonstrate independence with final HEP    Time 6    Period Weeks    Status On-going      PT LONG TERM GOAL #2   Title Pt will increase DPS to >/= 59 and DFS to >/= 51    Baseline DPS: 52; DFS: 46.2    Time 6    Period Weeks    Status On-going      PT LONG TERM GOAL #3   Title Pt will increase pain free cervical spine ROM by 10 degrees in all directions    Baseline TBD    Time 6    Period Weeks    Status New      PT LONG TERM GOAL #4   Title Pt will report 75% reduction in symptoms on a daily basis    Baseline 50%    Time 6    Period Weeks    Status On-going      PT LONG TERM GOAL #5   Title Pt will demo 15 deg of ankle DF to improve dorsiflexion during late stance of gait    Baseline 07/17/20: 5 deg; 18 deg 09/04/20)    Time 6    Period Weeks    Status Achieved      Additional Long Term Goals   Additional Long Term Goals Yes      PT LONG TERM  GOAL #6   Title Patient will demo at least 10 deg of R ankle inversion and 40 deg of ankle plantarflexion in R LE AROm to improve ankle mobility    Baseline AROM: Inversion: 0 deg; Plantarflexion: 35 deg (09/04/20)    Time 10    Period Weeks    Status Revised    Target Date 11/13/20      PT LONG TERM GOAL #7   Title Pt will report <2/10 pain in bil shoulders at worst to improve pain management    Baseline fluctuates 3-8/10 throughtout the day (09/04/20)    Time 10    Period Weeks    Status Revised    Target Date 11/13/20                   Plan - 10/09/20 1034     Clinical Impression Statement Pt is reporting short term improvement in pain in neck and shoulder but recent TMJ flare up has caused limit in her progress. Pt was not complian with HEP due to increased in pain since last session.    Personal Factors and Comorbidities Comorbidity 1;Fitness;Past/Current Experience;Time since onset of injury/illness/exacerbation    Comorbidities h/o R foot/ankle fracture with surgery and tendon repair    Examination-Activity Limitations Locomotion Level;Sleep;Stand    Examination-Participation Restrictions Cleaning;Community Activity;Driving;Meal Prep;Occupation;Shop    Stability/Clinical Decision Making Evolving/Moderate complexity    Rehab Potential Good    PT Frequency 1x / week    PT Duration Other (comment)   10 weeks   PT Treatment/Interventions ADLs/Self Care Home Management;Aquatic Therapy;Canalith Repostioning;Cryotherapy;Electrical Stimulation;Moist Heat;Gait training;Stair training;Functional mobility training;Patient/family education;Therapeutic activities;Therapeutic exercise;Balance training;Neuromuscular re-education;Manual techniques;Passive range of motion;Dry needling;Vestibular;Taping;Joint Manipulations  PT Next Visit Plan work on ankle mobility    PT Home Exercise Plan Access Code [email or web address]    Consulted and Agree with Plan of Care Patient             Patient  will benefit from skilled therapeutic intervention in order to improve the following deficits and impairments:  Decreased balance, Decreased range of motion, Difficulty walking, Dizziness, Pain, Decreased activity tolerance, Abnormal gait, Decreased endurance, Decreased mobility, Decreased scar mobility, Decreased skin integrity, Decreased strength, Hypomobility, Increased fascial restricitons, Increased muscle spasms, Impaired flexibility, Impaired sensation  Visit Diagnosis: Unsteadiness on feet  Dizziness and giddiness  Cervicalgia  Pain in left ankle and joints of left foot  Difficulty in walking, not elsewhere classified     Problem List Patient Active Problem List   Diagnosis Date Noted   Dizziness 04/07/2020   Cervicalgia 04/07/2020    Ileana Ladd, PT 10/09/2020, 10:35 AM  [facility] [address] [place], [place], [address] Phone: [phone]   Fax:  [phone]  Name: Ariely Riddell MRN: [id number] Date of Birth: 1970/11/16

## 2020-10-16 ENCOUNTER — Other Ambulatory Visit: Payer: Self-pay

## 2020-10-16 ENCOUNTER — Ambulatory Visit: Payer: 59

## 2020-10-16 DIAGNOSIS — R42 Dizziness and giddiness: Secondary | ICD-10-CM

## 2020-10-16 DIAGNOSIS — R2681 Unsteadiness on feet: Secondary | ICD-10-CM | POA: Diagnosis not present

## 2020-10-16 DIAGNOSIS — R262 Difficulty in walking, not elsewhere classified: Secondary | ICD-10-CM

## 2020-10-16 DIAGNOSIS — M25572 Pain in left ankle and joints of left foot: Secondary | ICD-10-CM

## 2020-10-16 DIAGNOSIS — M542 Cervicalgia: Secondary | ICD-10-CM

## 2020-10-16 NOTE — Therapy (Signed)
[facility] [address] [place], [place], [address] Phone: [phone]   Fax:  [phone]  Physical Therapy Treatment  Patient Details  Name: Veronica Hunter MRN: [id number] Date of Birth: 12/04/1970 Referring Provider (PT): Anson Fret, MD   Encounter Date: 10/16/2020   PT End of Session - 10/16/20 1310     Visit Number 25    Number of Visits 30    Date for PT Re-Evaluation 11/13/20    Authorization Type UHC; VL: 30 (Ankle pain PT started as of 07/17/20); 2nd recert from 09/04/20 to 11/13/20 (1x/week for 9 more sessions)    PT Start Time 1015    PT Stop Time 1100    PT Time Calculation (min) 45 min    Activity Tolerance Patient tolerated treatment well    Behavior During Therapy [facility] for tasks assessed/performed             Past Medical History:  Diagnosis Date   H/O fracture 2020   left foot, right ankle    Past Surgical History:  Procedure Laterality Date   GALLBLADDER SURGERY  1996   right ankle fracture surgery  2020   2 plates/10 screws    right ankle tendon repair  2010   TONSILLECTOMY  1996   TOTAL VAGINAL HYSTERECTOMY  2009    There were no vitals filed for this visit.   Subjective Assessment - 10/16/20 1312     Subjective Pt reports she has good days and bad days. She felt good for 3 days afterwards. She hasbeen trying to do her exercises.    Pertinent History h/o R foot/ankle fracture with surgery and tendon repair, getting ready to have plates/screw removed, GERD, panic/anxiety    Diagnostic tests ENT; attempted to have MRI yesterday but not able to complete    Patient Stated Goals Try to figure out what the root cause of the symptoms are and not make it worse    Pain Score 6     Pain Location Neck    Pain Orientation Right;Left    Pain Descriptors / Indicators Aching;Tender    Pain Type Chronic pain    Pain Onset More than a month ago    Pain Frequency Constant                  Manual therapy: Soft tissue mobs to bil upper trap, levators scapulae, periscapular musculature, middle and posterior scalenes bil, bil clavicular portion of pecs, pec minor, anterior deltoids Gentle upper cervical traction Grade II-III cervical lateral glides and rotation mobilization throughtout cervical spine                            PT Short Term Goals - 07/17/20 1210       PT SHORT TERM GOAL #1   Title Pt will tolerate full assessment of cervical ROM and vestibular assessment    Baseline not able to tolerate on first day    Time 4    Period Weeks    Status Achieved    Target Date 06/01/20      PT SHORT TERM GOAL #2   Title Pt will initiate HEP focusing on vestibular and neck exercises    Time 4    Period Weeks    Status Achieved    Target Date 06/01/20      PT SHORT TERM GOAL #3   Title Pt will report 25% reduction in frequency of  dizziness episodes on a daily basis    Time 4    Period Weeks    Status On-going    Target Date 06/01/20      PT SHORT TERM GOAL #4   Title Pt will dem 0 deg of ankle inversion to improve foot mechanics during gait    Baseline 07/17/20: lacks 3 deg to neutral for inversion    Time 2    Period Weeks    Status New    Target Date 07/31/20               PT Long Term Goals - 09/04/20 1218       PT LONG TERM GOAL #1   Title Pt will demonstrate independence with final HEP    Time 6    Period Weeks    Status On-going      PT LONG TERM GOAL #2   Title Pt will increase DPS to >/= 59 and DFS to >/= 51    Baseline DPS: 52; DFS: 46.2    Time 6    Period Weeks    Status On-going      PT LONG TERM GOAL #3   Title Pt will increase pain free cervical spine ROM by 10 degrees in all directions    Baseline TBD    Time 6    Period Weeks    Status New      PT LONG TERM GOAL #4   Title Pt will report 75% reduction in symptoms on a daily basis    Baseline 50%    Time 6    Period Weeks    Status  On-going      PT LONG TERM GOAL #5   Title Pt will demo 15 deg of ankle DF to improve dorsiflexion during late stance of gait    Baseline 07/17/20: 5 deg; 18 deg 09/04/20)    Time 6    Period Weeks    Status Achieved      Additional Long Term Goals   Additional Long Term Goals Yes      PT LONG TERM GOAL #6   Title Patient will demo at least 10 deg of R ankle inversion and 40 deg of ankle plantarflexion in R LE AROm to improve ankle mobility    Baseline AROM: Inversion: 0 deg; Plantarflexion: 35 deg (09/04/20)    Time 10    Period Weeks    Status Revised    Target Date 11/13/20      PT LONG TERM GOAL #7   Title Pt will report <2/10 pain in bil shoulders at worst to improve pain management    Baseline fluctuates 3-8/10 throughtout the day (09/04/20)    Time 10    Period Weeks    Status Revised    Target Date 11/13/20                   Plan - 10/16/20 1312     Clinical Impression Statement Pt demonstrated hympomobility in upper cervical segements and lower cervical segments. Pt demo improved posture with soft tissue work to pectorals and anterior shoulders. Pt will continue to benefit from skilled PT for manual therapy to neck and shoulders to improve soft tissue and joint restrictions and improve pain.    Personal Factors and Comorbidities Comorbidity 1;Fitness;Past/Current Experience;Time since onset of injury/illness/exacerbation    Comorbidities h/o R foot/ankle fracture with surgery and tendon repair    Examination-Activity Limitations Locomotion Level;Sleep;Stand    Examination-Participation Restrictions  Cleaning;Community Activity;Driving;Meal Prep;Occupation;Shop    Stability/Clinical Decision Making Evolving/Moderate complexity    Rehab Potential Good    PT Frequency 1x / week    PT Duration Other (comment)   10 weeks   PT Treatment/Interventions ADLs/Self Care Home Management;Aquatic Therapy;Canalith Repostioning;Cryotherapy;Electrical Stimulation;Moist Heat;Gait  training;Stair training;Functional mobility training;Patient/family education;Therapeutic activities;Therapeutic exercise;Balance training;Neuromuscular re-education;Manual techniques;Passive range of motion;Dry needling;Vestibular;Taping;Joint Manipulations    PT Next Visit Plan work on ankle mobility    PT Home Exercise Plan Access Code [email or web address]    Consulted and Agree with Plan of Care Patient             Patient will benefit from skilled therapeutic intervention in order to improve the following deficits and impairments:  Decreased balance, Decreased range of motion, Difficulty walking, Dizziness, Pain, Decreased activity tolerance, Abnormal gait, Decreased endurance, Decreased mobility, Decreased scar mobility, Decreased skin integrity, Decreased strength, Hypomobility, Increased fascial restricitons, Increased muscle spasms, Impaired flexibility, Impaired sensation  Visit Diagnosis: Unsteadiness on feet  Dizziness and giddiness  Cervicalgia  Pain in left ankle and joints of left foot  Difficulty in walking, not elsewhere classified     Problem List Patient Active Problem List   Diagnosis Date Noted   Dizziness 04/07/2020   Cervicalgia 04/07/2020    Ileana Ladd, PT 10/16/2020, 1:14 PM  [facility] [address] [place], [place], [address] Phone: [phone]   Fax:  [phone]  Name: Veronica Hunter MRN: [id number] Date of Birth: Jan 21, 1971

## 2020-10-23 ENCOUNTER — Ambulatory Visit: Payer: 59

## 2020-10-30 ENCOUNTER — Other Ambulatory Visit: Payer: Self-pay

## 2020-10-30 ENCOUNTER — Ambulatory Visit: Payer: 59

## 2020-10-30 DIAGNOSIS — R262 Difficulty in walking, not elsewhere classified: Secondary | ICD-10-CM

## 2020-10-30 DIAGNOSIS — R2681 Unsteadiness on feet: Secondary | ICD-10-CM

## 2020-10-30 DIAGNOSIS — M542 Cervicalgia: Secondary | ICD-10-CM

## 2020-10-30 DIAGNOSIS — M25572 Pain in left ankle and joints of left foot: Secondary | ICD-10-CM

## 2020-10-30 DIAGNOSIS — R42 Dizziness and giddiness: Secondary | ICD-10-CM

## 2020-10-30 NOTE — Therapy (Signed)
[facility] [address] [place], [place], [address] Phone: [phone]   Fax:  [phone]  Physical Therapy Treatment  Patient Details  Name: Veronica Hunter MRN: [id number] Date of Birth: [birth year] Referring Provider (PT): Anson Fret, MD   Encounter Date: 10/30/2020   PT End of Session - 10/30/20 1042     Visit Number 26    Number of Visits 30    Date for PT Re-Evaluation 11/13/20    Authorization Type UHC; VL: 30 (Ankle pain PT started as of 07/17/20); 2nd recert from 09/04/20 to 11/13/20 (1x/week for 9 more sessions)    PT Start Time 0940    PT Stop Time 1020    PT Time Calculation (min) 40 min    Activity Tolerance Patient tolerated treatment well    Behavior During Therapy [facility] for tasks assessed/performed             Past Medical History:  Diagnosis Date   H/O fracture 2020   left foot, right ankle    Past Surgical History:  Procedure Laterality Date   GALLBLADDER SURGERY  1996   right ankle fracture surgery  2020   2 plates/10 screws    right ankle tendon repair  2010   TONSILLECTOMY  1996   TOTAL VAGINAL HYSTERECTOMY  2009    There were no vitals filed for this visit.   Subjective Assessment - 10/30/20 1043     Subjective I wore tennis shoes for one day last week and it made my ankle go back to square one. I had migraine for last 3 days. Today, she doesn't have headache but feels pain in back of neck.    Pertinent History h/o R foot/ankle fracture with surgery and tendon repair, getting ready to have plates/screw removed, GERD, panic/anxiety    Diagnostic tests ENT; attempted to have MRI yesterday but not able to complete    Patient Stated Goals Try to figure out what the root cause of the symptoms are and not make it worse    Currently in Pain? Yes    Pain Location Ankle    Pain Orientation Right    Pain Descriptors / Indicators Sharp    Pain Onset More than a month ago                  Grade I-II subtalar medial and lateral tilts, distractions Passive ankle dorsiflexion, plantarflexion, eversion and inversion AROM of ankle in all motions: 2 x 20 Suboccipital release Grade I-II PA mobilization of C1-2 Soft tissue mobilization to SCM on R, bil suboccipitalis, bil cervical paraspinalis Grade I-II lateral glide at R C2-triggered dizziness                         PT Short Term Goals - 07/17/20 1210       PT SHORT TERM GOAL #1   Title Pt will tolerate full assessment of cervical ROM and vestibular assessment    Baseline not able to tolerate on first day    Time 4    Period Weeks    Status Achieved    Target Date 06/01/20      PT SHORT TERM GOAL #2   Title Pt will initiate HEP focusing on vestibular and neck exercises    Time 4    Period Weeks    Status Achieved    Target Date 06/01/20      PT SHORT TERM GOAL #3  Title Pt will report 25% reduction in frequency of dizziness episodes on a daily basis    Time 4    Period Weeks    Status On-going    Target Date 06/01/20      PT SHORT TERM GOAL #4   Title Pt will dem 0 deg of ankle inversion to improve foot mechanics during gait    Baseline 07/17/20: lacks 3 deg to neutral for inversion    Time 2    Period Weeks    Status New    Target Date 07/31/20               PT Long Term Goals - 09/04/20 1218       PT LONG TERM GOAL #1   Title Pt will demonstrate independence with final HEP    Time 6    Period Weeks    Status On-going      PT LONG TERM GOAL #2   Title Pt will increase DPS to >/= 59 and DFS to >/= 51    Baseline DPS: 52; DFS: 46.2    Time 6    Period Weeks    Status On-going      PT LONG TERM GOAL #3   Title Pt will increase pain free cervical spine ROM by 10 degrees in all directions    Baseline TBD    Time 6    Period Weeks    Status New      PT LONG TERM GOAL #4   Title Pt will report 75% reduction in symptoms on a daily basis    Baseline 50%     Time 6    Period Weeks    Status On-going      PT LONG TERM GOAL #5   Title Pt will demo 15 deg of ankle DF to improve dorsiflexion during late stance of gait    Baseline 07/17/20: 5 deg; 18 deg 09/04/20)    Time 6    Period Weeks    Status Achieved      Additional Long Term Goals   Additional Long Term Goals Yes      PT LONG TERM GOAL #6   Title Patient will demo at least 10 deg of R ankle inversion and 40 deg of ankle plantarflexion in R LE AROm to improve ankle mobility    Baseline AROM: Inversion: 0 deg; Plantarflexion: 35 deg (09/04/20)    Time 10    Period Weeks    Status Revised    Target Date 11/13/20      PT LONG TERM GOAL #7   Title Pt will report <2/10 pain in bil shoulders at worst to improve pain management    Baseline fluctuates 3-8/10 throughtout the day (09/04/20)    Time 10    Period Weeks    Status Revised    Target Date 11/13/20                   Plan - 10/30/20 1207     Clinical Impression Statement Pt reported decreased pain in R ankle and neck at end of the session. Mobilization of C2 on R triggered dizziness to patient which went away after a few minutes. Patient continues to demonstrated hypersensitivity in tissues of neck and ankle and progress is slower then expected but gradual as she is reporting decreasing flare ups and better managemetn of pain.    Personal Factors and Comorbidities Comorbidity 1;Fitness;Past/Current Experience;Time since onset of injury/illness/exacerbation    Comorbidities  h/o R foot/ankle fracture with surgery and tendon repair    Examination-Activity Limitations Locomotion Level;Sleep;Stand    Examination-Participation Restrictions Cleaning;Community Activity;Driving;Meal Prep;Occupation;Shop    Stability/Clinical Decision Making Evolving/Moderate complexity    Rehab Potential Good    PT Frequency 1x / week    PT Duration Other (comment)   10 weeks   PT Treatment/Interventions ADLs/Self Care Home Management;Aquatic  Therapy;Canalith Repostioning;Cryotherapy;Electrical Stimulation;Moist Heat;Gait training;Stair training;Functional mobility training;Patient/family education;Therapeutic activities;Therapeutic exercise;Balance training;Neuromuscular re-education;Manual techniques;Passive range of motion;Dry needling;Vestibular;Taping;Joint Manipulations    PT Next Visit Plan work on ankle mobility    PT Home Exercise Plan Access Code [email or web address]    Consulted and Agree with Plan of Care Patient             Patient will benefit from skilled therapeutic intervention in order to improve the following deficits and impairments:  Decreased balance, Decreased range of motion, Difficulty walking, Dizziness, Pain, Decreased activity tolerance, Abnormal gait, Decreased endurance, Decreased mobility, Decreased scar mobility, Decreased skin integrity, Decreased strength, Hypomobility, Increased fascial restricitons, Increased muscle spasms, Impaired flexibility, Impaired sensation  Visit Diagnosis: Unsteadiness on feet  Dizziness and giddiness  Cervicalgia  Pain in left ankle and joints of left foot  Difficulty in walking, not elsewhere classified     Problem List Patient Active Problem List   Diagnosis Date Noted   Dizziness 04/07/2020   Cervicalgia 04/07/2020    Ileana Ladd, PT 10/30/2020, 12:10 PM  [facility] [address] [place], [place], [address] Phone: [phone]   Fax:  [phone]  Name: Hillary Struss MRN: [id number] Date of Birth: June 05, 1970

## 2020-11-06 ENCOUNTER — Ambulatory Visit: Payer: 59

## 2020-11-08 ENCOUNTER — Other Ambulatory Visit: Payer: Self-pay

## 2020-11-08 ENCOUNTER — Ambulatory Visit: Payer: 59 | Attending: Family Medicine

## 2020-11-08 DIAGNOSIS — M25572 Pain in left ankle and joints of left foot: Secondary | ICD-10-CM | POA: Diagnosis present

## 2020-11-08 DIAGNOSIS — R262 Difficulty in walking, not elsewhere classified: Secondary | ICD-10-CM | POA: Insufficient documentation

## 2020-11-08 DIAGNOSIS — R2681 Unsteadiness on feet: Secondary | ICD-10-CM | POA: Diagnosis present

## 2020-11-08 DIAGNOSIS — R42 Dizziness and giddiness: Secondary | ICD-10-CM | POA: Diagnosis present

## 2020-11-08 DIAGNOSIS — M542 Cervicalgia: Secondary | ICD-10-CM | POA: Diagnosis present

## 2020-11-08 NOTE — Therapy (Signed)
[facility] [address] [place], [place], [address] Phone: [phone]   Fax:  [phone]  Physical Therapy Treatment  Patient Details  Name: Veronica Hunter MRN: [id number] Date of Birth: 30-Nov-1970 Referring Provider (PT): Anson Fret, MD   Encounter Date: 11/08/2020   PT End of Session - 11/08/20 0941     Visit Number 27    Number of Visits 30    Date for PT Re-Evaluation 11/13/20    Authorization Type UHC; VL: 30 (Ankle pain PT started as of 07/17/20); 2nd recert from 09/04/20 to 11/13/20 (1x/week for 9 more sessions)    Authorization Time Period Recert next session    PT Start Time 0940    PT Stop Time 1020    PT Time Calculation (min) 40 min    Activity Tolerance Patient tolerated treatment well    Behavior During Therapy [facility] for tasks assessed/performed             Past Medical History:  Diagnosis Date   H/O fracture 2020   left foot, right ankle    Past Surgical History:  Procedure Laterality Date   GALLBLADDER SURGERY  1996   right ankle fracture surgery  2020   2 plates/10 screws    right ankle tendon repair  2010   TONSILLECTOMY  1996   TOTAL VAGINAL HYSTERECTOMY  2009    There were no vitals filed for this visit.   Subjective Assessment - 11/08/20 0941     Subjective Pt reports she has been having swelling on top of her R foot. She has been having few bad days due to ankle. Her ankle popped, and it was fine but then her ankle started hurting at night and it was very sharp and pain was going down to her toes. This happened last night. She is seeing Dr. Victorino Dike today for possible consult for surgery in 12/2020. Right now it feels okay but ankle just feels really sore.    Pertinent History h/o R foot/ankle fracture with surgery and tendon repair, getting ready to have plates/screw removed, GERD, panic/anxiety    Diagnostic tests ENT; attempted to have MRI yesterday but not able to complete     Patient Stated Goals Try to figure out what the root cause of the symptoms are and not make it worse    Currently in Pain? Yes    Pain Score 7     Pain Location Ankle    Pain Orientation Right    Pain Onset More than a month ago                  Towel desensitization over lateral and anterior ankle on R for 10' Grade I-II subtalar distractions and rear foot inversion mobilization Grade III cuboid mobilization PROM of ankle in all directions Sof tissue work over anterior talofibular ligament AROM of ankle into plantarflexion and dorsiflexion: 20x each with knee extended and knee flexed AROM of ankle into inversion and eversion with R leg in hooklying position Isometric ankle inversion: supine hooklying, pt pushes forefoot into playball bil: 10x 5" holds                         PT Short Term Goals - 07/17/20 1210       PT SHORT TERM GOAL #1   Title Pt will tolerate full assessment of cervical ROM and vestibular assessment    Baseline not able to tolerate on first day  Time 4    Period Weeks    Status Achieved    Target Date 06/01/20      PT SHORT TERM GOAL #2   Title Pt will initiate HEP focusing on vestibular and neck exercises    Time 4    Period Weeks    Status Achieved    Target Date 06/01/20      PT SHORT TERM GOAL #3   Title Pt will report 25% reduction in frequency of dizziness episodes on a daily basis    Time 4    Period Weeks    Status On-going    Target Date 06/01/20      PT SHORT TERM GOAL #4   Title Pt will dem 0 deg of ankle inversion to improve foot mechanics during gait    Baseline 07/17/20: lacks 3 deg to neutral for inversion    Time 2    Period Weeks    Status New    Target Date 07/31/20               PT Long Term Goals - 09/04/20 1218       PT LONG TERM GOAL #1   Title Pt will demonstrate independence with final HEP    Time 6    Period Weeks    Status On-going      PT LONG TERM GOAL #2   Title Pt will  increase DPS to >/= 59 and DFS to >/= 51    Baseline DPS: 52; DFS: 46.2    Time 6    Period Weeks    Status On-going      PT LONG TERM GOAL #3   Title Pt will increase pain free cervical spine ROM by 10 degrees in all directions    Baseline TBD    Time 6    Period Weeks    Status New      PT LONG TERM GOAL #4   Title Pt will report 75% reduction in symptoms on a daily basis    Baseline 50%    Time 6    Period Weeks    Status On-going      PT LONG TERM GOAL #5   Title Pt will demo 15 deg of ankle DF to improve dorsiflexion during late stance of gait    Baseline 07/17/20: 5 deg; 18 deg 09/04/20)    Time 6    Period Weeks    Status Achieved      Additional Long Term Goals   Additional Long Term Goals Yes      PT LONG TERM GOAL #6   Title Patient will demo at least 10 deg of R ankle inversion and 40 deg of ankle plantarflexion in R LE AROm to improve ankle mobility    Baseline AROM: Inversion: 0 deg; Plantarflexion: 35 deg (09/04/20)    Time 10    Period Weeks    Status Revised    Target Date 11/13/20      PT LONG TERM GOAL #7   Title Pt will report <2/10 pain in bil shoulders at worst to improve pain management    Baseline fluctuates 3-8/10 throughtout the day (09/04/20)    Time 10    Period Weeks    Status Revised    Target Date 11/13/20                   Plan - 11/08/20 1158     Clinical Impression Statement Today's session was focused on  neuro desensitization for soft tissues over R ankle to improve pain. With manual therapy, ankle plantarflexion ROM improved but inersion was still limited. Pt was very sensitive over anterior talofibular ligament during desensitization. Pt had quarter size pocket of swelling on lateral/distal ankle below lateral malleoli    Personal Factors and Comorbidities Comorbidity 1;Fitness;Past/Current Experience;Time since onset of injury/illness/exacerbation    Comorbidities h/o R foot/ankle fracture with surgery and tendon repair     Examination-Activity Limitations Locomotion Level;Sleep;Stand    Examination-Participation Restrictions Cleaning;Community Activity;Driving;Meal Prep;Occupation;Shop    Stability/Clinical Decision Making Evolving/Moderate complexity    Rehab Potential Good    PT Frequency 1x / week    PT Duration Other (comment)   10 weeks   PT Treatment/Interventions ADLs/Self Care Home Management;Aquatic Therapy;Canalith Repostioning;Cryotherapy;Electrical Stimulation;Moist Heat;Gait training;Stair training;Functional mobility training;Patient/family education;Therapeutic activities;Therapeutic exercise;Balance training;Neuromuscular re-education;Manual techniques;Passive range of motion;Dry needling;Vestibular;Taping;Joint Manipulations    PT Next Visit Plan Recert next session    PT Home Exercise Plan Access Code [email or web address]    Consulted and Agree with Plan of Care Patient             Patient will benefit from skilled therapeutic intervention in order to improve the following deficits and impairments:  Decreased balance, Decreased range of motion, Difficulty walking, Dizziness, Pain, Decreased activity tolerance, Abnormal gait, Decreased endurance, Decreased mobility, Decreased scar mobility, Decreased skin integrity, Decreased strength, Hypomobility, Increased fascial restricitons, Increased muscle spasms, Impaired flexibility, Impaired sensation  Visit Diagnosis: Unsteadiness on feet  Dizziness and giddiness  Cervicalgia  Pain in left ankle and joints of left foot  Difficulty in walking, not elsewhere classified     Problem List Patient Active Problem List   Diagnosis Date Noted   Dizziness 04/07/2020   Cervicalgia 04/07/2020    Ileana Ladd 11/08/2020, 12:02 PM  [place] [facility] [address] [place], [place], [address] Phone: [phone]   Fax:  [phone]  Name: Veronica Hunter MRN: [id number] Date of Birth:  [birth year]

## 2020-11-13 ENCOUNTER — Ambulatory Visit: Payer: 59

## 2020-11-20 ENCOUNTER — Ambulatory Visit: Payer: 59

## 2020-11-20 ENCOUNTER — Other Ambulatory Visit: Payer: Self-pay

## 2020-11-20 DIAGNOSIS — R2681 Unsteadiness on feet: Secondary | ICD-10-CM | POA: Diagnosis not present

## 2020-11-20 DIAGNOSIS — M542 Cervicalgia: Secondary | ICD-10-CM

## 2020-11-20 DIAGNOSIS — R262 Difficulty in walking, not elsewhere classified: Secondary | ICD-10-CM

## 2020-11-20 DIAGNOSIS — M25572 Pain in left ankle and joints of left foot: Secondary | ICD-10-CM

## 2020-11-20 DIAGNOSIS — R42 Dizziness and giddiness: Secondary | ICD-10-CM

## 2020-11-20 NOTE — Therapy (Signed)
[place] [address] [place], [place], [address] Phone: [phone]   Fax:  [phone]  Physical Therapy Recertification note  Patient Details  Name: Veronica Hunter MRN: [id number] Date of Birth: 1970/11/04 Referring Provider (PT): Melvenia Beam, MD   Encounter Date: 11/20/2020   PT End of Session - 11/20/20 1605     Visit Number 28    Number of Visits 30    Date for PT Re-Evaluation 12/24/20    Authorization Type UHC; VL: 30 (Ankle pain PT started as of 7/89/38); 2nd recert from 03/09/73 to 11/13/20 (1x/week for 9 more sessions)    Authorization Time Period Recert next session    PT Start Time 1015    PT Stop Time 1100    PT Time Calculation (min) 45 min    Activity Tolerance Patient tolerated treatment well    Behavior During Therapy [facility] for tasks assessed/performed             Past Medical History:  Diagnosis Date   H/O fracture 2020   left foot, right ankle    Past Surgical History:  Procedure Laterality Date   GALLBLADDER SURGERY  1996   right ankle fracture surgery  2020   2 plates/10 screws    right ankle tendon repair  2010   [place]  2009    There were no vitals filed for this visit.   Subjective Assessment - 11/20/20 1607     Subjective Pt reports I have been more active on my legs and I have had few flare up in my ankles. I also had neck pain and shoulder pain bout of dizziness. I have my surgery scheduled on 10.18.22 to remove the hardware.    Pertinent History h/o R foot/ankle fracture with surgery and tendon repair, getting ready to have plates/screw removed, GERD, panic/anxiety    Diagnostic tests ENT; attempted to have MRI yesterday but not able to complete    Patient Stated Goals Try to figure out what the root cause of the symptoms are and not make it worse    Currently in Pain? Yes    Pain Location Neck    Pain Orientation Right;Left     Pain Descriptors / Indicators Aching    Pain Onset More than a month ago                [facility] PT Assessment - 11/20/20 1049       AROM   Right Ankle Dorsiflexion 15    Right Ankle Plantar Flexion 35    Right Ankle Inversion 15    Right Ankle Eversion 30    Cervical Flexion 65    Cervical Extension 55    Cervical - Right Side Bend 40    Cervical - Left Side Bend 30    Cervical - Right Rotation 65    Cervical - Left Rotation 70                  Soft tissu emassage to suboccipital region bil Grade I-II lateral glides in upper and mid cervical spine Soft tissue mobilization to bil uper trap Passive anterior and middle scalene stretch bil Reassessment performed today                    PT Short Term Goals - 11/20/20 1608       PT SHORT TERM GOAL #1   Title Pt will tolerate full assessment  of cervical ROM and vestibular assessment    Baseline not able to tolerate on first day    Time 4    Period Weeks    Status Achieved    Target Date 06/01/20      PT SHORT TERM GOAL #2   Title Pt will initiate HEP focusing on vestibular and neck exercises    Time 4    Period Weeks    Status Achieved    Target Date 06/01/20      PT SHORT TERM GOAL #3   Title Pt will report 25% reduction in frequency of dizziness episodes on a daily basis    Time 4    Period Weeks    Status Achieved    Target Date 06/01/20      PT SHORT TERM GOAL #4   Title Pt will dem 0 deg of ankle inversion to improve foot mechanics during gait    Baseline 07/17/20: lacks 3 deg to neutral for inversion; 15 deg inversion 11/20/20    Time 2    Period Weeks    Status Achieved    Target Date 07/31/20               PT Long Term Goals - 11/20/20 1609       PT LONG TERM GOAL #1   Title Pt will demonstrate independence with final HEP    Time 6    Period Weeks    Status Achieved      PT LONG TERM GOAL #2   Title Pt will increase DPS to >/= 59 and DFS to >/= 51     Baseline DPS: 52; DFS: 46.2    Time 4    Period Weeks    Status On-going    Target Date 12/24/20      PT LONG TERM GOAL #3   Title Pt will increase pain free cervical spine ROM by 10 degrees in all directions    Baseline Goal met    Time 6    Period Weeks    Status Achieved      PT LONG TERM GOAL #4   Title Pt will report 75% reduction in symptoms on a daily basis    Baseline 50%    Time 6    Period Weeks    Status On-going      PT LONG TERM GOAL #5   Title Pt will demo 15 deg of ankle DF to improve dorsiflexion during late stance of gait    Baseline 07/17/20: 5 deg; 18 deg 09/04/20), 15 deg 11/20/20    Time 6    Period Weeks    Status Achieved      PT LONG TERM GOAL #6   Title Patient will demo at least 10 deg of R ankle inversion and 40 deg of ankle plantarflexion in R LE AROm to improve ankle mobility    Baseline AROM: Inversion: 0 deg; Plantarflexion: 35 deg (09/04/20), plantarflexion 35 deg, 15 deg ankle inversion    Time --    Period Weeks    Status On-going    Target Date 12/24/20      PT LONG TERM GOAL #7   Title Pt will report <2/10 pain in bil shoulders at worst to improve pain management    Baseline fluctuates 3-8/10 throughtout the day (09/04/20); pain fluctuating 3-8/10    Time --    Period Weeks    Status On-going    Target Date 12/24/20  Plan - 11/20/20 1612     Clinical Impression Statement Patient has been seen for total of 28 sessions for chronic neck pain, ankle pain and dizziness from 05/02/20 to 11/20/20. patient has demonstrated significant improvement in her cervical and ankle ROM and is able to functionally perform for activities at home despite significant reduction in her pain. Her progress is slower then expected but gradual. patient will benefit from 2 more sessions to continue to work on improving ankle ROM and finalize home exercise program for independent pain management.    Personal Factors and Comorbidities  Comorbidity 1;Fitness;Past/Current Experience;Time since onset of injury/illness/exacerbation    Comorbidities h/o R foot/ankle fracture with surgery and tendon repair    Examination-Activity Limitations Locomotion Level;Sleep;Stand    Examination-Participation Restrictions Cleaning;Community Activity;Driving;Meal Prep;Occupation;Shop    Stability/Clinical Decision Making Evolving/Moderate complexity    Rehab Potential Good    PT Frequency Other (comment)   2 visits   PT Duration Other (comment)    12/24/20   PT Treatment/Interventions ADLs/Self Care Home Management;Aquatic Therapy;Canalith Repostioning;Cryotherapy;Electrical Stimulation;Moist Heat;Gait training;Stair training;Functional mobility training;Patient/family education;Therapeutic activities;Therapeutic exercise;Balance training;Neuromuscular re-education;Manual techniques;Passive range of motion;Dry needling;Vestibular;Taping;Joint Manipulations    PT Next Visit Plan Fainlize HEP    PT Home Exercise Plan Access Code [email or web address]    Consulted and Agree with Plan of Care Patient             Patient will benefit from skilled therapeutic intervention in order to improve the following deficits and impairments:  Decreased balance, Decreased range of motion, Difficulty walking, Dizziness, Pain, Decreased activity tolerance, Abnormal gait, Decreased endurance, Decreased mobility, Decreased scar mobility, Decreased skin integrity, Decreased strength, Hypomobility, Increased fascial restricitons, Increased muscle spasms, Impaired flexibility, Impaired sensation  Visit Diagnosis: Unsteadiness on feet  Dizziness and giddiness  Cervicalgia  Pain in left ankle and joints of left foot  Difficulty in walking, not elsewhere classified     Problem List Patient Active Problem List   Diagnosis Date Noted   Dizziness 04/07/2020   Cervicalgia 04/07/2020    Kerrie Pleasure, PT 11/20/2020, 4:19 PM  [place] [address] [place], [place], [address] Phone: [phone]   Fax:  [phone]  Name: Ayelet Gruenewald MRN: [id number] Date of Birth: May 26, 1970

## 2020-11-27 ENCOUNTER — Ambulatory Visit: Payer: 59

## 2020-12-18 ENCOUNTER — Other Ambulatory Visit: Payer: Self-pay

## 2020-12-18 ENCOUNTER — Ambulatory Visit: Payer: 59 | Attending: Family Medicine

## 2020-12-18 DIAGNOSIS — M542 Cervicalgia: Secondary | ICD-10-CM | POA: Diagnosis present

## 2020-12-18 DIAGNOSIS — R42 Dizziness and giddiness: Secondary | ICD-10-CM

## 2020-12-18 DIAGNOSIS — M25572 Pain in left ankle and joints of left foot: Secondary | ICD-10-CM | POA: Diagnosis present

## 2020-12-18 DIAGNOSIS — R262 Difficulty in walking, not elsewhere classified: Secondary | ICD-10-CM | POA: Diagnosis present

## 2020-12-18 DIAGNOSIS — R2681 Unsteadiness on feet: Secondary | ICD-10-CM | POA: Diagnosis not present

## 2020-12-18 NOTE — Therapy (Signed)
[place] [address] [place], [place], [address] Phone: [phone]   Fax:  [phone]  Physical Therapy Treatment  Patient Details  Name: Veronica Hunter MRN: [id number] Date of Birth: Jul 21, 1970 Referring Provider (PT): Melvenia Beam, MD   Encounter Date: 12/18/2020   PT End of Session - 12/18/20 1047     Visit Number 29    Number of Visits 30    Date for PT Re-Evaluation 12/24/20    Authorization Type UHC; VL: 30 (Ankle pain PT started as of 5/97/41); 2nd recert from 6/38/45 to 11/13/20 (1x/week for 9 more sessions)    Authorization Time Period Recert next session    Activity Tolerance Patient tolerated treatment well    Behavior During Therapy [facility] for tasks assessed/performed             Past Medical History:  Diagnosis Date   H/O fracture 2020   left foot, right ankle    Past Surgical History:  Procedure Laterality Date   GALLBLADDER SURGERY  1996   right ankle fracture surgery  2020   2 plates/10 screws    right ankle tendon repair  2010   [address]  2009    There were no vitals filed for this visit.   Subjective Assessment - 12/18/20 0941     Subjective I have had extreme flare ups and also days when it felt really good Falre ups lasted anywhere from few hours to coupld of days.    Pertinent History h/o R foot/ankle fracture with surgery and tendon repair, getting ready to have plates/screw removed, GERD, panic/anxiety    Diagnostic tests ENT; attempted to have MRI yesterday but not able to complete    Patient Stated Goals Try to figure out what the root cause of the symptoms are and not make it worse    Pain Onset More than a month ago                [facility] PT Assessment - 12/18/20 0955       AROM   Right Ankle Dorsiflexion 15    Right Ankle Plantar Flexion 40    Right Ankle Inversion 4    Right Ankle Eversion 25    Cervical Flexion 70     Cervical Extension 55    Cervical - Right Side Bend 40    Cervical - Left Side Bend 40    Cervical - Right Rotation 73    Cervical - Left Rotation 71                  Manual therapy STM to bil upper trap, levator scapule Grade III-IV unilateral PA mobilization with functional cervical flexion, rotation mobilization STM to R anterior tib muscle and tendons, grade IV subtalar medial and lateral tilt mobilizations  Reviewed managing pain by modifiying activities, wearing proper showewear, complince with HEP (pt reports she is compliant with HEP)                    PT Short Term Goals - 12/18/20 1049       PT SHORT TERM GOAL #1   Title Pt will tolerate full assessment of cervical ROM and vestibular assessment    Baseline not able to tolerate on first day    Time 4    Period Weeks    Status Achieved    Target Date 06/01/20      PT SHORT TERM  GOAL #2   Title Pt will initiate HEP focusing on vestibular and neck exercises    Time 4    Period Weeks    Status Achieved    Target Date 06/01/20      PT SHORT TERM GOAL #3   Title Pt will report 25% reduction in frequency of dizziness episodes on a daily basis    Time 4    Period Weeks    Status Achieved    Target Date 06/01/20      PT SHORT TERM GOAL #4   Title Pt will dem 0 deg of ankle inversion to improve foot mechanics during gait    Baseline 07/17/20: lacks 3 deg to neutral for inversion; 15 deg inversion 11/20/20    Time 2    Period Weeks    Status Achieved    Target Date 07/31/20               PT Long Term Goals - 12/18/20 1050       PT LONG TERM GOAL #1   Title Pt will demonstrate independence with final HEP    Time 6    Period Weeks    Status Achieved      PT LONG TERM GOAL #2   Title Pt will increase DPS to >/= 59 and DFS to >/= 51    Baseline DPS: 52; DFS: 46.2    Time 4    Period Weeks    Status Not Met      PT LONG TERM GOAL #3   Title Pt will increase pain free  cervical spine ROM by 10 degrees in all directions    Baseline Goal met    Time 6    Period Weeks    Status Achieved      PT LONG TERM GOAL #4   Title Pt will report 75% reduction in symptoms on a daily basis    Baseline >50% (12/18/20)    Time 6    Period Weeks    Status Achieved      PT LONG TERM GOAL #5   Title Pt will demo 15 deg of ankle DF to improve dorsiflexion during late stance of gait    Baseline 07/17/20: 5 deg; 18 deg 09/04/20), 15 deg 11/20/20; 15 deg 12/18/20    Time 6    Period Weeks    Status Achieved      PT LONG TERM GOAL #6   Title Patient will demo at least 10 deg of R ankle inversion and 40 deg of ankle plantarflexion in R LE AROm to improve ankle mobility    Baseline AROM: Inversion: 0 deg; Plantarflexion: 35 deg (09/04/20), plantarflexion 35 deg, 15 deg ankle inversion; 5 deg of ankle ivnersion, 40 deg of PF    Period Weeks    Status Achieved      PT LONG TERM GOAL #7   Title Pt will report <2/10 pain in bil shoulders at worst to improve pain management    Baseline fluctuates 3-8/10 throughtout the day (09/04/20); pain fluctuating 3-8/10    Period Weeks    Status Not Met                   Plan - 12/18/20 1051     Clinical Impression Statement Pt demonstrated improved ROM in her cervical spine compared to last reassessment about a month ago.    Personal Factors and Comorbidities Comorbidity 1;Fitness;Past/Current Experience;Time since onset of injury/illness/exacerbation    Comorbidities h/o R  foot/ankle fracture with surgery and tendon repair    Examination-Activity Limitations Locomotion Level;Sleep;Stand    Examination-Participation Restrictions Cleaning;Community Activity;Driving;Meal Prep;Occupation;Shop    Stability/Clinical Decision Making Evolving/Moderate complexity    Rehab Potential Good    PT Frequency Other (comment)   2 visits   PT Duration Other (comment)   12/24/20   PT Treatment/Interventions ADLs/Self Care Home  Management;Aquatic Therapy;Canalith Repostioning;Cryotherapy;Electrical Stimulation;Moist Heat;Gait training;Stair training;Functional mobility training;Patient/family education;Therapeutic activities;Therapeutic exercise;Balance training;Neuromuscular re-education;Manual techniques;Passive range of motion;Dry needling;Vestibular;Taping;Joint Manipulations    PT Next Visit Plan Fainlize HEP    PT Home Exercise Plan Access Code [email or web address]    Consulted and Agree with Plan of Care Patient             Patient will benefit from skilled therapeutic intervention in order to improve the following deficits and impairments:  Decreased balance, Decreased range of motion, Difficulty walking, Dizziness, Pain, Decreased activity tolerance, Abnormal gait, Decreased endurance, Decreased mobility, Decreased scar mobility, Decreased skin integrity, Decreased strength, Hypomobility, Increased fascial restricitons, Increased muscle spasms, Impaired flexibility, Impaired sensation  Visit Diagnosis: Unsteadiness on feet  Dizziness and giddiness  Cervicalgia  Pain in left ankle and joints of left foot  Difficulty in walking, not elsewhere classified     Problem List Patient Active Problem List   Diagnosis Date Noted   Dizziness 04/07/2020   Cervicalgia 04/07/2020    Kerrie Pleasure, PT 12/18/2020, 10:52 AM  [place] [address] [place], [place], [address] Phone: [phone]   Fax:  [phone]  Name: Chanel Mcadams MRN: [id number] Date of Birth: 19-Jan-1971

## 2020-12-20 ENCOUNTER — Ambulatory Visit: Payer: 59

## 2021-01-15 ENCOUNTER — Other Ambulatory Visit: Payer: Self-pay

## 2021-01-15 ENCOUNTER — Ambulatory Visit: Payer: 59

## 2021-01-15 ENCOUNTER — Ambulatory Visit: Payer: 59 | Attending: Family Medicine

## 2021-01-15 DIAGNOSIS — M25571 Pain in right ankle and joints of right foot: Secondary | ICD-10-CM

## 2021-01-15 DIAGNOSIS — Z9889 Other specified postprocedural states: Secondary | ICD-10-CM

## 2021-01-15 DIAGNOSIS — R2681 Unsteadiness on feet: Secondary | ICD-10-CM | POA: Diagnosis present

## 2021-01-15 DIAGNOSIS — R262 Difficulty in walking, not elsewhere classified: Secondary | ICD-10-CM

## 2021-01-15 NOTE — Therapy (Signed)
OUTPATIENT PHYSICAL THERAPY LOWER EXTREMITY EVALUATION   Patient Name: Veronica Hunter MRN: [id number] DOB:1970-06-10, 50 y.o., female Today's Date: 01/15/2021   PT End of Session - 01/15/21 1202     Visit Number 1    Number of Visits 17    Date for PT Re-Evaluation 03/12/21    Authorization Type UHC    PT Start Time 0845    PT Stop Time 0930    PT Time Calculation (min) 45 min    Activity Tolerance Patient tolerated treatment well    Behavior During Therapy [facility] for tasks assessed/performed             Past Medical History:  Diagnosis Date   H/O fracture 2020   left foot, right ankle   Past Surgical History:  Procedure Laterality Date   GALLBLADDER SURGERY  1996   right ankle fracture surgery  2020   2 plates/10 screws    right ankle tendon repair  2010   TONSILLECTOMY  1996   TOTAL VAGINAL HYSTERECTOMY  2009   Patient Active Problem List   Diagnosis Date Noted   Dizziness 04/07/2020   Cervicalgia 04/07/2020    PCP: Mattie Marlin, DO  REFERRING PROVIDER: Jacinta Shoe, PA-C  REFERRING DIAG: [phone] (ICD-10-CM) - Encounter for other specified surgical aftercare  THERAPY DIAG:  Unsteadiness on feet  Difficulty in walking, not elsewhere classified  History of ankle surgery  Pain in right ankle and joints of right foot  ONSET DATE: 12/15/20  SUBJECTIVE:   SUBJECTIVE STATEMENT: Pt had surgery to remove hardware on 12/15/20 and some clean up in R ankle. Initial surgery was 01/2019 from a trimalleolar fracture. Currently she is 4 weeks out and stopped wearing boot 01/12/21 and into regular shoes. Per patient she doesn't have any precautions.  PERTINENT HISTORY: Hx of R ankle fracture  PAIN:  Are you having pain? Yes VAS scale: 8/10 Pain location: R ankle Pain orientation: Right  PAIN TYPE: aching and sharp Pain description: intermittent, sharp, and stabbing  Aggravating factors: pivotting, sharp turns Relieving factors: rest  PRECAUTIONS:  None  WEIGHT BEARING RESTRICTIONS No  FALLS:  Has patient fallen in last 6 months? No, Number of falls: 0  LIVING ENVIRONMENT: Lives with: lives with their spouse Lives in: House/apartment Stairs: Yes; External: 1 steps; Rail on none going up Has following equipment at home: Single point cane  OCCUPATION: Not working  PLOF: Independent  PATIENT GOALS Improve walking, be pain free   OBJECTIVE:   COGNITION:  Overall cognitive status: Within functional limits for tasks assessed      LE AROM/PROM:  A/PROM Right 01/15/2021 Left 01/15/2021  Ankle dorsiflexion 0 15  Ankle plantarflexion 25 55  Ankle inversion 2 45  Ankle eversion 6 30   (Blank rows = not tested)  LE MMT:  MMT Right 01/15/2021 Left 01/15/2021  Ankle dorsiflexion 5 5  Ankle plantarflexion 2+ 5  Ankle inversion 1+ 5  Ankle eversion 1+ 55   (Blank rows = not tested)   JOINT MOBILITY ASSESSMENT:  Decreased subtalar, mid foot and forefoot mobility grossly in R ankle/foot Functional testing: - 10 meter walk test: 0.39 m/s with st. cane - 5x sit to stand: 15.35 sec without HHA - Modified CTSIB: 120/120 seconds - SLS: R LE: 0 sec; L LE: 11 sec  GAIT: Distance walked: 115' Assistive device utilized: Single point cane Level of assistance: Modified independence Comments: decreased heel to toe contact, decreased WB through R LE, decreased push off on  R, wide BOS, decreased step length, decreased cadence.    TODAY'S TREATMENT: TherEx: - seated figure 4: ankle inversion: AA 20x - Seated figure 4: ankle eversion: AA 20x - Seated ankle DF stretch: 3 x 20"   PATIENT EDUCATION:  Education details: Educated on pain management with ice and exercises Person educated: Patient Education method: Explanation Education comprehension: verbalized understanding   HOME EXERCISE PROGRAM: Access Code [email or web address]  ASSESSMENT:  CLINICAL IMPRESSION: Patient is a 50 y.o. female who was seen today for physical  therapy evaluation and treatment for R ankle pain, gait and mobility disorder after s/p R ankle surgery to remove old hardware from previous trimalleolar ORIF. Objective impairments include Abnormal gait, decreased activity tolerance, decreased balance, decreased endurance, decreased mobility, difficulty walking, decreased ROM, decreased strength, dizziness, postural dysfunction, and pain. These impairments are limiting patient from cleaning, community activity, driving, meal prep, laundry, yard work, and yard work. Personal factors including Time since onset of injury/illness/exacerbation are also affecting patient's functional outcome. Patient will benefit from skilled PT to address above impairments and improve overall function.  REHAB POTENTIAL: Good  CLINICAL DECISION MAKING: Stable/uncomplicated  EVALUATION COMPLEXITY: Low   GOALS: Goals reviewed with patient? Yes  SHORT TERM GOALS:  STG Name Target Date Goal status  1 Pt will be able to resume driving with <8/58 pain in R ankle Baseline: has not resumed driving (eval) 85/0/2774 INITIAL  2 Pt will demo 5 deg of AROM of R ankle dorsiflexion to improve gait Baseline: 0 deg (eval) 02/12/2021 INITIAL  3 Pt will demo 10 deg of ankle inversion in R ankle to improve ability to pivot on R LE Baseline:2 deg (eval) 02/12/2021 INITIAL  4 Pt will be able to perform 5x sit to stand <12 sec without HHA to improve functional strength of LE Baseline: 15 sec (eval) 02/12/2021 INITIAL   LONG TERM GOALS:   LTG Name Target Date Goal status  1 Pt will demo gait speed without AD that is >1 m/s to improve community ambulation Baseline:0.77m/s with st. Cane (Eval) 03/12/2021 INITIAL  2 Pt will be able to perform 10 unilateral heel raises to improve strength of plantarflexors in R LE Baseline: pt unable to perform standing unilateral heel raise (eva) 03/12/2021 INITIAL  3 Pt will be able to ambulate 400 feet on grass without AD and without pain in ankle to  improve community access Baseline: not attempted (eval) 03/12/2021 INITIAL   PLAN: PT FREQUENCY: 2x/week  PT DURATION: 8 weeks  PLANNED INTERVENTIONS: Therapeutic exercises, Therapeutic activity, Neuro Muscular re-education, Balance training, Gait training, Patient/Family education, Joint mobilization, Stair training, Dry Needling, Cryotherapy, Moist heat, scar mobilization, Taping, and Manual therapy  PLAN FOR NEXT SESSION: Bil heel raises, manual mobilization to R ankle, review HEP   Ileana Ladd, PT 01/15/2021, 12:03 PM

## 2021-01-17 ENCOUNTER — Ambulatory Visit: Payer: 59

## 2021-01-23 ENCOUNTER — Ambulatory Visit: Payer: 59

## 2021-02-05 ENCOUNTER — Ambulatory Visit: Payer: 59

## 2021-02-07 ENCOUNTER — Ambulatory Visit: Payer: 59

## 2021-02-12 ENCOUNTER — Ambulatory Visit (INDEPENDENT_AMBULATORY_CARE_PROVIDER_SITE_OTHER): Payer: 59 | Admitting: Neurology

## 2021-02-12 ENCOUNTER — Encounter: Payer: Self-pay | Admitting: Neurology

## 2021-02-12 ENCOUNTER — Telehealth: Payer: Self-pay

## 2021-02-12 ENCOUNTER — Ambulatory Visit: Payer: 59

## 2021-02-12 ENCOUNTER — Telehealth: Payer: Self-pay | Admitting: Neurology

## 2021-02-12 VITALS — BP 145/79 | HR 88 | Ht 67.0 in | Wt 242.8 lb

## 2021-02-12 DIAGNOSIS — G8929 Other chronic pain: Secondary | ICD-10-CM | POA: Diagnosis not present

## 2021-02-12 DIAGNOSIS — M7918 Myalgia, other site: Secondary | ICD-10-CM | POA: Insufficient documentation

## 2021-02-12 DIAGNOSIS — R42 Dizziness and giddiness: Secondary | ICD-10-CM

## 2021-02-12 DIAGNOSIS — M542 Cervicalgia: Secondary | ICD-10-CM | POA: Diagnosis not present

## 2021-02-12 NOTE — Patient Instructions (Signed)
Follow up as needed

## 2021-02-12 NOTE — Telephone Encounter (Signed)
Pt called, would like for Dr. Lucia Gaskins to ahead and send referral for PT so can get on the book for January. Would like a call from the nurse.

## 2021-02-12 NOTE — Telephone Encounter (Signed)
Returned patient's phone. Insurance is not going to approve any more visits for this year. She has reached out to Dr. Lucia Gaskins for neck pain referral and will be placed in system in future. She wants to schedule PT visits starting January. Pt was advised that as soon as referral is placed in the system, our office will be calling her to schedule her appts for January.

## 2021-02-12 NOTE — Telephone Encounter (Signed)
Spoke with pt. She just saw Theone Murdoch PT and he told her we can go ahead and order PT and he will place her on his schedule at the beginning of January. They know when to file with insurance as the reason why she had requested to wait was due to insurance.
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
## 2021-02-14 ENCOUNTER — Ambulatory Visit: Payer: 59

## 2021-02-16 NOTE — Addendum Note (Signed)
Addended by: Naomie Dean B on: 02/16/2021 09:57 PM   Modules accepted: Orders

## 2021-02-19 ENCOUNTER — Ambulatory Visit: Payer: 59

## 2021-02-21 ENCOUNTER — Ambulatory Visit: Payer: 59

## 2021-02-26 ENCOUNTER — Ambulatory Visit: Payer: 59

## 2021-02-28 ENCOUNTER — Ambulatory Visit: Payer: 59

## 2021-03-12 ENCOUNTER — Other Ambulatory Visit: Payer: Self-pay

## 2021-03-12 ENCOUNTER — Ambulatory Visit: Payer: 59 | Attending: Family Medicine

## 2021-03-12 DIAGNOSIS — M25571 Pain in right ankle and joints of right foot: Secondary | ICD-10-CM | POA: Insufficient documentation

## 2021-03-12 DIAGNOSIS — M7918 Myalgia, other site: Secondary | ICD-10-CM | POA: Insufficient documentation

## 2021-03-12 DIAGNOSIS — G8929 Other chronic pain: Secondary | ICD-10-CM | POA: Insufficient documentation

## 2021-03-12 DIAGNOSIS — M25572 Pain in left ankle and joints of left foot: Secondary | ICD-10-CM | POA: Insufficient documentation

## 2021-03-12 DIAGNOSIS — R42 Dizziness and giddiness: Secondary | ICD-10-CM | POA: Diagnosis not present

## 2021-03-12 DIAGNOSIS — R2681 Unsteadiness on feet: Secondary | ICD-10-CM | POA: Diagnosis present

## 2021-03-12 DIAGNOSIS — M542 Cervicalgia: Secondary | ICD-10-CM | POA: Diagnosis not present

## 2021-03-12 DIAGNOSIS — R262 Difficulty in walking, not elsewhere classified: Secondary | ICD-10-CM | POA: Diagnosis present

## 2021-03-12 DIAGNOSIS — Z9889 Other specified postprocedural states: Secondary | ICD-10-CM | POA: Diagnosis present

## 2021-03-12 NOTE — Therapy (Signed)
OUTPATIENT PHYSICAL THERAPY Re-Certification Note   Patient Name: Veronica Hunter MRN: [id number] DOB:April 26, 1970, 51 y.o., female Today's Date: 03/12/2021  PCP: Mattie Marlin, DO REFERRING PROVIDER: Jacinta Shoe, PA-C   PT End of Session - 03/12/21 1018     Visit Number 2    Number of Visits 17    Date for PT Re-Evaluation 05/07/21    Authorization Type U[employer]; recertification 03/12/20    PT Start Time 0935    PT Stop Time 1015    PT Time Calculation (min) 40 min    Activity Tolerance Patient tolerated treatment well    Behavior During Therapy [facility] for tasks assessed/performed             Past Medical History:  Diagnosis Date   H/O fracture 2020   left foot, right ankle   Past Surgical History:  Procedure Laterality Date   GALLBLADDER SURGERY  1996   right ankle fracture surgery  2020   2 plates/10 screws    right ankle tendon repair  2010   TONSILLECTOMY  1996   TOTAL VAGINAL HYSTERECTOMY  2009   Patient Active Problem List   Diagnosis Date Noted   Myofascial pain syndrome, cervical 02/12/2021   Dizziness 04/07/2020   Cervicalgia 04/07/2020    REFERRING DIAG: M12.571 (ICD-10-CM) - Traumatic arthropathy, right ankle and foot   THERAPY DIAG:  Unsteadiness on feet  Difficulty in walking, not elsewhere classified  History of ankle surgery  Pain in right ankle and joints of right foot  Pain in left ankle and joints of left foot  PERTINENT HISTORY: Previous ankle surgery, hx of vertigo  ONSET DATE: 12/15/20  PRECAUTIONS: none  SUBJECTIVE: My insurance didn't approve more session after evaluation so I had to wait. Pain is 8/10 right now.  PAIN:  Are you having pain? Yes VAS scale: 8/10 Pain location: Lateral  Pain orientation: Right, Medial, Lateral, and Anterior  PAIN TYPE: aching, burning, sharp, throbbing, and tight Pain description: constant and sharp  Aggravating factors: WB, movement Relieving factors: rest   LE AROM/PROM:   A/PROM  Right 03/12/21 Left 03/12/21 Right   Ankle dorsiflexion 0 15    Ankle plantarflexion 25 55    Ankle inversion 2 45    Ankle eversion 6 30     (Blank rows = not tested)   LE MMT:   MMT Right 03/12/21 Left 03/12/21  Ankle dorsiflexion 5 5  Ankle plantarflexion 2+ 5  Ankle inversion 1+ 5  Ankle eversion 1+ 55   (Blank rows = not tested)     JOINT MOBILITY ASSESSMENT:  Decreased subtalar, mid foot and forefoot mobility grossly in R ankle/foot Functional testing:01/15/21 - 10 meter walk test: 0.39 m/s with st. cane - 5x sit to stand: 15.35 sec without HHA - Modified CTSIB: 120/120 seconds - SLS: R LE: 0 sec; L LE: 11 sec     TODAY'S TREATMENT: 03/12/20: Soft tissue massage to anterior, medial and lateral ankle Passive stretching of foot into inversion, eversion, and plantarflexion Manually resisted ankle DF and PF: 20x Supine hooklying with ball between knees (to reduce hip compensation) ankle inversion and  Eversion: 3 x 20 bil, tactle cues to reduce compensation and improve ankle isolation Supine hooklying bil plantarflexion: 2 x 20      PATIENT EDUCATION:  Education details: Educated on pain management with ice and exercises Person educated: Patient Education method: Explanation Education comprehension: verbalized understanding     HOME EXERCISE PROGRAM: Access Code [email or web address]  ASSESSMENT:   CLINICAL IMPRESSION: Pt had to wait since intial evaluation due to insurance reasons which delayed her therapy post op. Pt's ROM was no difference since evaluation pre session. Pt reported pain decreased to <8/10 and not as intesne after manual therapy and exercises. Patient will continue to benefit from skilled PT to address her ROM, strength, and balance to improve overall function.    REHAB POTENTIAL: Good   CLINICAL DECISION MAKING: Stable/uncomplicated   EVALUATION COMPLEXITY: Low     GOALS: Goals reviewed with patient? Yes   SHORT TERM GOALS:   STG Name Target Date Goal  status  1 Pt will be able to resume driving with <2/87 pain in R ankle Baseline: has not resumed driving (eval) 08/15/1155  On going  2 Pt will demo 5 deg of AROM of R ankle dorsiflexion to improve gait Baseline: 0 deg (eval) 04/09/2021 On going  3 Pt will demo 10 deg of ankle inversion in R ankle to improve ability to pivot on R LE Baseline:2 deg (eval) 04/09/2021 On going  4 Pt will be able to perform 5x sit to stand <12 sec without HHA to improve functional strength of LE Baseline: 15 sec (eval) 04/09/2021 On going    LONG TERM GOALS:    LTG Name Target Date Goal status  1 Pt will demo gait speed without AD that is >1 m/s to improve community ambulation Baseline:0.68m/s with st. Cane (Eval) 05/07/2021  INITIAL  2 Pt will be able to perform 10 unilateral heel raises to improve strength of plantarflexors in R LE Baseline: pt unable to perform standing unilateral heel raise (eva) 05/07/2021 INITIAL  3 Pt will be able to ambulate 400 feet on grass without AD and without pain in ankle to improve community access Baseline: not attempted (eval) 05/07/2021 INITIAL    PLAN: PT FREQUENCY: 2x/week   PT DURATION: 8 weeks   PLANNED INTERVENTIONS: Therapeutic exercises, Therapeutic activity, Neuro Muscular re-education, Balance training, Gait training, Patient/Family education, Joint mobilization, Stair training, Dry Needling, Cryotherapy, Moist heat, scar mobilization, Taping, and Manual therapy   PLAN FOR NEXT SESSION: Bil heel raises, manual mobilization to R ankle, review HEP      Ileana Ladd, PT 03/12/2021, 10:19 AM

## 2021-03-14 ENCOUNTER — Ambulatory Visit: Payer: 59

## 2021-03-14 ENCOUNTER — Other Ambulatory Visit: Payer: Self-pay

## 2021-03-14 DIAGNOSIS — M25572 Pain in left ankle and joints of left foot: Secondary | ICD-10-CM

## 2021-03-14 DIAGNOSIS — R2681 Unsteadiness on feet: Secondary | ICD-10-CM | POA: Diagnosis not present

## 2021-03-14 DIAGNOSIS — R262 Difficulty in walking, not elsewhere classified: Secondary | ICD-10-CM

## 2021-03-14 DIAGNOSIS — M25571 Pain in right ankle and joints of right foot: Secondary | ICD-10-CM

## 2021-03-14 NOTE — Therapy (Signed)
OUTPATIENT PHYSICAL THERAPY Re-Certification Note   Patient Name: Veronica Hunter MRN: [id number] DOB:1970/03/24, 51 y.o., female Today's Date: 03/14/2021  PCP: Mattie Marlin, DO REFERRING PROVIDER: Jacinta Shoe, PA-C   PT End of Session - 03/14/21 0950     Visit Number 3    Number of Visits 17    Date for PT Re-Evaluation 05/07/21    Authorization Type [employer]; recertification 03/12/20    PT Start Time 0930    PT Stop Time 1015    PT Time Calculation (min) 45 min    Activity Tolerance Patient tolerated treatment well    Behavior During Therapy [facility] for tasks assessed/performed             Past Medical History:  Diagnosis Date   H/O fracture 2020   left foot, right ankle   Past Surgical History:  Procedure Laterality Date   GALLBLADDER SURGERY  1996   right ankle fracture surgery  2020   2 plates/10 screws    right ankle tendon repair  2010   TONSILLECTOMY  1996   TOTAL VAGINAL HYSTERECTOMY  2009   Patient Active Problem List   Diagnosis Date Noted   Myofascial pain syndrome, cervical 02/12/2021   Dizziness 04/07/2020   Cervicalgia 04/07/2020    REFERRING DIAG: M12.571 (ICD-10-CM) - Traumatic arthropathy, right ankle and foot   THERAPY DIAG:  Unsteadiness on feet  Difficulty in walking, not elsewhere classified  Pain in left ankle and joints of left foot  Pain in right ankle and joints of right foot  PERTINENT HISTORY: Previous ankle surgery, hx of vertigo  ONSET DATE: 12/15/20  PRECAUTIONS: none  SUBJECTIVE: It felt good for about a day but the night after last session, I stood up and my ankle locked up. Since then I have a little limp because it feels tight.  PAIN:  Are you having pain? Yes VAS scale: 8/10 Pain location: Lateral  Pain orientation: Right, Medial, Lateral, and Anterior  PAIN TYPE: aching, burning, sharp, throbbing, and tight Pain description: constant and sharp  Aggravating factors: WB, movement Relieving factors: rest   LE  AROM/PROM:   A/PROM Right 03/12/21 Left 03/12/21 Right   Ankle dorsiflexion 0 15    Ankle plantarflexion 25 55    Ankle inversion 2 45    Ankle eversion 6 30     (Blank rows = not tested)   LE MMT:   MMT Right 03/12/21 Left 03/12/21  Ankle dorsiflexion 5 5  Ankle plantarflexion 2+ 5  Ankle inversion 1+ 5  Ankle eversion 1+ 55   (Blank rows = not tested)   Functional testing:01/15/21 - 10 meter walk test: 0.39 m/s with st. cane - 5x sit to stand: 15.35 sec without HHA - Modified CTSIB: 120/120 seconds - SLS: R LE: 0 sec; L LE: 11 sec     TODAY'S TREATMENT: 03/14/21: Soft tissue massage to anterior, medial and lateral ankle Passive stretching of foot into inversion, eversion, and plantarflexion Subtalar distraction and medial and lateral tilts: grade I-II Grade I-II long axis traction of phalanges in R  foot Standing bil and uni on R gastroc stretching: 3 x 30" each Standing with R foot on tilt board: AP  tilts: 20x R only Standing ML tilts on tilt board: bil: 20x R and L Bil leg press: 70lbs 10x Uni leg press: 40 lbs 10x R and L  03/12/20: Soft tissue massage to anterior, medial and lateral ankle Passive stretching of foot into inversion, eversion, and plantarflexion  Manually resisted ankle DF and PF: 20x Supine hooklying with ball between knees (to reduce hip compensation) ankle inversion and  Eversion: 3 x 20 bil, tactle cues to reduce compensation and improve ankle isolation Supine hooklying bil plantarflexion: 2 x 20      PATIENT EDUCATION:  Education details: Educated on pain management with ice and exercises Person educated: Patient Education method: Explanation Education comprehension: verbalized understanding     HOME EXERCISE PROGRAM: Access Code [id number]   ASSESSMENT:   CLINICAL IMPRESSION: Pt tolerated session well. Pt demo decreased WB on R LE when standing or walking due to pain in R ankle. Pt is guarded with light soft tissue work and mobilization due  to pain. R ankle had more mobility at end of the session but plantarflexion was more limited today than last session.   REHAB POTENTIAL: Good   CLINICAL DECISION MAKING: Stable/uncomplicated   EVALUATION COMPLEXITY: Low     GOALS: Goals reviewed with patient? Yes   SHORT TERM GOALS:   STG Name Target Date Goal status  1 Pt will be able to resume driving with <0/35 pain in R ankle Baseline: has not resumed driving (eval) 0/0/9381  On going  2 Pt will demo 5 deg of AROM of R ankle dorsiflexion to improve gait Baseline: 0 deg (eval) 04/09/2021 On going  3 Pt will demo 10 deg of ankle inversion in R ankle to improve ability to pivot on R LE Baseline:2 deg (eval) 04/09/2021 On going  4 Pt will be able to perform 5x sit to stand <12 sec without HHA to improve functional strength of LE Baseline: 15 sec (eval) 04/09/2021 On going    LONG TERM GOALS:    LTG Name Target Date Goal status  1 Pt will demo gait speed without AD that is >1 m/s to improve community ambulation Baseline:0.64m/s with st. Cane (Eval) 05/07/2021  INITIAL  2 Pt will be able to perform 10 unilateral heel raises to improve strength of plantarflexors in R LE Baseline: pt unable to perform standing unilateral heel raise (eva) 05/07/2021 INITIAL  3 Pt will be able to ambulate 400 feet on grass without AD and without pain in ankle to improve community access Baseline: not attempted (eval) 05/07/2021 INITIAL    PLAN: PT FREQUENCY: 2x/week   PT DURATION: 8 weeks   PLANNED INTERVENTIONS: Therapeutic exercises, Therapeutic activity, Neuro Muscular re-education, Balance training, Gait training, Patient/Family education, Joint mobilization, Stair training, Dry Needling, Cryotherapy, Moist heat, scar mobilization, Taping, and Manual therapy   PLAN FOR NEXT SESSION: Bil heel raises, manual mobilization to R ankle, review HEP      Ileana Ladd, PT 03/14/2021, 9:53 AM

## 2021-03-19 ENCOUNTER — Ambulatory Visit: Payer: 59

## 2021-03-21 ENCOUNTER — Other Ambulatory Visit: Payer: Self-pay

## 2021-03-21 ENCOUNTER — Ambulatory Visit: Payer: 59

## 2021-03-21 DIAGNOSIS — R2681 Unsteadiness on feet: Secondary | ICD-10-CM

## 2021-03-21 DIAGNOSIS — R262 Difficulty in walking, not elsewhere classified: Secondary | ICD-10-CM

## 2021-03-21 DIAGNOSIS — M25572 Pain in left ankle and joints of left foot: Secondary | ICD-10-CM

## 2021-03-21 DIAGNOSIS — M25571 Pain in right ankle and joints of right foot: Secondary | ICD-10-CM

## 2021-03-21 DIAGNOSIS — Z9889 Other specified postprocedural states: Secondary | ICD-10-CM

## 2021-03-21 NOTE — Therapy (Signed)
OUTPATIENT PHYSICAL THERAPY Re-Certification Note   Patient Name: Veronica Hunter MRN: [id number] DOB:Jul 20, 1970, 51 y.o., female Today's Date: 03/21/2021  PCP: Percell Belt, DO REFERRING PROVIDER: Corky Sing, PA-C   PT End of Session - 03/21/21 1038     Visit Number 4    Number of Visits 17    Date for PT Re-Evaluation 05/07/21    Authorization Type U[employer]; recertification [id number]    PT Start Time 1025    PT Stop Time 1105    PT Time Calculation (min) 40 min    Activity Tolerance Patient tolerated treatment well    Behavior During Therapy [facility]FL for tasks assessed/performed             Past Medical History:  Diagnosis Date   H/O fracture 2020   left foot, right ankle   Past Surgical History:  Procedure Laterality Date   GALLBLADDER SURGERY  1996   right ankle fracture surgery  2020   2 plates/10 screws    right ankle tendon repair  2010   [place]   TOTAL VAGINAL HYSTERECTOMY  2009   Patient Active Problem List   Diagnosis Date Noted   Myofascial pain syndrome, cervical 02/12/2021   Dizziness 04/07/2020   Cervicalgia 04/07/2020    REFERRING DIAG: M12.571 (ICD-10-CM) - Traumatic arthropathy, right ankle and foot   THERAPY DIAG:  Unsteadiness on feet  Difficulty in walking, not elsewhere classified  Pain in left ankle and joints of left foot  Pain in right ankle and joints of right foot  History of ankle surgery  PERTINENT HISTORY: Previous ankle surgery, hx of vertigo  ONSET DATE: 12/15/20  PRECAUTIONS: none  SUBJECTIVE: It had to help my daughter move. I tired not to do too much so I didn't have any flare ups. It still feels tight.  PAIN:  Are you having pain? Yes VAS scale: 3/10 Pain location: Lateral  Pain orientation: Right, Medial, Lateral, and Anterior  PAIN TYPE: aching, burning, sharp, throbbing, and tight Pain description: constant and sharp  Aggravating factors: WB, movement Relieving factors: rest   LE  AROM/PROM:   A/PROM Right 03/12/21 Left 03/12/21 Right   Ankle dorsiflexion 0 15    Ankle plantarflexion 25 55    Ankle inversion 2 45    Ankle eversion 6 30     (Blank rows = not tested)   LE MMT:   MMT Right 03/12/21 Left 03/12/21  Ankle dorsiflexion 5 5  Ankle plantarflexion 2+ 5  Ankle inversion 1+ 5  Ankle eversion 1+ 55   (Blank rows = not tested)   Functional testing:01/15/21 - 10 meter walk test: 0.39 m/s with st. cane - 5x sit to stand: 15.35 sec without HHA - Modified CTSIB: 120/120 seconds - SLS: R LE: 0 sec; L LE: 11 sec     TODAY'S TREATMENT: 03/21/20 Soft tissue massage to anterior, medial and lateral ankle, peroneal tendons, plantar fascia, intertarsal muscles Passive stretching of foot into inversion, eversion, and plantarflexion Toe spreading: 20x Toe curs and extensions: 20x Ankle pumps: 30x Standing on foam: narrow BOS: 60 sec EO, 60 sec EC Standing on 2 dyna discs: EO: 2' Bil leg press: 70lbs 2 x 10x Uni leg press: 40 lbs 2 x 10x R and L Unilateral gastroc stretch with inversion bias: 2 x 30" R only  03/14/21: Soft tissue massage to anterior, medial and lateral ankle Passive stretching of foot into inversion, eversion, and plantarflexion Subtalar distraction and medial and lateral tilts: grade  I-II Grade I-II long axis traction of phalanges in R  foot Standing bil and uni on R gastroc stretching: 3 x 30" each Standing with R foot on tilt board: AP  tilts: 20x R only Standing ML tilts on tilt board: bil: 20x R and L Bil leg press: 70lbs  x 10 Uni leg press: 40 lbs 10x R and L Standing on   03/12/20: Soft tissue massage to anterior, medial and lateral ankle Passive stretching of foot into inversion, eversion, and plantarflexion Manually resisted ankle DF and PF: 20x Supine hooklying with ball between knees (to reduce hip compensation) ankle inversion and  Eversion: 3 x 20 bil, tactle cues to reduce compensation and improve ankle isolation Supine  hooklying bil plantarflexion: 2 x 20      PATIENT EDUCATION:  Education details: Educated on pain management with ice and exercises Person educated: Patient Education method: Explanation Education comprehension: verbalized understanding     HOME EXERCISE PROGRAM: Access Code [id number] 03/21/21: Pt asked to do toe spreading and toe curling exercises   ASSESSMENT:   CLINICAL IMPRESSION: Pt is demonstrating limied plantarflexion. With end range active plantarflexion, pt is reporting muscle cramping in plantar aspect of foot due to tight muscles and not reporting stretching on top of the foot. Patient has poor motor control in foot and ankle. R foot muscles are not very active with balance exercises compared to L foot. Pt still tends to have more WB on L LE compared to R LE when standing and needs cueing to correct it.   REHAB POTENTIAL: Good   CLINICAL DECISION MAKING: Stable/uncomplicated   EVALUATION COMPLEXITY: Low     GOALS: Goals reviewed with patient? Yes   SHORT TERM GOALS:   STG Name Target Date Goal status  1 Pt will be able to resume driving with [id number] pain in R ankle Baseline: has not resumed driving (eval) [id number]  On going  2 Pt will demo 5 deg of AROM of R ankle dorsiflexion to improve gait Baseline: 0 deg (eval) 04/09/2021 On going  3 Pt will demo 10 deg of ankle inversion in R ankle to improve ability to pivot on R LE Baseline:2 deg (eval) 04/09/2021 On going  4 Pt will be able to perform 5x sit to stand <12 sec without HHA to improve functional strength of LE Baseline: 15 sec (eval) 04/09/2021 On going    LONG TERM GOALS:    LTG Name Target Date Goal status  1 Pt will demo gait speed without AD that is >1 m/s to improve community ambulation Baseline:0.79m/s with st. Cane (Eval) 05/07/2021  INITIAL  2 Pt will be able to perform 10 unilateral heel raises to improve strength of plantarflexors in R LE Baseline: pt unable to perform standing unilateral heel raise  (eva) 05/07/2021 INITIAL  3 Pt will be able to ambulate 400 feet on grass without AD and without pain in ankle to improve community access Baseline: not attempted (eval) 05/07/2021 INITIAL    PLAN: PT FREQUENCY: 2x/week   PT DURATION: 8 weeks   PLANNED INTERVENTIONS: Therapeutic exercises, Therapeutic activity, Neuro Muscular re-education, Balance training, Gait training, Patient/Family education, Joint mobilization, Stair training, Dry Needling, Cryotherapy, Moist heat, scar mobilization, Taping, and Manual therapy   PLAN FOR NEXT SESSION: Bil heel raises, manual mobilization to R ankle, review HEP      Kerrie Pleasure, PT 03/21/2021, 10:39 AM

## 2021-03-26 ENCOUNTER — Ambulatory Visit: Payer: 59

## 2021-03-26 ENCOUNTER — Other Ambulatory Visit: Payer: Self-pay

## 2021-03-26 DIAGNOSIS — R262 Difficulty in walking, not elsewhere classified: Secondary | ICD-10-CM

## 2021-03-26 DIAGNOSIS — Z9889 Other specified postprocedural states: Secondary | ICD-10-CM

## 2021-03-26 DIAGNOSIS — M25571 Pain in right ankle and joints of right foot: Secondary | ICD-10-CM

## 2021-03-26 DIAGNOSIS — M25572 Pain in left ankle and joints of left foot: Secondary | ICD-10-CM

## 2021-03-26 DIAGNOSIS — R2681 Unsteadiness on feet: Secondary | ICD-10-CM

## 2021-03-26 NOTE — Therapy (Signed)
OUTPATIENT PHYSICAL THERAPY Re-Certification Note   Patient Name: Veronica Hunter MRN: [id number] DOB:03-Feb-1971, 51 y.o., female Today's Date: 03/26/2021  PCP: Percell Belt, DO REFERRING PROVIDER: Corky Sing, PA-C   PT End of Session - 03/26/21 1040     Visit Number 5    Number of Visits 17    Date for PT Re-Evaluation 05/07/21    Authorization Type U[employer]; recertification [id number]    PT Start Time [id number]    PT Stop Time 1100    PT Time Calculation (min) 45 min    Activity Tolerance Patient tolerated treatment well    Behavior During Therapy [facility]FL for tasks assessed/performed             Past Medical History:  Diagnosis Date   H/O fracture 2020   left foot, right ankle   Past Surgical History:  Procedure Laterality Date   GALLBLADDER SURGERY  1996   right ankle fracture surgery  2020   2 plates/10 screws    right ankle tendon repair  2010   [place]   TOTAL VAGINAL HYSTERECTOMY  2009   Patient Active Problem List   Diagnosis Date Noted   Myofascial pain syndrome, cervical 02/12/2021   Dizziness 04/07/2020   Cervicalgia 04/07/2020    REFERRING DIAG: M12.571 (ICD-10-CM) - Traumatic arthropathy, right ankle and foot   THERAPY DIAG:  Unsteadiness on feet  Difficulty in walking, not elsewhere classified  Pain in left ankle and joints of left foot  Pain in right ankle and joints of right foot  History of ankle surgery  PERTINENT HISTORY: Previous ankle surgery, hx of vertigo  ONSET DATE: 12/15/20  PRECAUTIONS: none  SUBJECTIVE: Symptoms are on and off.  PAIN:  Are you having pain? Yes VAS scale: 3/10 Pain location: Lateral  Pain orientation: Right, Medial, Lateral, and Anterior  PAIN TYPE: aching, burning, sharp, throbbing, and tight Pain description: constant and sharp  Aggravating factors: WB, movement Relieving factors: rest   LE AROM/PROM:   A/PROM Right 03/12/21 Left 03/12/21 Right   Ankle dorsiflexion 0 15    Ankle  plantarflexion 25 55    Ankle inversion 2 45    Ankle eversion 6 30     (Blank rows = not tested)   LE MMT:   MMT Right 03/12/21 Left 03/12/21  Ankle dorsiflexion 5 5  Ankle plantarflexion 2+ 5  Ankle inversion 1+ 5  Ankle eversion 1+ 55   (Blank rows = not tested)   Functional testing:01/15/21 - 10 meter walk test: 0.39 m/s with st. cane - 5x sit to stand: 15.35 sec without HHA - Modified CTSIB: 120/120 seconds - SLS: R LE: 0 sec; L LE: 11 sec     TODAY'S TREATMENT:  03/26/21: Soft tissue massage to anterior, medial and lateral ankle, peroneal tendons, plantar fascia, intertarsal muscles Passive stretching of foot into inversion, eversion, and plantarflexion Toe spreading: 50x Toe curs and extensions: 50x Sidelying ankle inversion and eversion: 3 x 10 R only  03/21/20 Soft tissue massage to anterior, medial and lateral ankle, peroneal tendons, plantar fascia, intertarsal muscles Passive stretching of foot into inversion, eversion, and plantarflexion Toe spreading: 20x Toe curs and extensions: 20x Ankle pumps: 30x Standing on foam: narrow BOS: 60 sec EO, 60 sec EC Standing on 2 dyna discs: EO: 2' Bil leg press: 70lbs 2 x 10x Uni leg press: 40 lbs 2 x 10x R and L Unilateral gastroc stretch with inversion bias: 2 x 30" R only  03/14/21: Soft  tissue massage to anterior, medial and lateral ankle Passive stretching of foot into inversion, eversion, and plantarflexion Subtalar distraction and medial and lateral tilts: grade I-II Grade I-II long axis traction of phalanges in R  foot Standing bil and uni on R gastroc stretching: 3 x 30" each Standing with R foot on tilt board: AP  tilts: 20x R only Standing ML tilts on tilt board: bil: 20x R and L Bil leg press: 70lbs  x 10 Uni leg press: 40 lbs 10x R and L Standing on   03/12/20: Soft tissue massage to anterior, medial and lateral ankle Passive stretching of foot into inversion, eversion, and plantarflexion Manually resisted  ankle DF and PF: 20x Supine hooklying with ball between knees (to reduce hip compensation) ankle inversion and  Eversion: 3 x 20 bil, tactle cues to reduce compensation and improve ankle isolation Supine hooklying bil plantarflexion: 2 x 20      PATIENT EDUCATION:  Education details: Educated on pain management with ice and exercises Person educated: Patient Education method: Explanation Education comprehension: verbalized understanding     HOME EXERCISE PROGRAM: Access Code [id number] 03/21/21: Pt asked to do toe spreading and toe curling exercises   ASSESSMENT:   CLINICAL IMPRESSION: Pt tolerated session well. ROM in foot is gradually improving.    REHAB POTENTIAL: Good   CLINICAL DECISION MAKING: Stable/uncomplicated   EVALUATION COMPLEXITY: Low     GOALS: Goals reviewed with patient? Yes   SHORT TERM GOALS:   STG Name Target Date Goal status  1 Pt will be able to resume driving with [id number] pain in R ankle Baseline: has not resumed driving (eval) [id number]  On going  2 Pt will demo 5 deg of AROM of R ankle dorsiflexion to improve gait Baseline: 0 deg (eval) 04/09/2021 On going  3 Pt will demo 10 deg of ankle inversion in R ankle to improve ability to pivot on R LE Baseline:2 deg (eval) 04/09/2021 On going  4 Pt will be able to perform 5x sit to stand <12 sec without HHA to improve functional strength of LE Baseline: 15 sec (eval) 04/09/2021 On going    LONG TERM GOALS:    LTG Name Target Date Goal status  1 Pt will demo gait speed without AD that is >1 m/s to improve community ambulation Baseline:0.63m/s with st. Cane (Eval) 05/07/2021  INITIAL  2 Pt will be able to perform 10 unilateral heel raises to improve strength of plantarflexors in R LE Baseline: pt unable to perform standing unilateral heel raise (eva) 05/07/2021 INITIAL  3 Pt will be able to ambulate 400 feet on grass without AD and without pain in ankle to improve community access Baseline: not attempted (eval)  05/07/2021 INITIAL    PLAN: PT FREQUENCY: 2x/week   PT DURATION: 8 weeks   PLANNED INTERVENTIONS: Therapeutic exercises, Therapeutic activity, Neuro Muscular re-education, Balance training, Gait training, Patient/Family education, Joint mobilization, Stair training, Dry Needling, Cryotherapy, Moist heat, scar mobilization, Taping, and Manual therapy   PLAN FOR NEXT SESSION: Bil heel raises, manual mobilization to R ankle, review HEP      Kerrie Pleasure, PT 03/26/2021, 10:41 AM

## 2021-03-28 ENCOUNTER — Ambulatory Visit: Payer: 59

## 2021-03-28 ENCOUNTER — Other Ambulatory Visit: Payer: Self-pay

## 2021-03-28 DIAGNOSIS — R2681 Unsteadiness on feet: Secondary | ICD-10-CM | POA: Diagnosis not present

## 2021-03-28 DIAGNOSIS — M25571 Pain in right ankle and joints of right foot: Secondary | ICD-10-CM

## 2021-03-28 DIAGNOSIS — Z9889 Other specified postprocedural states: Secondary | ICD-10-CM

## 2021-03-28 DIAGNOSIS — R262 Difficulty in walking, not elsewhere classified: Secondary | ICD-10-CM

## 2021-03-28 DIAGNOSIS — M25572 Pain in left ankle and joints of left foot: Secondary | ICD-10-CM

## 2021-03-28 NOTE — Therapy (Signed)
OUTPATIENT PHYSICAL THERAPY Re-Certification Note   Patient Name: Veronica Hunter MRN: [id number] DOB:[birth year], 51 y.o., female Today's Date: 03/28/2021  PCP: Mattie Marlin, DO REFERRING PROVIDER: Jacinta Shoe, PA-C   PT End of Session - 03/28/21 1000     Visit Number 6    Number of Visits 17    Date for PT Re-Evaluation 05/07/21    Authorization Type [employer]; recertification 03/12/20    PT Start Time 0930    PT Stop Time 1015    PT Time Calculation (min) 45 min    Activity Tolerance Patient tolerated treatment well    Behavior During Therapy [facility] for tasks assessed/performed             Past Medical History:  Diagnosis Date   H/O fracture 2020   left foot, right ankle   Past Surgical History:  Procedure Laterality Date   GALLBLADDER SURGERY  1996   right ankle fracture surgery  2020   2 plates/10 screws    right ankle tendon repair  2010   TONSILLECTOMY  1996   TOTAL VAGINAL HYSTERECTOMY  2009   Patient Active Problem List   Diagnosis Date Noted   Myofascial pain syndrome, cervical 02/12/2021   Dizziness 04/07/2020   Cervicalgia 04/07/2020    REFERRING DIAG: M12.571 (ICD-10-CM) - Traumatic arthropathy, right ankle and foot   THERAPY DIAG:  Unsteadiness on feet  Difficulty in walking, not elsewhere classified  Pain in left ankle and joints of left foot  Pain in right ankle and joints of right foot  History of ankle surgery  PERTINENT HISTORY: Previous ankle surgery, hx of vertigo  ONSET DATE: 12/15/20  PRECAUTIONS: none  SUBJECTIVE: I felt better for the day after but yesterday my back started huring and I M feeling pain from R side of back down to R knee in the back  PAIN:  Are you having pain? Yes VAS scale: 6/10 Pain location: ankle, back Pain orientation: Right, Medial, Lateral, and Anterior  PAIN TYPE: aching, burning, sharp, throbbing, and tight Pain description: constant and sharp  Aggravating factors: WB, movement Relieving  factors: rest   LE AROM/PROM:   A/PROM Right 03/12/21 Left 03/12/21 Right   Ankle dorsiflexion 0 15    Ankle plantarflexion 25 55    Ankle inversion 2 45    Ankle eversion 6 30     (Blank rows = not tested)   LE MMT:   MMT Right 03/12/21 Left 03/12/21  Ankle dorsiflexion 5 5  Ankle plantarflexion 2+ 5  Ankle inversion 1+ 5  Ankle eversion 1+ 55   (Blank rows = not tested)   Functional testing:01/15/21 - 10 meter walk test: 0.39 m/s with st. cane - 5x sit to stand: 15.35 sec without HHA - Modified CTSIB: 120/120 seconds - SLS: R LE: 0 sec; L LE: 11 sec     TODAY'S TREATMENT:  03/28/21: Soft tissue massage to anterior, medial and lateral ankle, peroneal tendons, plantar fascia, intertarsal muscles Passive stretching of foot into inversion, eversion, and plantarflexion Soft tissue massage to R lumbar spinalis and multifidus and R gluts L sidelying, R knee to chest: 2 x 10 Sidelying ankle inversion and eversion: 3 x 10 R only Bil leg press: 80lbs 2 x 10 Uni leg press: 50lb2 x 10 03/26/21: Soft tissue massage to anterior, medial and lateral ankle, peroneal tendons, plantar fascia, intertarsal muscles Passive stretching of foot into inversion, eversion, and plantarflexion Toe spreading: 50x Toe curs and extensions: 50x Sidelying ankle inversion  and eversion: 3 x 10 R only  03/21/20 Soft tissue massage to anterior, medial and lateral ankle, peroneal tendons, plantar fascia, intertarsal muscles Passive stretching of foot into inversion, eversion, and plantarflexion Toe spreading: 20x Toe curs and extensions: 20x Ankle pumps: 30x Standing on foam: narrow BOS: 60 sec EO, 60 sec EC Standing on 2 dyna discs: EO: 2' Bil leg press: 70lbs 2 x 10x Uni leg press: 40 lbs 2 x 10x R and L Unilateral gastroc stretch with inversion bias: 2 x 30" R only  03/14/21: Soft tissue massage to anterior, medial and lateral ankle Passive stretching of foot into inversion, eversion, and  plantarflexion Subtalar distraction and medial and lateral tilts: grade I-II Grade I-II long axis traction of phalanges in R  foot Standing bil and uni on R gastroc stretching: 3 x 30" each Standing with R foot on tilt board: AP  tilts: 20x R only Standing ML tilts on tilt board: bil: 20x R and L Bil leg press: 70lbs  x 10 Uni leg press: 40 lbs 10x R and L Standing on   03/12/20: Soft tissue massage to anterior, medial and lateral ankle Passive stretching of foot into inversion, eversion, and plantarflexion Manually resisted ankle DF and PF: 20x Supine hooklying with ball between knees (to reduce hip compensation) ankle inversion and  Eversion: 3 x 20 bil, tactle cues to reduce compensation and improve ankle isolation Supine hooklying bil plantarflexion: 2 x 20      PATIENT EDUCATION:  Education details: Educated on pain management with ice and exercises Person educated: Patient Education method: Explanation Education comprehension: verbalized understanding     HOME EXERCISE PROGRAM: Access Code [email or web address] 03/21/21: Pt asked to do toe spreading and toe curling exercises   ASSESSMENT:   CLINICAL IMPRESSION: As patient's gait is changing she is most likely compensating and potentially feeling pain in her back. Her back was addressed today to reduce compensation with walking and causing increased pain in her R ankle.    REHAB POTENTIAL: Good   CLINICAL DECISION MAKING: Stable/uncomplicated   EVALUATION COMPLEXITY: Low     GOALS: Goals reviewed with patient? Yes   SHORT TERM GOALS:   STG Name Target Date Goal status  1 Pt will be able to resume driving with <6/29 pain in R ankle Baseline: has not resumed driving (eval) 06/13/6544  On going  2 Pt will demo 5 deg of AROM of R ankle dorsiflexion to improve gait Baseline: 0 deg (eval) 04/09/2021 On going  3 Pt will demo 10 deg of ankle inversion in R ankle to improve ability to pivot on R LE Baseline:2 deg (eval) 04/09/2021 On  going  4 Pt will be able to perform 5x sit to stand <12 sec without HHA to improve functional strength of LE Baseline: 15 sec (eval) 04/09/2021 On going    LONG TERM GOALS:    LTG Name Target Date Goal status  1 Pt will demo gait speed without AD that is >1 m/s to improve community ambulation Baseline:0.55m/s with st. Cane (Eval) 05/07/2021  INITIAL  2 Pt will be able to perform 10 unilateral heel raises to improve strength of plantarflexors in R LE Baseline: pt unable to perform standing unilateral heel raise (eva) 05/07/2021 INITIAL  3 Pt will be able to ambulate 400 feet on grass without AD and without pain in ankle to improve community access Baseline: not attempted (eval) 05/07/2021 INITIAL    PLAN: PT FREQUENCY: 2x/week   PT DURATION: 8 weeks  PLANNED INTERVENTIONS: Therapeutic exercises, Therapeutic activity, Neuro Muscular re-education, Balance training, Gait training, Patient/Family education, Joint mobilization, Stair training, Dry Needling, Cryotherapy, Moist heat, scar mobilization, Taping, and Manual therapy   PLAN FOR NEXT SESSION: Bil heel raises, manual mobilization to R ankle, review HEP      Ileana LaddKarmesh N Miabella Shannahan, PT 03/28/2021, 10:13 AM

## 2021-04-02 ENCOUNTER — Ambulatory Visit: Payer: 59

## 2021-04-04 ENCOUNTER — Ambulatory Visit: Payer: 59

## 2021-04-09 ENCOUNTER — Ambulatory Visit: Payer: 59 | Attending: Family Medicine

## 2021-04-09 ENCOUNTER — Other Ambulatory Visit: Payer: Self-pay

## 2021-04-09 DIAGNOSIS — M25571 Pain in right ankle and joints of right foot: Secondary | ICD-10-CM | POA: Insufficient documentation

## 2021-04-09 DIAGNOSIS — R262 Difficulty in walking, not elsewhere classified: Secondary | ICD-10-CM | POA: Diagnosis present

## 2021-04-09 DIAGNOSIS — M25572 Pain in left ankle and joints of left foot: Secondary | ICD-10-CM | POA: Insufficient documentation

## 2021-04-09 DIAGNOSIS — Z9889 Other specified postprocedural states: Secondary | ICD-10-CM | POA: Diagnosis present

## 2021-04-09 DIAGNOSIS — R2681 Unsteadiness on feet: Secondary | ICD-10-CM | POA: Diagnosis not present

## 2021-04-09 NOTE — Therapy (Signed)
OUTPATIENT PHYSICAL THERAPY Re-Certification Note   Patient Name: Veronica Hunter MRN: [id number] DOB:1971-02-14, 51 y.o., female Today's Date: 04/09/2021  PCP: Mattie Marlin, DO REFERRING PROVIDER: Jacinta Shoe, PA-C   PT End of Session - 04/09/21 [phone]     Visit Number 7    Number of Visits 17    Date for PT Re-Evaluation 05/07/21    Authorization Type [employer]; recertification 03/12/20    PT Start Time 0935    PT Stop Time 1015    PT Time Calculation (min) 40 min    Activity Tolerance Patient tolerated treatment well    Behavior During Therapy [facility] for tasks assessed/performed             Past Medical History:  Diagnosis Date   H/O fracture 2020   left foot, right ankle   Past Surgical History:  Procedure Laterality Date   GALLBLADDER SURGERY  1996   right ankle fracture surgery  2020   2 plates/10 screws    right ankle tendon repair  2010   TONSILLECTOMY  1996   TOTAL VAGINAL HYSTERECTOMY  2009   Patient Active Problem List   Diagnosis Date Noted   Myofascial pain syndrome, cervical 02/12/2021   Dizziness 04/07/2020   Cervicalgia 04/07/2020    REFERRING DIAG: M12.571 (ICD-10-CM) - Traumatic arthropathy, right ankle and foot   THERAPY DIAG:  Unsteadiness on feet  Difficulty in walking, not elsewhere classified  Pain in left ankle and joints of left foot  Pain in right ankle and joints of right foot  History of ankle surgery  PERTINENT HISTORY: Previous ankle surgery, hx of vertigo  ONSET DATE: 12/15/20  PRECAUTIONS: none  SUBJECTIVE: I had vertigo attack last Thursday and since then I am feeling little unsteadiness. I feel like when my R shoulder hurts, it goes down my arm and goes up to my neck and becomes vertigo.  PAIN:  Are you having pain? Yes VAS scale: 6/10 Pain location: ankle, back Pain orientation: Right, Medial, Lateral, and Anterior  PAIN TYPE: aching, burning, sharp, throbbing, and tight Pain description: constant and sharp   Aggravating factors: WB, movement Relieving factors: rest   LE AROM/PROM:   A/PROM Right 03/12/21 Left 03/12/21 Right   Ankle dorsiflexion 0 15    Ankle plantarflexion 25 55    Ankle inversion 2 45    Ankle eversion 6 30     (Blank rows = not tested)   LE MMT:   MMT Right 03/12/21 Left 03/12/21  Ankle dorsiflexion 5 5  Ankle plantarflexion 2+ 5  Ankle inversion 1+ 5  Ankle eversion 1+ 55   (Blank rows = not tested)   Functional testing:01/15/21 - 10 meter walk test: 0.39 m/s with st. cane - 5x sit to stand: 15.35 sec without HHA - Modified CTSIB: 120/120 seconds - SLS: R LE: 0 sec; L LE: 11 sec     TODAY'S TREATMENT:  04/09/21: Sci Fit: level 10 for 5' Wobble board: standing AP and Ml tilts: Unilateral R only,  20x each with PT blocking accessory motions from knee and hip to isolate ankle:  Tandem stance: DBS beam: 2 x 30" R and L Negative heel raises: 2" depth: 2 x 10bil Bil leg press: 80lbs 2 x 10 Uni leg press: 50lb2 x 10 Soft tissue massage to anterior, medial and lateral ankle, peroneal tendons, plantar fascia, intertarsal muscles Passive stretching of foot into inversion, eversion, and plantarflexion 03/28/21: Soft tissue massage to anterior, medial and lateral ankle, peroneal  tendons, plantar fascia, intertarsal muscles Passive stretching of foot into inversion, eversion, and plantarflexion Soft tissue massage to R lumbar spinalis and multifidus and R gluts L sidelying, R knee to chest: 2 x 10 Sidelying ankle inversion and eversion: 3 x 10 R only Bil leg press: 80lbs 2 x 10 Uni leg press: 50lb2 x 10 03/26/21: Soft tissue massage to anterior, medial and lateral ankle, peroneal tendons, plantar fascia, intertarsal muscles Passive stretching of foot into inversion, eversion, and plantarflexion Toe spreading: 50x Toe curs and extensions: 50x Sidelying ankle inversion and eversion: 3 x 10 R only       PATIENT EDUCATION:  Education details: Educated on pain  management with ice and exercises Person educated: Patient Education method: Explanation Education comprehension: verbalized understanding     HOME EXERCISE PROGRAM: Access Code [email or web address] 03/21/21: Pt asked to do toe spreading and toe curling exercises   ASSESSMENT:   CLINICAL IMPRESSION: Pt has been recently having flare up of her her vertigo. Noted improved R ankle inversion motion today with improved muscle control. Patient is still reporting allodynia with light pressure during soft tissue manipulation around the anterior and lateral ankle.   REHAB POTENTIAL: Good   CLINICAL DECISION MAKING: Stable/uncomplicated   EVALUATION COMPLEXITY: Low     GOALS: Goals reviewed with patient? Yes   SHORT TERM GOALS:   STG Name Target Date Goal status  1 Pt will be able to resume driving with <3/71 pain in R ankle Baseline: has not resumed driving (eval) 0/08/2692  On going  2 Pt will demo 5 deg of AROM of R ankle dorsiflexion to improve gait Baseline: 0 deg (eval) 04/09/2021 On going  3 Pt will demo 10 deg of ankle inversion in R ankle to improve ability to pivot on R LE Baseline:2 deg (eval) 04/09/2021 On going  4 Pt will be able to perform 5x sit to stand <12 sec without HHA to improve functional strength of LE Baseline: 15 sec (eval) 04/09/2021 On going    LONG TERM GOALS:    LTG Name Target Date Goal status  1 Pt will demo gait speed without AD that is >1 m/s to improve community ambulation Baseline:0.73m/s with st. Cane (Eval) 05/07/2021  INITIAL  2 Pt will be able to perform 10 unilateral heel raises to improve strength of plantarflexors in R LE Baseline: pt unable to perform standing unilateral heel raise (eva) 05/07/2021 INITIAL  3 Pt will be able to ambulate 400 feet on grass without AD and without pain in ankle to improve community access Baseline: not attempted (eval) 05/07/2021 INITIAL    PLAN: PT FREQUENCY: 2x/week   PT DURATION: 8 weeks   PLANNED INTERVENTIONS: Therapeutic  exercises, Therapeutic activity, Neuro Muscular re-education, Balance training, Gait training, Patient/Family education, Joint mobilization, Stair training, Dry Needling, Cryotherapy, Moist heat, scar mobilization, Taping, and Manual therapy   PLAN FOR NEXT SESSION: Bil heel raises, manual mobilization to R ankle, review HEP      Ileana Ladd, PT 04/09/2021, 9:43 AM

## 2021-04-11 ENCOUNTER — Ambulatory Visit: Payer: 59

## 2021-04-11 ENCOUNTER — Other Ambulatory Visit: Payer: Self-pay

## 2021-04-11 DIAGNOSIS — R262 Difficulty in walking, not elsewhere classified: Secondary | ICD-10-CM

## 2021-04-11 DIAGNOSIS — M25572 Pain in left ankle and joints of left foot: Secondary | ICD-10-CM

## 2021-04-11 DIAGNOSIS — R2681 Unsteadiness on feet: Secondary | ICD-10-CM

## 2021-04-11 DIAGNOSIS — Z9889 Other specified postprocedural states: Secondary | ICD-10-CM

## 2021-04-11 DIAGNOSIS — M25571 Pain in right ankle and joints of right foot: Secondary | ICD-10-CM

## 2021-04-11 NOTE — Therapy (Signed)
OUTPATIENT PHYSICAL THERAPY Re-Certification Note   Patient Name: Veronica Hunter MRN: [id number] DOB:10/31/1970, 51 y.o., female Today's Date: 04/11/2021  PCP: Mattie Marlin, DO REFERRING PROVIDER: Jacinta Shoe, PA-C   PT End of Session - 04/11/21 0944     Visit Number 8    Number of Visits 17    Date for PT Re-Evaluation 05/07/21    Authorization Type [employer]; recertification 03/12/20    PT Start Time 0930    PT Stop Time 1015    PT Time Calculation (min) 45 min    Activity Tolerance Patient tolerated treatment well    Behavior During Therapy [facility] for tasks assessed/performed             Past Medical History:  Diagnosis Date   H/O fracture 2020   left foot, right ankle   Past Surgical History:  Procedure Laterality Date   GALLBLADDER SURGERY  1996   right ankle fracture surgery  2020   2 plates/10 screws    right ankle tendon repair  2010   TONSILLECTOMY  1996   TOTAL VAGINAL HYSTERECTOMY  2009   Patient Active Problem List   Diagnosis Date Noted   Myofascial pain syndrome, cervical 02/12/2021   Dizziness 04/07/2020   Cervicalgia 04/07/2020    REFERRING DIAG: M12.571 (ICD-10-CM) - Traumatic arthropathy, right ankle and foot   THERAPY DIAG:  Unsteadiness on feet  Difficulty in walking, not elsewhere classified  Pain in left ankle and joints of left foot  Pain in right ankle and joints of right foot  History of ankle surgery  PERTINENT HISTORY: Previous ankle surgery, hx of vertigo  ONSET DATE: 12/15/20  PRECAUTIONS: none  SUBJECTIVE: ankle is feeling better today and didn't have to take as much medicine for pain. Shoulder massage helped a little with her pain in neck and dizziness.  PAIN:  Are you having pain? Yes VAS scale: 2/10 Pain location: ankle, back Pain orientation: Right, Medial, Lateral, and Anterior  PAIN TYPE: aching, burning, sharp, throbbing, and tight Pain description: constant and sharp  Aggravating factors: WB,  movement Relieving factors: rest   LE AROM/PROM:   A/PROM Right 03/12/21 Left 03/12/21 Right 04/11/21   Ankle dorsiflexion 0 15 10   Ankle plantarflexion 25 55 25*   Ankle inversion 2 45 4   Ankle eversion 6 30 8     (Blank rows = not tested)   LE MMT:   MMT Right 03/12/21 Left 03/12/21  Ankle dorsiflexion 5 5  Ankle plantarflexion 2+ 5  Ankle inversion 1+ 5  Ankle eversion 1+ 55   (Blank rows = not tested)   Functional testing:01/15/21 - 10 meter walk test: 0.39 m/s with st. cane - 5x sit to stand: 15.35 sec without HHA - Modified CTSIB: 120/120 seconds - SLS: R LE: 0 sec; L LE: 11 sec     TODAY'S TREATMENT:  04/11/21: Sci Fit: level 10 for 5' Wobble board: standing AP and Ml tilts: Unilateral R only,  20x each with PT blocking accessory motions from knee and hip to isolate ankle:  Tandem stance: DBS beam: 2 x 30" R and L- Eyes closed Standing on medial lateral tilt board: wide BOS: EC: 2 x 1' Negative heel raises: 2" depth: 2 x 10bil Fwd step down and retro step ups: 4" box: 2 x 5 R only bil HHA  Bil leg press: 90lbs 2 x 10 Uni leg press: 60lb2 x 10 Soft tissue massage to anterior, medial and lateral ankle, peroneal tendons, plantar  fascia, intertarsal muscles Passive stretching of foot into inversion, eversion, and plantarflexion 04/09/21: Sci Fit: level 10 for 5' Wobble board: standing AP and Ml tilts: Unilateral R only,  20x each with PT blocking accessory motions from knee and hip to isolate ankle:  Tandem stance: DBS beam: 2 x 30" R and L Negative heel raises: 2" depth: 2 x 10bil Bil leg press: 80lbs 2 x 10 Uni leg press: 50lb2 x 10 Soft tissue massage to anterior, medial and lateral ankle, peroneal tendons, plantar fascia, intertarsal muscles Passive stretching of foot into inversion, eversion, and plantarflexion 03/28/21: Soft tissue massage to anterior, medial and lateral ankle, peroneal tendons, plantar fascia, intertarsal muscles Passive stretching of foot into  inversion, eversion, and plantarflexion Soft tissue massage to R lumbar spinalis and multifidus and R gluts L sidelying, R knee to chest: 2 x 10 Sidelying ankle inversion and eversion: 3 x 10 R only Bil leg press: 80lbs 2 x 10 Uni leg press: 50lb2 x 10 03/26/21: Soft tissue massage to anterior, medial and lateral ankle, peroneal tendons, plantar fascia, intertarsal muscles Passive stretching of foot into inversion, eversion, and plantarflexion Toe spreading: 50x Toe curs and extensions: 50x Sidelying ankle inversion and eversion: 3 x 10 R only       PATIENT EDUCATION:  Education details: Educated on pain management with ice and exercises Person educated: Patient Education method: Explanation Education comprehension: verbalized understanding     HOME EXERCISE PROGRAM: Access Code [email or web address] 03/21/21: Pt asked to do toe spreading and toe curling exercises   ASSESSMENT:   CLINICAL IMPRESSION: Improved ankle dorsiflexion, inversion and eversion compared to evaluation   REHAB POTENTIAL: Good   CLINICAL DECISION MAKING: Stable/uncomplicated   EVALUATION COMPLEXITY: Low     GOALS: Goals reviewed with patient? Yes   SHORT TERM GOALS:   STG Name Target Date Goal status  1 Pt will be able to resume driving with <6/83 pain in R ankle Baseline: has not resumed driving (eval) 06/07/9620  On going  2 Pt will demo 5 deg of AROM of R ankle dorsiflexion to improve gait Baseline: 0 deg (eval) 04/09/2021 On going  3 Pt will demo 10 deg of ankle inversion in R ankle to improve ability to pivot on R LE Baseline:2 deg (eval) 04/09/2021 On going  4 Pt will be able to perform 5x sit to stand <12 sec without HHA to improve functional strength of LE Baseline: 15 sec (eval) 04/09/2021 On going    LONG TERM GOALS:    LTG Name Target Date Goal status  1 Pt will demo gait speed without AD that is >1 m/s to improve community ambulation Baseline:0.53m/s with st. Cane (Eval) 05/07/2021  INITIAL  2 Pt  will be able to perform 10 unilateral heel raises to improve strength of plantarflexors in R LE Baseline: pt unable to perform standing unilateral heel raise (eva) 05/07/2021 INITIAL  3 Pt will be able to ambulate 400 feet on grass without AD and without pain in ankle to improve community access Baseline: not attempted (eval) 05/07/2021 INITIAL    PLAN: PT FREQUENCY: 2x/week   PT DURATION: 8 weeks   PLANNED INTERVENTIONS: Therapeutic exercises, Therapeutic activity, Neuro Muscular re-education, Balance training, Gait training, Patient/Family education, Joint mobilization, Stair training, Dry Needling, Cryotherapy, Moist heat, scar mobilization, Taping, and Manual therapy   PLAN FOR NEXT SESSION: Bil heel raises, manual mobilization to R ankle, review HEP      Ileana Ladd, PT 04/11/2021, 9:50 AM

## 2021-04-16 ENCOUNTER — Ambulatory Visit: Payer: 59

## 2021-04-16 ENCOUNTER — Other Ambulatory Visit: Payer: Self-pay

## 2021-04-16 DIAGNOSIS — R2681 Unsteadiness on feet: Secondary | ICD-10-CM | POA: Diagnosis not present

## 2021-04-16 DIAGNOSIS — Z9889 Other specified postprocedural states: Secondary | ICD-10-CM

## 2021-04-16 DIAGNOSIS — M25572 Pain in left ankle and joints of left foot: Secondary | ICD-10-CM

## 2021-04-16 DIAGNOSIS — M25571 Pain in right ankle and joints of right foot: Secondary | ICD-10-CM

## 2021-04-16 DIAGNOSIS — R262 Difficulty in walking, not elsewhere classified: Secondary | ICD-10-CM

## 2021-04-16 NOTE — Therapy (Signed)
OUTPATIENT PHYSICAL THERAPY  Note   Patient Name: Veronica Hunter MRN: [id number] DOB:07-04-1970, 51 y.o., female Today's Date: 04/16/2021  PCP: Mattie Marlin, DO REFERRING PROVIDER: Jacinta Shoe, PA-C   PT End of Session - 04/16/21 0955     Visit Number 9    Number of Visits 17    Date for PT Re-Evaluation 05/07/21    Authorization Type U[employer]; recertification 03/12/20    PT Start Time 0935    PT Stop Time 1015    PT Time Calculation (min) 40 min    Activity Tolerance Patient tolerated treatment well    Behavior During Therapy [facility] for tasks assessed/performed             Past Medical History:  Diagnosis Date   H/O fracture 2020   left foot, right ankle   Past Surgical History:  Procedure Laterality Date   GALLBLADDER SURGERY  1996   right ankle fracture surgery  2020   2 plates/10 screws    right ankle tendon repair  2010   TONSILLECTOMY  1996   TOTAL VAGINAL HYSTERECTOMY  2009   Patient Active Problem List   Diagnosis Date Noted   Myofascial pain syndrome, cervical 02/12/2021   Dizziness 04/07/2020   Cervicalgia 04/07/2020    REFERRING DIAG: M12.571 (ICD-10-CM) - Traumatic arthropathy, right ankle and foot   THERAPY DIAG:  Unsteadiness on feet  Difficulty in walking, not elsewhere classified  Pain in left ankle and joints of left foot  Pain in right ankle and joints of right foot  History of ankle surgery  PERTINENT HISTORY: Previous ankle surgery, hx of vertigo  ONSET DATE: 12/15/20  PRECAUTIONS: none  SUBJECTIVE: ankle is feeling about the same.  PAIN:  Are you having pain? Yes VAS scale: 2/10 Pain location: ankle, back Pain orientation: Right, Medial, Lateral, and Anterior  PAIN TYPE: aching, burning, sharp, throbbing, and tight Pain description: constant and sharp  Aggravating factors: WB, movement Relieving factors: rest   LE AROM/PROM:   A/PROM Right 03/12/21 Left 03/12/21 Right 04/11/21   Ankle dorsiflexion 0 15 10   Ankle  plantarflexion 25 55 25*   Ankle inversion 2 45 4   Ankle eversion 6 30 8     (Blank rows = not tested)   LE MMT:   MMT Right 03/12/21 Left 03/12/21  Ankle dorsiflexion 5 5  Ankle plantarflexion 2+ 5  Ankle inversion 1+ 5  Ankle eversion 1+ 55   (Blank rows = not tested)   Functional testing:01/15/21 - 10 meter walk test: 0.39 m/s with st. cane - 5x sit to stand: 15.35 sec without HHA - Modified CTSIB: 120/120 seconds - SLS: R LE: 0 sec; L LE: 11 sec     TODAY'S TREATMENT:  04/16/21: Standing gastroc stretch: 2 x 1' Wobble board: AP taps: level 1, standing on it with bil LE, 20x, unable to fully dorsiflex due to R ankle restrictions yet Wobble board: ML taps: weight on L LE on floor and R LE on wobble board: partial weight only for R LE: 20x   Tandem stance: 1/2 foam roll: 2 x 30" R and L- Eyes open  Negative heel raises: 2" depth: 2 x 10bil Fwd step down and retro step ups: 4" box: 2 x 5 R only bil HHA  Bil leg press: 90lbs 2 x 10 Uni leg press: 60lb2 x 10 Soft tissue massage to anterior, medial and lateral ankle, peroneal tendons, plantar fascia, intertarsal muscles Passive stretching of foot into inversion,  eversion, and plantarflexion 04/09/21: 04/11/21: Sci Fit: level 10 for 5' Wobble board: standing AP and Ml tilts: Unilateral R only,  20x each with PT blocking accessory motions from knee and hip to isolate ankle:  Tandem stance: DBS beam: 2 x 30" R and L- Eyes closed Standing on medial lateral tilt board: wide BOS: EC: 2 x 1' Negative heel raises: 2" depth: 2 x 10bil Fwd step down and retro step ups: 4" box: 2 x 5 R only bil HHA  Bil leg press: 90lbs 2 x 10 Uni leg press: 60lb2 x 10 Soft tissue massage to anterior, medial and lateral ankle, peroneal tendons, plantar fascia, intertarsal muscles Passive stretching of foot into inversion, eversion, and plantarflexion 04/09/21: Sci Fit: level 10 for 5' Wobble board: standing AP and Ml tilts: Unilateral R only,  20x each with  PT blocking accessory motions from knee and hip to isolate ankle:  Tandem stance: DBS beam: 2 x 30" R and L Negative heel raises: 2" depth: 2 x 10bil Bil leg press: 80lbs 2 x 10 Uni leg press: 50lb2 x 10 Soft tissue massage to anterior, medial and lateral ankle, peroneal tendons, plantar fascia, intertarsal muscles Passive stretching of foot into inversion, eversion, and plantarflexion 03/28/21: Soft tissue massage to anterior, medial and lateral ankle, peroneal tendons, plantar fascia, intertarsal muscles Passive stretching of foot into inversion, eversion, and plantarflexion Soft tissue massage to R lumbar spinalis and multifidus and R gluts L sidelying, R knee to chest: 2 x 10 Sidelying ankle inversion and eversion: 3 x 10 R only Bil leg press: 80lbs 2 x 10 Uni leg press: 50lb2 x 10 03/26/21: Soft tissue massage to anterior, medial and lateral ankle, peroneal tendons, plantar fascia, intertarsal muscles Passive stretching of foot into inversion, eversion, and plantarflexion Toe spreading: 50x Toe curs and extensions: 50x Sidelying ankle inversion and eversion: 3 x 10 R only       PATIENT EDUCATION:  Education details: Educated on pain management with ice and exercises Person educated: Patient Education method: Explanation Education comprehension: verbalized understanding     HOME EXERCISE PROGRAM: Access Code [email or web address] 03/21/21: Pt asked to do toe spreading and toe curling exercises   ASSESSMENT:   CLINICAL IMPRESSION: Improved ankle inversion and eversion with standing tilt board exercsies noted. Patient able to tolerate progression well.    REHAB POTENTIAL: Good   CLINICAL DECISION MAKING: Stable/uncomplicated   EVALUATION COMPLEXITY: Low     GOALS: Goals reviewed with patient? Yes   SHORT TERM GOALS:   STG Name Target Date Goal status  1 Pt will be able to resume driving with <9/02 pain in R ankle Baseline: has not resumed driving (eval) 03/09/1550  On going   2 Pt will demo 5 deg of AROM of R ankle dorsiflexion to improve gait Baseline: 0 deg (eval) 04/09/2021 On going  3 Pt will demo 10 deg of ankle inversion in R ankle to improve ability to pivot on R LE Baseline:2 deg (eval) 04/09/2021 On going  4 Pt will be able to perform 5x sit to stand <12 sec without HHA to improve functional strength of LE Baseline: 15 sec (eval) 04/09/2021 On going    LONG TERM GOALS:    LTG Name Target Date Goal status  1 Pt will demo gait speed without AD that is >1 m/s to improve community ambulation Baseline:0.81m/s with st. Cane (Eval) 05/07/2021  INITIAL  2 Pt will be able to perform 10 unilateral heel raises to improve strength of plantarflexors  in R LE Baseline: pt unable to perform standing unilateral heel raise (eva) 05/07/2021 INITIAL  3 Pt will be able to ambulate 400 feet on grass without AD and without pain in ankle to improve community access Baseline: not attempted (eval) 05/07/2021 INITIAL    PLAN: PT FREQUENCY: 2x/week   PT DURATION: 8 weeks   PLANNED INTERVENTIONS: Therapeutic exercises, Therapeutic activity, Neuro Muscular re-education, Balance training, Gait training, Patient/Family education, Joint mobilization, Stair training, Dry Needling, Cryotherapy, Moist heat, scar mobilization, Taping, and Manual therapy   PLAN FOR NEXT SESSION: Bil heel raises, manual mobilization to R ankle, review HEP      Ileana Ladd, PT 04/16/2021, 10:18 AM

## 2021-04-18 ENCOUNTER — Ambulatory Visit: Payer: 59

## 2021-04-23 ENCOUNTER — Ambulatory Visit: Payer: 59

## 2021-04-25 ENCOUNTER — Ambulatory Visit: Payer: 59

## 2021-04-25 ENCOUNTER — Other Ambulatory Visit: Payer: Self-pay

## 2021-04-25 DIAGNOSIS — M25571 Pain in right ankle and joints of right foot: Secondary | ICD-10-CM

## 2021-04-25 DIAGNOSIS — R2681 Unsteadiness on feet: Secondary | ICD-10-CM

## 2021-04-25 DIAGNOSIS — Z9889 Other specified postprocedural states: Secondary | ICD-10-CM

## 2021-04-25 DIAGNOSIS — R262 Difficulty in walking, not elsewhere classified: Secondary | ICD-10-CM

## 2021-04-25 DIAGNOSIS — M25572 Pain in left ankle and joints of left foot: Secondary | ICD-10-CM

## 2021-04-25 NOTE — Therapy (Signed)
OUTPATIENT PHYSICAL THERAPY  Note   Patient Name: Veronica Hunter MRN: [id number] DOB:[birth year], 51 y.o., female Today's Date: 04/25/2021  PCP: Mattie Marlin, DO REFERRING PROVIDER: Jacinta Shoe, PA-C   PT End of Session - 04/25/21 [phone]     Visit Number 10    Number of Visits 17    Date for PT Re-Evaluation 05/07/21    Authorization Type U[employer]; recertification 03/12/20    PT Start Time 0930    PT Stop Time 1015    PT Time Calculation (min) 45 min    Activity Tolerance Patient tolerated treatment well    Behavior During Therapy [facility] for tasks assessed/performed             Past Medical History:  Diagnosis Date   H/O fracture 2020   left foot, right ankle   Past Surgical History:  Procedure Laterality Date   GALLBLADDER SURGERY  1996   right ankle fracture surgery  2020   2 plates/10 screws    right ankle tendon repair  2010   TONSILLECTOMY  1996   TOTAL VAGINAL HYSTERECTOMY  2009   Patient Active Problem List   Diagnosis Date Noted   Myofascial pain syndrome, cervical 02/12/2021   Dizziness 04/07/2020   Cervicalgia 04/07/2020    REFERRING DIAG: M12.571 (ICD-10-CM) - Traumatic arthropathy, right ankle and foot   THERAPY DIAG:  Unsteadiness on feet  Difficulty in walking, not elsewhere classified  Pain in left ankle and joints of left foot  Pain in right ankle and joints of right foot  History of ankle surgery  PERTINENT HISTORY: Previous ankle surgery, hx of vertigo  ONSET DATE: 12/15/20  PRECAUTIONS: none  SUBJECTIVE: Pt reports they are moving so ankle is feeling very stiff. Also she hadn't been to PT for 1.5 week and she thinks it was too long of wait due to her moving into new house.  PAIN:  Are you having pain? Yes VAS scale: 4/10 Pain location: ankle, back Pain orientation: Right, Medial, Lateral, and Anterior  PAIN TYPE: aching, burning, sharp, throbbing, and tight Pain description: constant and sharp  Aggravating factors: WB,  movement Relieving factors: rest   LE AROM/PROM:   A/PROM Right 03/12/21 Left 03/12/21 Right 04/11/21   Ankle dorsiflexion 0 15 10   Ankle plantarflexion 25 55 25*   Ankle inversion 2 45 4   Ankle eversion 6 30 8     (Blank rows = not tested)   LE MMT:   MMT Right 03/12/21 Left 03/12/21  Ankle dorsiflexion 5 5  Ankle plantarflexion 2+ 5  Ankle inversion 1+ 5  Ankle eversion 1+ 55   (Blank rows = not tested)   Functional testing:01/15/21 - 10 meter walk test: 0.39 m/s with st. cane - 5x sit to stand: 15.35 sec without HHA - Modified CTSIB: 120/120 seconds - SLS: R LE: 0 sec; L LE: 11 sec     TODAY'S TREATMENT:  04/25/21:  Manual soft tissue mobilization: to anterior ankle, lateral ankle, peroneals, anterior tibialis Gastroc stretch: 3 x 30" R only Standing heel raises: 2 x 10 Gait training: 1 x 20 feet, focusing on toe push offs with mid to late stance phase on trailing leg. Pt unable to perform it, tried it with walking sticks, unable to perform it. Tried in standing with R leg behind and rocking froward with toe push off on R leg: 20x Pt educated on performing above exercises daily.  04/16/21: Standing gastroc stretch: 2 x 1' Wobble board: AP taps:  level 1, standing on it with bil LE, 20x, unable to fully dorsiflex due to R ankle restrictions yet Wobble board: ML taps: weight on L LE on floor and R LE on wobble board: partial weight only for R LE: 20x   Tandem stance: 1/2 foam roll: 2 x 30" R and L- Eyes open  Negative heel raises: 2" depth: 2 x 10bil Fwd step down and retro step ups: 4" box: 2 x 5 R only bil HHA  Bil leg press: 90lbs 2 x 10 Uni leg press: 60lb2 x 10 Soft tissue massage to anterior, medial and lateral ankle, peroneal tendons, plantar fascia, intertarsal muscles Passive stretching of foot into inversion, eversion, and plantarflexion 04/09/21: 04/11/21: Sci Fit: level 10 for 5' Wobble board: standing AP and Ml tilts: Unilateral R only,  20x each with PT  blocking accessory motions from knee and hip to isolate ankle:  Tandem stance: DBS beam: 2 x 30" R and L- Eyes closed Standing on medial lateral tilt board: wide BOS: EC: 2 x 1' Negative heel raises: 2" depth: 2 x 10bil Fwd step down and retro step ups: 4" box: 2 x 5 R only bil HHA  Bil leg press: 90lbs 2 x 10 Uni leg press: 60lb2 x 10 Soft tissue massage to anterior, medial and lateral ankle, peroneal tendons, plantar fascia, intertarsal muscles Passive stretching of foot into inversion, eversion, and plantarflexion 04/09/21: Sci Fit: level 10 for 5' Wobble board: standing AP and Ml tilts: Unilateral R only,  20x each with PT blocking accessory motions from knee and hip to isolate ankle:  Tandem stance: DBS beam: 2 x 30" R and L Negative heel raises: 2" depth: 2 x 10bil Bil leg press: 80lbs 2 x 10 Uni leg press: 50lb2 x 10 Soft tissue massage to anterior, medial and lateral ankle, peroneal tendons, plantar fascia, intertarsal muscles Passive stretching of foot into inversion, eversion, and plantarflexion 03/28/21: Soft tissue massage to anterior, medial and lateral ankle, peroneal tendons, plantar fascia, intertarsal muscles Passive stretching of foot into inversion, eversion, and plantarflexion Soft tissue massage to R lumbar spinalis and multifidus and R gluts L sidelying, R knee to chest: 2 x 10 Sidelying ankle inversion and eversion: 3 x 10 R only Bil leg press: 80lbs 2 x 10 Uni leg press: 50lb2 x 10 03/26/21: Soft tissue massage to anterior, medial and lateral ankle, peroneal tendons, plantar fascia, intertarsal muscles Passive stretching of foot into inversion, eversion, and plantarflexion Toe spreading: 50x Toe curs and extensions: 50x Sidelying ankle inversion and eversion: 3 x 10 R only       PATIENT EDUCATION:  Education details: Educated on pain management with ice and exercises Person educated: Patient Education method: Explanation Education comprehension: verbalized  understanding     HOME EXERCISE PROGRAM: Access Code [email or web address] 03/21/21: Pt asked to do toe spreading and toe curling exercises   ASSESSMENT:   CLINICAL IMPRESSION: Pt demonstrated decreased toe push off on R leg during mid to late stance phase which affects activation of ankle plantarflexors with functional gait. Patient will be dropped down to 1x/week for next 5 weeks to continue to work on finalizing HEP to work on independent home exercise management.   REHAB POTENTIAL: Good   CLINICAL DECISION MAKING: Stable/uncomplicated   EVALUATION COMPLEXITY: Low     GOALS: Goals reviewed with patient? Yes   SHORT TERM GOALS:   STG Name Target Date Goal status  1 Pt will be able to resume driving with <3/14 pain in  R ankle Baseline: has not resumed driving (eval) 0/03/270  On going  2 Pt will demo 5 deg of AROM of R ankle dorsiflexion to improve gait Baseline: 0 deg (eval) 04/09/2021 On going  3 Pt will demo 10 deg of ankle inversion in R ankle to improve ability to pivot on R LE Baseline:2 deg (eval) 04/09/2021 On going  4 Pt will be able to perform 5x sit to stand <12 sec without HHA to improve functional strength of LE Baseline: 15 sec (eval) 04/09/2021 On going    LONG TERM GOALS:    LTG Name Target Date Goal status  1 Pt will demo gait speed without AD that is >1 m/s to improve community ambulation Baseline:0.73m/s with st. Cane (Eval) 05/07/2021  INITIAL  2 Pt will be able to perform 10 unilateral heel raises to improve strength of plantarflexors in R LE Baseline: pt unable to perform standing unilateral heel raise (eva) 05/07/2021 INITIAL  3 Pt will be able to ambulate 400 feet on grass without AD and without pain in ankle to improve community access Baseline: not attempted (eval) 05/07/2021 INITIAL    PLAN: PT FREQUENCY: 2x/week   PT DURATION: 8 weeks   PLANNED INTERVENTIONS: Therapeutic exercises, Therapeutic activity, Neuro Muscular re-education, Balance training, Gait  training, Patient/Family education, Joint mobilization, Stair training, Dry Needling, Cryotherapy, Moist heat, scar mobilization, Taping, and Manual therapy   PLAN FOR NEXT SESSION: Recert for 5 sessiosn next session      Ileana Ladd, PT 04/25/2021, 10:30 AM

## 2021-04-30 ENCOUNTER — Ambulatory Visit: Payer: 59

## 2021-05-02 ENCOUNTER — Ambulatory Visit: Payer: 59

## 2021-05-02 ENCOUNTER — Other Ambulatory Visit: Payer: Self-pay

## 2021-05-02 DIAGNOSIS — R262 Difficulty in walking, not elsewhere classified: Secondary | ICD-10-CM

## 2021-05-02 DIAGNOSIS — M25572 Pain in left ankle and joints of left foot: Secondary | ICD-10-CM

## 2021-05-02 DIAGNOSIS — M25571 Pain in right ankle and joints of right foot: Secondary | ICD-10-CM

## 2021-05-02 DIAGNOSIS — R2681 Unsteadiness on feet: Secondary | ICD-10-CM

## 2021-05-02 DIAGNOSIS — Z9889 Other specified postprocedural states: Secondary | ICD-10-CM

## 2021-05-02 NOTE — Therapy (Addendum)
OUTPATIENT PHYSICAL THERAPY  Note   Patient Name: Veronica Hunter MRN: [id number] DOB:08-Sep-1970, 51 y.o., female Today's Date: 05/02/2021  PCP: Mattie Marlin, DO REFERRING PROVIDER: Jacinta Shoe, PA-C   PT End of Session - 05/02/21 1007     Visit Number 11    Number of Visits 17    Date for PT Re-Evaluation 05/07/21    Authorization Type U[employer]; recertification 03/12/20    PT Start Time 0930    PT Stop Time 1015    PT Time Calculation (min) 45 min    Activity Tolerance Patient tolerated treatment well    Behavior During Therapy [facility] for tasks assessed/performed             Past Medical History:  Diagnosis Date   H/O fracture 2020   left foot, right ankle   Past Surgical History:  Procedure Laterality Date   GALLBLADDER SURGERY  1996   right ankle fracture surgery  2020   2 plates/10 screws    right ankle tendon repair  2010   TONSILLECTOMY  1996   TOTAL VAGINAL HYSTERECTOMY  2009   Patient Active Problem List   Diagnosis Date Noted   Myofascial pain syndrome, cervical 02/12/2021   Dizziness 04/07/2020   Cervicalgia 04/07/2020    REFERRING DIAG: M12.571 (ICD-10-CM) - Traumatic arthropathy, right ankle and foot   THERAPY DIAG:  Unsteadiness on feet  Difficulty in walking, not elsewhere classified  Pain in left ankle and joints of left foot  Pain in right ankle and joints of right foot  History of ankle surgery  PERTINENT HISTORY: Previous ankle surgery, hx of vertigo  ONSET DATE: 12/15/20  PRECAUTIONS: none  SUBJECTIVE: I started therapy for my neck and shoulder at different place recommended by my MD. They are doing dry needling and massage. I have had one session so far. Foot pain/ankle pain is about the same.  PAIN:  Are you having pain? Yes VAS scale: 4/10 Pain location: ankle, back Pain orientation: Right, Medial, Lateral, and Anterior  PAIN TYPE: aching, burning, sharp, throbbing, and tight Pain description: constant and sharp   Aggravating factors: WB, movement Relieving factors: rest   LE AROM/PROM:   A/PROM Right 03/12/21 Left 03/12/21 Right 04/11/21   Ankle dorsiflexion 0 15 10   Ankle plantarflexion 25 55 25*   Ankle inversion 2 45 4   Ankle eversion 6 30 8     (Blank rows = not tested)   LE MMT:   MMT Right 03/12/21 Left 03/12/21  Ankle dorsiflexion 5 5  Ankle plantarflexion 2+ 5  Ankle inversion 1+ 5  Ankle eversion 1+ 55   (Blank rows = not tested)   Functional testing:01/15/21 - 10 meter walk test: 0.39 m/s with st. cane - 5x sit to stand: 15.35 sec without HHA - Modified CTSIB: 120/120 seconds - SLS: R LE: 0 sec; L LE: 11 sec     TODAY'S TREATMENT:  05/02/21: Manual soft tissue mobilization: to anterior ankle, lateral ankle, peroneals, anterior tibialis Passive stretching of foot into plantarflexion and inversion Grade III inversion mobilization and mid foot and forefoot mobilization Standing with partial tandem stance, practing pushing off with rear  leg: 2 x 20 R and L- pt educated on doing this 2-3x/day Bil leg press: 100lbs 2 x 10 Uni leg pres: 60lbs 2 x 10 R and L  Pt educated on trial of off the shelf orthotic to provide mild arch support and stabilize mid and rear foot and reduce pain in big  toe. Pt given resournces to trial. Pt educated on gradual wearing schedule with orthotics. 04/25/21:  Manual soft tissue mobilization: to anterior ankle, lateral ankle, peroneals, anterior tibialis Gastroc stretch: 3 x 30" R only Standing heel raises: 2 x 10 Gait training: 1 x 20 feet, focusing on toe push offs with mid to late stance phase on trailing leg. Pt unable to perform it, tried it with walking sticks, unable to perform it. Tried in standing with R leg behind and rocking froward with toe push off on R leg: 20x Pt educated on performing above exercises daily.  04/16/21: Standing gastroc stretch: 2 x 1' Wobble board: AP taps: level 1, standing on it with bil LE, 20x, unable to fully  dorsiflex due to R ankle restrictions yet Wobble board: ML taps: weight on L LE on floor and R LE on wobble board: partial weight only for R LE: 20x   Tandem stance: 1/2 foam roll: 2 x 30" R and L- Eyes open  Negative heel raises: 2" depth: 2 x 10bil Fwd step down and retro step ups: 4" box: 2 x 5 R only bil HHA  Bil leg press: 90lbs 2 x 10 Uni leg press: 60lb2 x 10 Soft tissue massage to anterior, medial and lateral ankle, peroneal tendons, plantar fascia, intertarsal muscles Passive stretching of foot into inversion, eversion, and plantarflexion 04/09/21:   PATIENT EDUCATION:  Education details: Educated on pain management with ice and exercises Person educated: Patient Education method: Explanation Education comprehension: verbalized understanding     HOME EXERCISE PROGRAM: Access Code [email or web address] 03/21/21: Pt asked to do toe spreading and toe curling exercises   ASSESSMENT:   CLINICAL IMPRESSION: Inversion is still very restricted with majority of pain in anterior and lateral ankle. Pt was advised to try superfeet green in the store for mild arch support to off load big toe with standing and walking and reduce pain.   REHAB POTENTIAL: Good   CLINICAL DECISION MAKING: Stable/uncomplicated   EVALUATION COMPLEXITY: Low     GOALS: Goals reviewed with patient? Yes   SHORT TERM GOALS:   STG Name Target Date Goal status  1 Pt will be able to resume driving with <9/92 pain in R ankle Baseline: has not resumed driving (eval) 06/08/6832  On going  2 Pt will demo 5 deg of AROM of R ankle dorsiflexion to improve gait Baseline: 0 deg (eval) 04/09/2021 On going  3 Pt will demo 10 deg of ankle inversion in R ankle to improve ability to pivot on R LE Baseline:2 deg (eval) 04/09/2021 On going  4 Pt will be able to perform 5x sit to stand <12 sec without HHA to improve functional strength of LE Baseline: 15 sec (eval) 04/09/2021 On going    LONG TERM GOALS:    LTG Name Target Date Goal  status  1 Pt will demo gait speed without AD that is >1 m/s to improve community ambulation Baseline:0.22m/s with st. Cane (Eval) 05/07/2021  INITIAL  2 Pt will be able to perform 10 unilateral heel raises to improve strength of plantarflexors in R LE Baseline: pt unable to perform standing unilateral heel raise (eva) 05/07/2021 INITIAL  3 Pt will be able to ambulate 400 feet on grass without AD and without pain in ankle to improve community access Baseline: not attempted (eval) 05/07/2021 INITIAL    PLAN: PT FREQUENCY: 2x/week   PT DURATION: 8 weeks   PLANNED INTERVENTIONS: Therapeutic exercises, Therapeutic activity, Neuro Muscular re-education, Balance training, Gait training,  Patient/Family education, Joint mobilization, Stair training, Dry Needling, Cryotherapy, Moist heat, scar mobilization, Taping, and Manual therapy   PLAN FOR NEXT SESSION: Recert for 5 sessiosn next session      Ileana Ladd, PT 05/02/2021, 10:09 AM

## 2021-05-09 ENCOUNTER — Ambulatory Visit: Payer: 59 | Attending: Family Medicine

## 2021-05-09 ENCOUNTER — Other Ambulatory Visit: Payer: Self-pay

## 2021-05-09 DIAGNOSIS — R2681 Unsteadiness on feet: Secondary | ICD-10-CM | POA: Diagnosis present

## 2021-05-09 DIAGNOSIS — Z9889 Other specified postprocedural states: Secondary | ICD-10-CM | POA: Insufficient documentation

## 2021-05-09 DIAGNOSIS — R262 Difficulty in walking, not elsewhere classified: Secondary | ICD-10-CM | POA: Insufficient documentation

## 2021-05-09 DIAGNOSIS — M25572 Pain in left ankle and joints of left foot: Secondary | ICD-10-CM | POA: Diagnosis present

## 2021-05-09 DIAGNOSIS — M25571 Pain in right ankle and joints of right foot: Secondary | ICD-10-CM | POA: Diagnosis present

## 2021-05-09 NOTE — Therapy (Signed)
OUTPATIENT PHYSICAL THERAPY  Note   Patient Name: Veronica Hunter MRN: [id number] DOB:08/01/1970, 51 y.o., female Today's Date: 05/09/2021  PCP: Mattie Marlin, DO REFERRING PROVIDER: Jacinta Shoe, PA-C   PT End of Session - 05/09/21 1051     Visit Number 12    Number of Visits 17    Date for PT Re-Evaluation 05/07/21    Authorization Type U[employer]; recertification 03/12/20    PT Start Time 1015    PT Stop Time 1100    PT Time Calculation (min) 45 min    Activity Tolerance Patient tolerated treatment well    Behavior During Therapy WFL for tasks assessed/performed             Past Medical History:  Diagnosis Date   H/O fracture 2020   left foot, right ankle   Past Surgical History:  Procedure Laterality Date   GALLBLADDER SURGERY  1996   right ankle fracture surgery  2020   2 plates/10 screws    right ankle tendon repair  2010   TONSILLECTOMY  1996   TOTAL VAGINAL HYSTERECTOMY  2009   Patient Active Problem List   Diagnosis Date Noted   Myofascial pain syndrome, cervical 02/12/2021   Dizziness 04/07/2020   Cervicalgia 04/07/2020    REFERRING DIAG: M12.571 (ICD-10-CM) - Traumatic arthropathy, right ankle and foot   THERAPY DIAG:  Difficulty in walking, not elsewhere classified  Unsteadiness on feet  Pain in left ankle and joints of left foot  Pain in right ankle and joints of right foot  History of ankle surgery  PERTINENT HISTORY: Previous ankle surgery, hx of vertigo  ONSET DATE: 12/15/20  PRECAUTIONS: none  SUBJECTIVE: Pt reports since this morning her left ankle has been bothering her. R ankle is stable.  PAIN:  Are you having pain? Yes VAS scale: 4/10 Pain location: ankle, back Pain orientation: Right, Medial, Lateral, and Anterior  PAIN TYPE: aching, burning, sharp, throbbing, and tight Pain description: constant and sharp  Aggravating factors: WB, movement Relieving factors: rest   LE AROM/PROM:   A/PROM Right 03/12/21 Left 03/12/21  Right 04/11/21   Ankle dorsiflexion 0 15 10   Ankle plantarflexion 25 55 25*   Ankle inversion 2 45 4   Ankle eversion 6 30 8     (Blank rows = not tested)   LE MMT:   MMT Right 03/12/21 Left 03/12/21  Ankle dorsiflexion 5 5  Ankle plantarflexion 2+ 5  Ankle inversion 1+ 5  Ankle eversion 1+ 55   (Blank rows = not tested)   Functional testing:01/15/21 - 10 meter walk test: 0.39 m/s with st. cane - 5x sit to stand: 15.35 sec without HHA - Modified CTSIB: 120/120 seconds - SLS: R LE: 0 sec; L LE: 11 sec     TODAY'S TREATMENT: 05/09/21: Manual soft tissue mobilization: to anterior ankle, lateral ankle, peroneals, anterior tibialis Passive stretching of foot into plantarflexion and inversion Grade III inversion mobilization and mid foot and forefoot mobilization Partial tandem push off with back leg with PT manually resisting at chest for core activation and manual resistance: 3 x 10 R and L Crossed lateral step up with outside leg first: 10x R and L, 6" step Heel walk and toe walk: 2 x 20 feet with min A for balance Pt asked to "Limp" on her good side by pretending she can't bear much weight through L LE for 2 x 20 feet: this is to incresed WB through involved R LE and improve lateral  lean to R side and having patient experience how she has been walking to favor her involved side but now improving its involvement.  05/02/21: Manual soft tissue mobilization: to anterior ankle, lateral ankle, peroneals, anterior tibialis Passive stretching of foot into plantarflexion and inversion Grade III inversion mobilization and mid foot and forefoot mobilization Standing with partial tandem stance, practing pushing off with rear  leg: 2 x 20 R and L- pt educated on doing this 2-3x/day Bil leg press: 100lbs 2 x 10 Uni leg pres: 60lbs 2 x 10 R and L  Pt educated on trial of off the shelf orthotic to provide mild arch support and stabilize mid and rear foot and reduce pain in big toe. Pt given  resournces to trial. Pt educated on gradual wearing schedule with orthotics. 04/25/21:  Manual soft tissue mobilization: to anterior ankle, lateral ankle, peroneals, anterior tibialis Gastroc stretch: 3 x 30" R only Standing heel raises: 2 x 10 Gait training: 1 x 20 feet, focusing on toe push offs with mid to late stance phase on trailing leg. Pt unable to perform it, tried it with walking sticks, unable to perform it. Tried in standing with R leg behind and rocking froward with toe push off on R leg: 20x Pt educated on performing above exercises daily.  04/16/21: Standing gastroc stretch: 2 x 1' Wobble board: AP taps: level 1, standing on it with bil LE, 20x, unable to fully dorsiflex due to R ankle restrictions yet Wobble board: ML taps: weight on L LE on floor and R LE on wobble board: partial weight only for R LE: 20x   Tandem stance: 1/2 foam roll: 2 x 30" R and L- Eyes open  Negative heel raises: 2" depth: 2 x 10bil Fwd step down and retro step ups: 4" box: 2 x 5 R only bil HHA  Bil leg press: 90lbs 2 x 10 Uni leg press: 60lb2 x 10 Soft tissue massage to anterior, medial and lateral ankle, peroneal tendons, plantar fascia, intertarsal muscles Passive stretching of foot into inversion, eversion, and plantarflexion 04/09/21:   PATIENT EDUCATION:  Education details: Educated on pain management with ice and exercises Person educated: Patient Education method: Explanation Education comprehension: verbalized understanding     HOME EXERCISE PROGRAM: Access Code [email or web address] 03/21/21: Pt asked to do toe spreading and toe curling exercises   ASSESSMENT:   CLINICAL IMPRESSION: Pt is still very guarded in her R ankle and her muscles stay tense with passive stretching. Notable change in her gait noted at end of the session with minimal to no limp in her gait and patient demonstrated improve weight shfit to R during R stance stance and improving push off with R foot during mid to late stance.    REHAB POTENTIAL: Good   CLINICAL DECISION MAKING: Stable/uncomplicated   EVALUATION COMPLEXITY: Low     GOALS: Goals reviewed with patient? Yes   SHORT TERM GOALS:   STG Name Target Date Goal status  1 Pt will be able to resume driving with <9/32 pain in R ankle Baseline: has not resumed driving (eval) 05/12/5730  On going  2 Pt will demo 5 deg of AROM of R ankle dorsiflexion to improve gait Baseline: 0 deg (eval) 04/09/2021 On going  3 Pt will demo 10 deg of ankle inversion in R ankle to improve ability to pivot on R LE Baseline:2 deg (eval) 04/09/2021 On going  4 Pt will be able to perform 5x sit to stand <12 sec without HHA  to improve functional strength of LE Baseline: 15 sec (eval) 04/09/2021 On going    LONG TERM GOALS:    LTG Name Target Date Goal status  1 Pt will demo gait speed without AD that is >1 m/s to improve community ambulation Baseline:0.74m/s with st. Cane (Eval) 05/07/2021  INITIAL  2 Pt will be able to perform 10 unilateral heel raises to improve strength of plantarflexors in R LE Baseline: pt unable to perform standing unilateral heel raise (eva) 05/07/2021 INITIAL  3 Pt will be able to ambulate 400 feet on grass without AD and without pain in ankle to improve community access Baseline: not attempted (eval) 05/07/2021 INITIAL    PLAN: PT FREQUENCY: 2x/week   PT DURATION: 8 weeks   PLANNED INTERVENTIONS: Therapeutic exercises, Therapeutic activity, Neuro Muscular re-education, Balance training, Gait training, Patient/Family education, Joint mobilization, Stair training, Dry Needling, Cryotherapy, Moist heat, scar mobilization, Taping, and Manual therapy   PLAN FOR NEXT SESSION: Recert for 5 sessiosn next session      Ileana Ladd, PT 05/09/2021, 10:52 AM

## 2021-05-12 ENCOUNTER — Ambulatory Visit: Payer: 59

## 2021-05-12 ENCOUNTER — Other Ambulatory Visit: Payer: Self-pay

## 2021-05-12 DIAGNOSIS — M25572 Pain in left ankle and joints of left foot: Secondary | ICD-10-CM

## 2021-05-12 DIAGNOSIS — M25571 Pain in right ankle and joints of right foot: Secondary | ICD-10-CM

## 2021-05-12 DIAGNOSIS — R262 Difficulty in walking, not elsewhere classified: Secondary | ICD-10-CM

## 2021-05-12 DIAGNOSIS — R2681 Unsteadiness on feet: Secondary | ICD-10-CM

## 2021-05-12 DIAGNOSIS — Z9889 Other specified postprocedural states: Secondary | ICD-10-CM

## 2021-05-12 NOTE — Therapy (Signed)
OUTPATIENT PHYSICAL THERAPY  Note   Patient Name: Veronica Hunter MRN: [id number] DOB:02-17-1971, 51 y.o., female Today's Date: 05/12/2021  PCP: Mattie Marlin, DO REFERRING PROVIDER: Jacinta Shoe, PA-C   PT End of Session - 05/12/21 1041     Visit Number 13    Number of Visits 17    Date for PT Re-Evaluation 05/07/21    Authorization Type [employer]; recertification 03/12/20    PT Start Time 1015    PT Stop Time 1100    PT Time Calculation (min) 45 min    Activity Tolerance Patient tolerated treatment well    Behavior During Therapy WFL for tasks assessed/performed             Past Medical History:  Diagnosis Date   H/O fracture 2020   left foot, right ankle   Past Surgical History:  Procedure Laterality Date   GALLBLADDER SURGERY  1996   right ankle fracture surgery  2020   2 plates/10 screws    right ankle tendon repair  2010   TONSILLECTOMY  1996   TOTAL VAGINAL HYSTERECTOMY  2009   Patient Active Problem List   Diagnosis Date Noted   Myofascial pain syndrome, cervical 02/12/2021   Dizziness 04/07/2020   Cervicalgia 04/07/2020    REFERRING DIAG: M12.571 (ICD-10-CM) - Traumatic arthropathy, right ankle and foot   THERAPY DIAG:  Difficulty in walking, not elsewhere classified  Pain in left ankle and joints of left foot  Pain in right ankle and joints of right foot  Unsteadiness on feet  History of ankle surgery  PERTINENT HISTORY: Previous ankle surgery, hx of vertigo  ONSET DATE: 12/15/20  PRECAUTIONS: none  SUBJECTIVE: Pt reports her ankle was hurting more this weekend. She doesn't know if it is because new exercises that she did in PT last week. PAIN:  Are you having pain? Yes VAS scale: 5-6/10 Pain location: ankle, back Pain orientation: Right, Medial, Lateral, and Anterior  PAIN TYPE: aching, burning, sharp, throbbing, and tight Pain description: constant and sharp  Aggravating factors: WB, movement Relieving factors: rest   LE  AROM/PROM:   A/PROM Right 03/12/21 Left 03/12/21 Right 04/11/21   Ankle dorsiflexion 0 15 10   Ankle plantarflexion 25 55 25*   Ankle inversion 2 45 4   Ankle eversion 6 30 8     (Blank rows = not tested)   LE MMT:   MMT Right 03/12/21 Left 03/12/21  Ankle dorsiflexion 5 5  Ankle plantarflexion 2+ 5  Ankle inversion 1+ 5  Ankle eversion 1+ 55   (Blank rows = not tested)   Functional testing:01/15/21 - 10 meter walk test: 0.39 m/s with st. cane - 5x sit to stand: 15.35 sec without HHA - Modified CTSIB: 120/120 seconds - SLS: R LE: 0 sec; L LE: 11 sec     TODAY'S TREATMENT: 05/12/21: Manual soft tissue mobilization: to anterior ankle, lateral ankle, peroneals, anterior tibialis Active soft tissue release to anteriro tib distal tendons with plantar flexion: 20x Passive stretch into ankle plantarflexion and inversion Standing toe curls: 3 x 10 R only- verbally added to HEP Bil leg press: 100 lbs 2 x 10 Uni leg press: 60 lbs 2 x 10 R and L Figure 8 walking: 2 x 15 feet R and L Toe walking and heel walking: 2 x 10 Partial tandem push off with back leg with PT manually resisting at chest for core activation and manual resistance: 2 x 10 R and L    05/09/21:  Manual soft tissue mobilization: to anterior ankle, lateral ankle, peroneals, anterior tibialis Passive stretching of foot into plantarflexion and inversion Grade III inversion mobilization and mid foot and forefoot mobilization Partial tandem push off with back leg with PT manually resisting at chest for core activation and manual resistance: 3 x 10 R and L Crossed lateral step up with outside leg first: 10x R and L, 6" step Heel walk and toe walk: 2 x 20 feet with min A for balance Pt asked to "Limp" on her good side by pretending she can't bear much weight through L LE for 2 x 20 feet: this is to incresed WB through involved R LE and improve lateral lean to R side and having patient experience how she has been walking to favor her  involved side but now improving its involvement.  05/02/21: Manual soft tissue mobilization: to anterior ankle, lateral ankle, peroneals, anterior tibialis Passive stretching of foot into plantarflexion and inversion Grade III inversion mobilization and mid foot and forefoot mobilization Standing with partial tandem stance, practing pushing off with rear  leg: 2 x 20 R and L- pt educated on doing this 2-3x/day Bil leg press: 100lbs 2 x 10 Uni leg pres: 60lbs 2 x 10 R and L  Pt educated on trial of off the shelf orthotic to provide mild arch support and stabilize mid and rear foot and reduce pain in big toe. Pt given resournces to trial. Pt educated on gradual wearing schedule with orthotics. 04/25/21:  Manual soft tissue mobilization: to anterior ankle, lateral ankle, peroneals, anterior tibialis Gastroc stretch: 3 x 30" R only Standing heel raises: 2 x 10 Gait training: 1 x 20 feet, focusing on toe push offs with mid to late stance phase on trailing leg. Pt unable to perform it, tried it with walking sticks, unable to perform it. Tried in standing with R leg behind and rocking froward with toe push off on R leg: 20x Pt educated on performing above exercises daily.  04/16/21: Standing gastroc stretch: 2 x 1' Wobble board: AP taps: level 1, standing on it with bil LE, 20x, unable to fully dorsiflex due to R ankle restrictions yet Wobble board: ML taps: weight on L LE on floor and R LE on wobble board: partial weight only for R LE: 20x   Tandem stance: 1/2 foam roll: 2 x 30" R and L- Eyes open  Negative heel raises: 2" depth: 2 x 10bil Fwd step down and retro step ups: 4" box: 2 x 5 R only bil HHA  Bil leg press: 90lbs 2 x 10 Uni leg press: 60lb2 x 10 Soft tissue massage to anterior, medial and lateral ankle, peroneal tendons, plantar fascia, intertarsal muscles Passive stretching of foot into inversion, eversion, and plantarflexion 04/09/21:   PATIENT EDUCATION:  Education details: Educated  on pain management with ice and exercises Person educated: Patient Education method: Explanation Education comprehension: verbalized understanding     HOME EXERCISE PROGRAM: Access Code [email or web address] 03/21/21: Pt asked to do toe spreading and toe curling exercises   ASSESSMENT:   CLINICAL IMPRESSION: Pt's gait is gradually improving with improved push off with R LE during gait. Pt reported ankle felt looser at end of the session. Pt unable to take larger step to R with L LE due to limited ankle ROM in R during stance phase with figure 8 walking.   REHAB POTENTIAL: Good   CLINICAL DECISION MAKING: Stable/uncomplicated   EVALUATION COMPLEXITY: Low     GOALS: Goals reviewed  with patient? Yes   SHORT TERM GOALS:   STG Name Target Date Goal status  1 Pt will be able to resume driving with <9/93 pain in R ankle Baseline: has not resumed driving (eval) 09/06/6965  On going  2 Pt will demo 5 deg of AROM of R ankle dorsiflexion to improve gait Baseline: 0 deg (eval) 04/09/2021 On going  3 Pt will demo 10 deg of ankle inversion in R ankle to improve ability to pivot on R LE Baseline:2 deg (eval) 04/09/2021 On going  4 Pt will be able to perform 5x sit to stand <12 sec without HHA to improve functional strength of LE Baseline: 15 sec (eval) 04/09/2021 On going    LONG TERM GOALS:    LTG Name Target Date Goal status  1 Pt will demo gait speed without AD that is >1 m/s to improve community ambulation Baseline:0.39m/s with st. Cane (Eval) 05/07/2021  INITIAL  2 Pt will be able to perform 10 unilateral heel raises to improve strength of plantarflexors in R LE Baseline: pt unable to perform standing unilateral heel raise (eva) 05/07/2021 INITIAL  3 Pt will be able to ambulate 400 feet on grass without AD and without pain in ankle to improve community access Baseline: not attempted (eval) 05/07/2021 INITIAL    PLAN: PT FREQUENCY: 2x/week   PT DURATION: 8 weeks   PLANNED INTERVENTIONS: Therapeutic  exercises, Therapeutic activity, Neuro Muscular re-education, Balance training, Gait training, Patient/Family education, Joint mobilization, Stair training, Dry Needling, Cryotherapy, Moist heat, scar mobilization, Taping, and Manual therapy   PLAN FOR NEXT SESSION: Recert for 5 sessiosn next session      Ileana Ladd, PT 05/12/2021, 10:48 AM

## 2021-05-14 ENCOUNTER — Ambulatory Visit: Payer: Self-pay

## 2021-05-14 ENCOUNTER — Ambulatory Visit: Payer: 59

## 2021-05-16 ENCOUNTER — Ambulatory Visit: Payer: 59

## 2021-05-21 ENCOUNTER — Other Ambulatory Visit: Payer: Self-pay

## 2021-05-21 ENCOUNTER — Ambulatory Visit: Payer: 59

## 2021-05-21 DIAGNOSIS — M25572 Pain in left ankle and joints of left foot: Secondary | ICD-10-CM | POA: Insufficient documentation

## 2021-05-21 DIAGNOSIS — R262 Difficulty in walking, not elsewhere classified: Secondary | ICD-10-CM

## 2021-05-21 DIAGNOSIS — M25571 Pain in right ankle and joints of right foot: Secondary | ICD-10-CM

## 2021-05-21 DIAGNOSIS — Z9889 Other specified postprocedural states: Secondary | ICD-10-CM

## 2021-05-21 DIAGNOSIS — R2681 Unsteadiness on feet: Secondary | ICD-10-CM

## 2021-05-21 NOTE — Addendum Note (Signed)
Addended by: Ileana Ladd on: 05/21/2021 12:12 PM ? ? Modules accepted: Orders ? ?

## 2021-05-21 NOTE — Therapy (Signed)
?OUTPATIENT PHYSICAL THERAPY  Note ? ? ?Patient Name: Veronica Hunter ?MRN: [id number] ?DOB:Jan 06, 1971, 51 y.o., female ?Today's Date: 05/21/2021  ? ? ?PCP: Percell Belt, DO ?REFERRING PROVIDER: Corky Sing, PA-C ? ? PT End of Session - 05/21/21 1126   ? ? Visit Number 14   ? Number of Visits 20   ? Date for PT Re-Evaluation 09/05/21   ? Authorization Type [employer]; recertification 7/0/92; 30 v max per year   ? PT Start Time 1100   ? PT Stop Time 1145   ? PT Time Calculation (min) 45 min   ? Activity Tolerance Patient tolerated treatment well   ? Behavior During Therapy [facility] for tasks assessed/performed   ? ?  ?  ? ?  ? ? ?Past Medical History:  ?Diagnosis Date  ? H/O fracture 2020  ? left foot, right ankle  ? ?Past Surgical History:  ?Procedure Laterality Date  ? GALLBLADDER SURGERY  1996  ? right ankle fracture surgery  2020  ? 2 plates/10 screws   ? right ankle tendon repair  2010  ? TONSILLECTOMY  1996  ? TOTAL VAGINAL HYSTERECTOMY  2009  ? ?Patient Active Problem List  ? Diagnosis Date Noted  ? Difficulty in walking, not elsewhere classified 05/21/2021  ? Pain in left ankle and joints of left foot 05/21/2021  ? Myofascial pain syndrome, cervical 02/12/2021  ? Dizziness 04/07/2020  ? Cervicalgia 04/07/2020  ? ? 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 ?THERAPY DIAG:  ?Difficulty in walking, not elsewhere classified ? ?Pain in left ankle and joints of left foot ? ?Pain in right ankle and joints of right foot ? ?Unsteadiness on feet ? ?History of ankle surgery ? ?PERTINENT HISTORY: Previous ankle surgery, hx of vertigo ? ?ONSET DATE: 12/15/20 ? ?PRECAUTIONS: none ? ?SUBJECTIVE: Last couple of weeks, I have noticed improved R ankle ROM. She still gets flare ups. Lately she has noticed her R hip bothering her. ?PAIN:  ?Are you having pain? Yes ?VAS scale: 4/10 ?Pain location: ankle, back ?Pain orientation: Right, Medial, Lateral, and Anterior  ?PAIN TYPE: aching, burning,  sharp, throbbing, and tight ?Pain description: constant and sharp  ?Aggravating factors: WB, movement ?Relieving factors: rest ? ? ?LE AROM/PROM: ?  ?A/PROM Right ?03/12/21 Left ?03/12/21 Right ?04/11/21 Right ?05/21/2021 ?  ?Ankle dorsiflexion 0 15 10 12   ?Ankle plantarflexion 25 55 25* 35  ?Ankle inversion 2 45 4 10  ?Ankle eversion 6 30 8 15   ? (Blank rows = not tested) ?  ?LE MMT: ?  ?MMT Right ?03/12/21 Left ?03/12/21  ?Ankle dorsiflexion 5 5  ?Ankle plantarflexion 2+ 5  ?Ankle inversion 1+ 5  ?Ankle eversion 1+ 55  ? (Blank rows = not tested) ?  ?Functional testing:01/15/21 ?- 10 meter walk test: 0.39 m/s with st. cane ?- 5x sit to stand: 15.35 sec without HHA ?- Modified CTSIB: 120/120 seconds ?- SLS: R LE: 0 sec; L LE: 11 sec ?  ?  ?TODAY'S TREATMENT: ?05/21/21: ?Pt tender over R greater trochanteric bursitis. Pt educated on icing and resting from activities that aggravate it. Pt educated that this may be due to compensation as her gait is changing. ?Manual soft tissue mobilization: to anterior ankle, lateral ankle, peroneals, anterior tibialis ?Active soft tissue release to anteriro tib distal tendons with plantar flexion: 5' with rest breaks ?Active soft tissue release to lateral ankle with foot inversion: 5' with rest breaks ?R sidelying AROM with inversion: 30x ?  Standing toe curls: 3 x 10 R and L  ?\Standing partial tandem with practicing push off with red band around R foot for resistance: 3 x 10, verbal and taactil cues required for neutral foot positioning for R foot; performed bil but R foot with resistance of red band ?Reviewed patient's progress thus far, educated pt on compliance with toe curls and partial tandem push off with neutral foot positioning everyday. ? ? ?05/12/21: ?Manual soft tissue mobilization: to anterior ankle, lateral ankle, peroneals, anterior tibialis ?Active soft tissue release to anteriro tib distal tendons with plantar flexion: 20x ?Passive stretch into ankle plantarflexion and  inversion ?Standing toe curls: 3 x 10 R only- verbally added to HEP ?Bil leg press: 100 lbs 2 x 10 ?Uni leg press: 60 lbs 2 x 10 R and L ?Figure 8 walking: 2 x 15 feet R and L ?Toe walking and heel walking: 2 x 10 ?Partial tandem push off with back leg with PT manually resisting at chest for core activation and manual resistance: 2 x 10 R and L ? ? ? ?  ?PATIENT EDUCATION:  ?Education details: Educated on pain management with ice and exercises ?Person educated: Patient ?Education method: Explanation ?Education comprehension: verbalized understanding ?  ?  ?HOME EXERCISE PROGRAM: ?Access Code [email or web address] ?03/21/21: Pt asked to do toe spreading and toe curling exercises ?  ?ASSESSMENT: ?  ?CLINICAL IMPRESSION: ?Patient has been seen for total of 14 sessions from 03/12/21 to 05/21/21 s/p hardware removal in R ankle. Patient has demonstrated significant improvement in her gross R ankle ROM since her last reassessment. Pt still has hypomobility in R ankle and soft tissue restrictions that is limiting her R ankle ROM. Pt still has potential to improve her R ankle ROM. Patient still requires continued skilled PT to improve her neuromuscular control in her R ankle with functional gait and balance to restore function of R ankle. ?  ?REHAB POTENTIAL: Good ?  ?CLINICAL DECISION MAKING: Stable/uncomplicated ?  ?EVALUATION COMPLEXITY: Low ?  ?  ?GOALS: ?Goals reviewed with patient? Yes ?  ?SHORT TERM GOALS: ?  ?STG Name Target Date Goal status  ?1 Pt will be able to resume driving with <9/93 pain in R ankle ?Baseline: has not resumed driving (eval) 09/06/6965 ? Not met  ?2 Pt will demo 5 deg of AROM of R ankle dorsiflexion to improve gait ?Baseline: 0 deg (eval); 15 deg 05/21/21 04/09/2021 Goal met   ?3 Pt will demo 10 deg of ankle inversion in R ankle to improve ability to pivot on R LE ?Baseline:2 deg (eval); 10 deg 05/07/21 04/09/2021 Goal met  ?4 Pt will be able to perform 5x sit to stand <12 sec without HHA to improve functional strength  of LE ?Baseline: 15 sec (eval) 04/09/2021 Not assessed  ?5.  Patient will demo 15 deg of R ankle ivnersion ROM to improve ankle ROM on uneven surfaces 07/25/21 Revised 05/21/21  ?  ?LONG TERM GOALS:  ?  ?LTG Name Target Date Goal status  ?1 Pt will demo gait speed without AD that is >1 m/s to improve community ambulation ?Baseline:0.83ms with st. Cane (Eval) 09/05/21 ? On going  ?2 Pt will be able to perform 10 unilateral heel raises to improve strength of plantarflexors in R LE ?Baseline: pt unable to perform standing unilateral heel raise (eva); 05/21/21: unable 05/21/21 09/05/21 on going  ?3 Pt will be able to ambulate 400 feet on grass without AD and without pain in ankle to improve community access ?  Baseline: not attempted (eval); not attempted 05/21/21 09/05/21 On going  ?  ?PLAN: ?PT FREQUENCY: 2x/month for 6 more sessions ?  ?PT DURATION: 09/05/21 ?  ?PLANNED INTERVENTIONS: Therapeutic exercises, Therapeutic activity, Neuro Muscular re-education, Balance training, Gait training, Patient/Family education, Joint mobilization, Stair training, Dry Needling, Cryotherapy, Moist heat, scar mobilization, Taping, and Manual therapy ?  ?PLAN FOR NEXT SESSION: Continue with neuromuscular re-ed to improve push off during gait, work on ROM of ankle and strength ? ? ? ?Kerrie Pleasure, PT ?05/21/2021, 12:07 PM ? ? ? ? ? ? ? ? ? ? ? ? ? ? ? ? ? ? ? ? ? ? ? ? ? ? ? ? ? ? ? ? ? ? ? ? ? ? ? ? ? ? ? ? ? ?  ? ?

## 2021-05-23 ENCOUNTER — Ambulatory Visit: Payer: 59

## 2021-06-04 ENCOUNTER — Ambulatory Visit: Payer: 59

## 2021-06-04 DIAGNOSIS — M25572 Pain in left ankle and joints of left foot: Secondary | ICD-10-CM

## 2021-06-04 DIAGNOSIS — R262 Difficulty in walking, not elsewhere classified: Secondary | ICD-10-CM

## 2021-06-04 DIAGNOSIS — Z9889 Other specified postprocedural states: Secondary | ICD-10-CM

## 2021-06-04 DIAGNOSIS — M25571 Pain in right ankle and joints of right foot: Secondary | ICD-10-CM

## 2021-06-04 DIAGNOSIS — R2681 Unsteadiness on feet: Secondary | ICD-10-CM

## 2021-06-04 NOTE — Therapy (Signed)
?OUTPATIENT PHYSICAL THERAPY  Note ? ? ?Patient Name: Veronica Hunter ?MRN: [id number] ?DOB:1971-03-04, 51 y.o., female ?Today's Date: 06/04/2021  ? ? ?PCP: Percell Belt, DO ?REFERRING PROVIDER: Corky Sing, PA-C ? ? PT End of Session - 06/04/21 1541   ? ? Visit Number 15   ? Number of Visits 20   ? Date for PT Re-Evaluation 09/05/21   ? Authorization Type U[employer]; recertification 0/2/72; 30 v max per year   ? PT Start Time 1400   ? PT Stop Time 5366   ? PT Time Calculation (min) 45 min   ? Activity Tolerance Patient tolerated treatment well   ? Behavior During Therapy [facility] for tasks assessed/performed   ? ?  ?  ? ?  ? ? ?Past Medical History:  ?Diagnosis Date  ? H/O fracture 2020  ? left foot, right ankle  ? ?Past Surgical History:  ?Procedure Laterality Date  ? GALLBLADDER SURGERY  1996  ? right ankle fracture surgery  2020  ? 2 plates/10 screws   ? right ankle tendon repair  2010  ? TONSILLECTOMY  1996  ? TOTAL VAGINAL HYSTERECTOMY  2009  ? ?Patient Active Problem List  ? Diagnosis Date Noted  ? Difficulty in walking, not elsewhere classified 05/21/2021  ? Pain in left ankle and joints of left foot 05/21/2021  ? Myofascial pain syndrome, cervical 02/12/2021  ? Dizziness 04/07/2020  ? Cervicalgia 04/07/2020  ? ? 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 ?THERAPY DIAG:  ?Difficulty in walking, not elsewhere classified ? ?Pain in left ankle and joints of left foot ? ?Pain in right ankle and joints of right foot ? ?Unsteadiness on feet ? ?History of ankle surgery ? ?PERTINENT HISTORY: Previous ankle surgery, hx of vertigo ? ?ONSET DATE: 12/15/20 ? ?PRECAUTIONS: none ? ?SUBJECTIVE: Pt reports that pain is controlled. She is trying to do little bit of bare foot walking at home. ?PAIN:  ?Are you having pain? Yes ?VAS scale: 2/10 ?Pain location: ankle, back ?Pain orientation: Right, Medial, Lateral, and Anterior  ?PAIN TYPE: aching, burning, sharp, throbbing, and tight ?Pain  description: constant and sharp  ?Aggravating factors: WB, movement ?Relieving factors: rest ? ? ?LE AROM/PROM: ?  ?A/PROM Right ?03/12/21 Left ?03/12/21 Right ?04/11/21 Right ?05/21/2021 ?  ?Ankle dorsiflexion 0 15 10 12   ?Ankle plantarflexion 25 55 25* 35  ?Ankle inversion 2 45 4 10  ?Ankle eversion 6 30 8 15   ? (Blank rows = not tested) ?  ?LE MMT: ?  ?MMT Right ?03/12/21 Left ?03/12/21  ?Ankle dorsiflexion 5 5  ?Ankle plantarflexion 2+ 5  ?Ankle inversion 1+ 5  ?Ankle eversion 1+ 55  ? (Blank rows = not tested) ?  ?Functional testing:01/15/21 ?- 10 meter walk test: 0.39 m/s with st. cane ?- 5x sit to stand: 15.35 sec without HHA ?- Modified CTSIB: 120/120 seconds ?- SLS: R LE: 0 sec; L LE: 11 sec ?  ?  ?TODAY'S TREATMENT: ?06/04/21 ?Manual therapy: Soft tissue work to anterior tib, peroneals, medial/lateral malleoli around bony prominences ?Manually stretched foot into plantar flexion, inversion (pt still has lot of active resistance with stretching into inversion) ?Q-ped: ankle plantar flexion stretch: 3 x 30" bil ?Reviewed exercises ?Pt educated on not to do lot of bare foot walking to prevent flare ups. ? ?05/21/21: ?Pt tender over R greater trochanteric bursitis. Pt educated on icing and resting from activities that aggravate it. Pt educated that this may be due to  compensation as her gait is changing. ?Manual soft tissue mobilization: to anterior ankle, lateral ankle, peroneals, anterior tibialis ?Active soft tissue release to anteriro tib distal tendons with plantar flexion: 5' with rest breaks ?Active soft tissue release to lateral ankle with foot inversion: 5' with rest breaks ?R sidelying AROM with inversion: 30x ?Standing toe curls: 3 x 10 R and L  ?\Standing partial tandem with practicing push off with red band around R foot for resistance: 3 x 10, verbal and taactil cues required for neutral foot positioning for R foot; performed bil but R foot with resistance of red band ?Reviewed patient's progress thus far,  educated pt on compliance with toe curls and partial tandem push off with neutral foot positioning everyday. ? ? ?05/12/21: ?Manual soft tissue mobilization: to anterior ankle, lateral ankle, peroneals, anterior tibialis ?Active soft tissue release to anteriro tib distal tendons with plantar flexion: 20x ?Passive stretch into ankle plantarflexion and inversion ?Standing toe curls: 3 x 10 R only- verbally added to HEP ?Bil leg press: 100 lbs 2 x 10 ?Uni leg press: 60 lbs 2 x 10 R and L ?Figure 8 walking: 2 x 15 feet R and L ?Toe walking and heel walking: 2 x 10 ?Partial tandem push off with back leg with PT manually resisting at chest for core activation and manual resistance: 2 x 10 R and L ? ? ? ?  ?PATIENT EDUCATION:  ?Education details: Educated on pain management with ice and exercises ?Person educated: Patient ?Education method: Explanation ?Education comprehension: verbalized understanding ?  ?  ?HOME EXERCISE PROGRAM: ?Access Code [email or web address] ?03/21/21: Pt asked to do toe spreading and toe curling exercises ?  ?ASSESSMENT: ?  ?CLINICAL IMPRESSION: ?Pt continues to report compliance with HEP and pain is managed well. Pt still demonstrates lot of guarding with passive stretching. Overall intrrinsic joint mobility in ankle is improving. ?  ?REHAB POTENTIAL: Good ?  ?CLINICAL DECISION MAKING: Stable/uncomplicated ?  ?EVALUATION COMPLEXITY: Low ?  ?  ?GOALS: ?Goals reviewed with patient? Yes ?  ?SHORT TERM GOALS: ?  ?STG Name Target Date Goal status  ?1 Pt will be able to resume driving with <0/37 pain in R ankle ?Baseline: has not resumed driving (eval) 0/06/8887 ? Not met  ?2 Pt will demo 5 deg of AROM of R ankle dorsiflexion to improve gait ?Baseline: 0 deg (eval); 15 deg 05/21/21 04/09/2021 Goal met   ?3 Pt will demo 10 deg of ankle inversion in R ankle to improve ability to pivot on R LE ?Baseline:2 deg (eval); 10 deg 05/07/21 04/09/2021 Goal met  ?4 Pt will be able to perform 5x sit to stand <12 sec without HHA to improve  functional strength of LE ?Baseline: 15 sec (eval) 04/09/2021 Not assessed  ?5.  Patient will demo 15 deg of R ankle ivnersion ROM to improve ankle ROM on uneven surfaces 07/25/21 Revised 05/21/21  ?  ?LONG TERM GOALS:  ?  ?LTG Name Target Date Goal status  ?1 Pt will demo gait speed without AD that is >1 m/s to improve community ambulation ?Baseline:0.70ms with st. Cane (Eval) 09/05/21 ? On going  ?2 Pt will be able to perform 10 unilateral heel raises to improve strength of plantarflexors in R LE ?Baseline: pt unable to perform standing unilateral heel raise (eva); 05/21/21: unable 05/21/21 09/05/21 on going  ?3 Pt will be able to ambulate 400 feet on grass without AD and without pain in ankle to improve community access ?Baseline: not attempted (eval); not  attempted 05/21/21 09/05/21 On going  ?  ?PLAN: ?PT FREQUENCY: 2x/month for 6 more sessions ?  ?PT DURATION: 09/05/21 ?  ?PLANNED INTERVENTIONS: Therapeutic exercises, Therapeutic activity, Neuro Muscular re-education, Balance training, Gait training, Patient/Family education, Joint mobilization, Stair training, Dry Needling, Cryotherapy, Moist heat, scar mobilization, Taping, and Manual therapy ?  ?PLAN FOR NEXT SESSION: Continue with neuromuscular re-ed to improve push off during gait, work on ROM of ankle and strength ? ? ? ?Kerrie Pleasure, PT ?06/04/2021, 3:41 PM ? ? ? ? ? ? ? ? ? ? ? ? ? ? ? ? ? ? ? ? ? ? ? ? ? ? ? ? ? ? ? ? ? ? ? ? ? ? ? ? ? ? ? ? ? ?  ? ?

## 2021-06-20 ENCOUNTER — Ambulatory Visit: Payer: 59 | Attending: Family Medicine

## 2021-06-20 DIAGNOSIS — R262 Difficulty in walking, not elsewhere classified: Secondary | ICD-10-CM | POA: Diagnosis present

## 2021-06-20 DIAGNOSIS — M25571 Pain in right ankle and joints of right foot: Secondary | ICD-10-CM | POA: Diagnosis present

## 2021-06-20 DIAGNOSIS — R2681 Unsteadiness on feet: Secondary | ICD-10-CM | POA: Insufficient documentation

## 2021-06-20 DIAGNOSIS — M25572 Pain in left ankle and joints of left foot: Secondary | ICD-10-CM | POA: Insufficient documentation

## 2021-06-20 DIAGNOSIS — Z9889 Other specified postprocedural states: Secondary | ICD-10-CM | POA: Diagnosis present

## 2021-06-20 NOTE — Therapy (Signed)
?OUTPATIENT PHYSICAL THERAPY  Note ? ? ?Patient Name: Veronica Hunter ?MRN: [id number] ?DOB:1970-09-04, 51 y.o., female ?Today's Date: 06/20/2021  ? ? ?PCP: Percell Belt, DO ?REFERRING PROVIDER: Corky Sing, PA-C ? ? PT End of Session - 06/20/21 0943   ? ? Visit Number 16   ? Number of Visits 20   ? Date for PT Re-Evaluation 09/05/21   ? Authorization Type U[employer]; recertification 05/12/04; 30 v max per year   ? PT Start Time 0940   ? PT Stop Time 1020   ? PT Time Calculation (min) 40 min   ? Activity Tolerance Patient tolerated treatment well   ? Behavior During Therapy [facility] for tasks assessed/performed   ? ?  ?  ? ?  ? ? ? ?Past Medical History:  ?Diagnosis Date  ? H/O fracture 2020  ? left foot, right ankle  ? ?Past Surgical History:  ?Procedure Laterality Date  ? GALLBLADDER SURGERY  1996  ? right ankle fracture surgery  2020  ? 2 plates/10 screws   ? right ankle tendon repair  2010  ? TONSILLECTOMY  1996  ? TOTAL VAGINAL HYSTERECTOMY  2009  ? ?Patient Active Problem List  ? Diagnosis Date Noted  ? Difficulty in walking, not elsewhere classified 05/21/2021  ? Pain in left ankle and joints of left foot 05/21/2021  ? Myofascial pain syndrome, cervical 02/12/2021  ? Dizziness 04/07/2020  ? Cervicalgia 04/07/2020  ? ? 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 ?THERAPY DIAG:  ?Difficulty in walking, not elsewhere classified ? ?Pain in left ankle and joints of left foot ? ?Pain in right ankle and joints of right foot ? ?Unsteadiness on feet ? ?History of ankle surgery ? ?PERTINENT HISTORY: Previous ankle surgery, hx of vertigo ? ?ONSET DATE: 12/15/20 ? ?PRECAUTIONS: none ? ?SUBJECTIVE: I am moderating activities so I can get more mileage out my ankle. Pt is having 3 MRIs (both shoulders and neck) this coming up. ?PAIN:  ?Are you having pain? Yes ?VAS scale: 2/10 ?Pain location: ankle, back ?Pain orientation: Right, Medial, Lateral, and Anterior  ?PAIN TYPE: aching, burning,  sharp, throbbing, and tight ?Pain description: constant and sharp  ?Aggravating factors: WB, movement ?Relieving factors: rest ? ? ?LE AROM/PROM: ?  ?A/PROM Right ?03/12/21 Left ?03/12/21 Right ?04/11/21 Right ?05/21/2021 ?  ?Ankle dorsiflexion 0 15 10 12   ?Ankle plantarflexion 25 55 25* 35  ?Ankle inversion 2 45 4 10  ?Ankle eversion 6 30 8 15   ? (Blank rows = not tested) ?  ?LE MMT: ?  ?MMT Right ?03/12/21 Left ?03/12/21  ?Ankle dorsiflexion 5 5  ?Ankle plantarflexion 2+ 5  ?Ankle inversion 1+ 5  ?Ankle eversion 1+ 55  ? (Blank rows = not tested) ?  ?Functional testing:01/15/21 ?- 10 meter walk test: 0.39 m/s with st. cane ?- 5x sit to stand: 15.35 sec without HHA ?- Modified CTSIB: 120/120 seconds ?- SLS: R LE: 0 sec; L LE: 11 sec ?  ?  ?TODAY'S TREATMENT: ?06/20/21: ?Heel raises from 1/2 foam roll: 3 x 10  ?Ankle circles: standing CW and CCW on wobble board: 3 x 10 R only ?Resisted walking with black sport cord: 2 x 430' ?2 x 40' with arm swings, pt educate don how arm swings can improve her cadence and balance with walking ?Treadmill walking: 0.52mh with bil HHA; 5' on treadmill ?Grade IV subtalar joint distraction mobilization, distraction with ankle plantarflexion ?Soft tissue mobilization to anterior ankle with  functional plantar flexion mobilization ?Pt educated on performing quadruped rocking posterior exercise on floor to increase strength in anterior ankle compared to on her bed. ? ? ?06/04/21 ?Manual therapy: Soft tissue work to anterior tib, peroneals, medial/lateral malleoli around bony prominences ?Manually stretched foot into plantar flexion, inversion (pt still has lot of active resistance with stretching into inversion) ?Q-ped: ankle plantar flexion stretch: 3 x 30" bil ?Reviewed exercises ?Pt educated on not to do lot of bare foot walking to prevent flare ups. ? ?05/21/21: ?Pt tender over R greater trochanteric bursitis. Pt educated on icing and resting from activities that aggravate it. Pt educated that this  may be due to compensation as her gait is changing. ?Manual soft tissue mobilization: to anterior ankle, lateral ankle, peroneals, anterior tibialis ?Active soft tissue release to anteriro tib distal tendons with plantar flexion: 5' with rest breaks ?Active soft tissue release to lateral ankle with foot inversion: 5' with rest breaks ?R sidelying AROM with inversion: 30x ?Standing toe curls: 3 x 10 R and L  ?\Standing partial tandem with practicing push off with red band around R foot for resistance: 3 x 10, verbal and taactil cues required for neutral foot positioning for R foot; performed bil but R foot with resistance of red band ?Reviewed patient's progress thus far, educated pt on compliance with toe curls and partial tandem push off with neutral foot positioning everyday. ? ? ?05/12/21: ?Manual soft tissue mobilization: to anterior ankle, lateral ankle, peroneals, anterior tibialis ?Active soft tissue release to anteriro tib distal tendons with plantar flexion: 20x ?Passive stretch into ankle plantarflexion and inversion ?Standing toe curls: 3 x 10 R only- verbally added to HEP ?Bil leg press: 100 lbs 2 x 10 ?Uni leg press: 60 lbs 2 x 10 R and L ?Figure 8 walking: 2 x 15 feet R and L ?Toe walking and heel walking: 2 x 10 ?Partial tandem push off with back leg with PT manually resisting at chest for core activation and manual resistance: 2 x 10 R and L ? ? ? ?  ?PATIENT EDUCATION:  ?Education details: Educated on pain management with ice and exercises ?Person educated: Patient ?Education method: Explanation ?Education comprehension: verbalized understanding ?  ?  ?HOME EXERCISE PROGRAM: ?Access Code [email or web address] ?03/21/21: Pt asked to do toe spreading and toe curling exercises ?  ?ASSESSMENT: ?  ?CLINICAL IMPRESSION: ?Pt is demonstrating improivng gait speed compared to last reassessment. Joint mobility is gradually improving in her R ankle. Pt is dmeonstrating improving push off with R LE during mid to late stnace  phase. Still very weak ankle plantarflexors as patient is unable to perform unilateral heel raise on R LE. ?  ?REHAB POTENTIAL: Good ?  ?CLINICAL DECISION MAKING: Stable/uncomplicated ?  ?EVALUATION COMPLEXITY: Low ?  ?  ?GOALS: ?Goals reviewed with patient? Yes ?  ?SHORT TERM GOALS: ?  ?STG Name Target Date Goal status  ?1 Pt will be able to resume driving with <6/43 pain in R ankle ?Baseline: has not resumed driving (eval) 05/08/9516 ? Not met  ?2 Pt will demo 5 deg of AROM of R ankle dorsiflexion to improve gait ?Baseline: 0 deg (eval); 15 deg 05/21/21 04/09/2021 Goal met   ?3 Pt will demo 10 deg of ankle inversion in R ankle to improve ability to pivot on R LE ?Baseline:2 deg (eval); 10 deg 05/07/21 04/09/2021 Goal met  ?4 Pt will be able to perform 5x sit to stand <12 sec without HHA to improve functional strength of  LE ?Baseline: 15 sec (eval) 04/09/2021 Not assessed  ?5.  Patient will demo 15 deg of R ankle ivnersion ROM to improve ankle ROM on uneven surfaces 07/25/21 Revised 05/21/21  ?  ?LONG TERM GOALS:  ?  ?LTG Name Target Date Goal status  ?1 Pt will demo gait speed without AD that is >1 m/s to improve community ambulation ?Baseline:0.18ms with st. Cane (Eval); 0.966m without AD 06/20/21 09/05/21 ? On going  ?2 Pt will be able to perform 10 unilateral heel raises to improve strength of plantarflexors in R LE ?Baseline: pt unable to perform standing unilateral heel raise (eva); 05/21/21: unable 05/21/21; 1 rep 06/20/21 09/05/21 on going  ?3 Pt will be able to ambulate 400 feet on grass without AD and without pain in ankle to improve community access ?Baseline: not attempted (eval); not attempted 05/21/21 09/05/21 On going  ?  ?PLAN: ?PT FREQUENCY: 2x/month for 6 more sessions ?  ?PT DURATION: 09/05/21 ?  ?PLANNED INTERVENTIONS: Therapeutic exercises, Therapeutic activity, Neuro Muscular re-education, Balance training, Gait training, Patient/Family education, Joint mobilization, Stair training, Dry Needling, Cryotherapy,  Moist heat, scar mobilization, Taping, and Manual therapy ?  ?PLAN FOR NEXT SESSION: Continue with neuromuscular re-ed to improve push off during gait, work on ROM of ankle and strength ? ? ? ?KaKerrie Pleasure

## 2021-07-02 ENCOUNTER — Ambulatory Visit: Payer: 59

## 2021-07-02 DIAGNOSIS — R2681 Unsteadiness on feet: Secondary | ICD-10-CM

## 2021-07-02 DIAGNOSIS — R262 Difficulty in walking, not elsewhere classified: Secondary | ICD-10-CM | POA: Diagnosis not present

## 2021-07-02 DIAGNOSIS — Z9889 Other specified postprocedural states: Secondary | ICD-10-CM

## 2021-07-02 DIAGNOSIS — M25571 Pain in right ankle and joints of right foot: Secondary | ICD-10-CM

## 2021-07-02 DIAGNOSIS — M25572 Pain in left ankle and joints of left foot: Secondary | ICD-10-CM

## 2021-07-02 NOTE — Therapy (Signed)
?OUTPATIENT PHYSICAL THERAPY  Note ? ? ?Patient Name: Veronica Hunter ?MRN: [id number] ?DOB:21-Mar-1970, 51 y.o., female ?Today's Date: 07/02/2021  ? ? ?PCP: Percell Belt, DO ?REFERRING PROVIDER: Corky Sing, PA-C ? ? PT End of Session - 07/02/21 0944   ? ? Visit Number 17   ? Number of Visits 20   ? Date for PT Re-Evaluation 09/05/21   ? Authorization Type U[employer]; recertification 2/0/94; 30 v max per year   ? PT Start Time 0930   ? PT Stop Time 1015   ? PT Time Calculation (min) 45 min   ? Activity Tolerance Patient tolerated treatment well   ? Behavior During Therapy [facility] for tasks assessed/performed   ? ?  ?  ? ?  ? ? ? ?Past Medical History:  ?Diagnosis Date  ? H/O fracture 2020  ? left foot, right ankle  ? ?Past Surgical History:  ?Procedure Laterality Date  ? GALLBLADDER SURGERY  1996  ? right ankle fracture surgery  2020  ? 2 plates/10 screws   ? right ankle tendon repair  2010  ? TONSILLECTOMY  1996  ? TOTAL VAGINAL HYSTERECTOMY  2009  ? ?Patient Active Problem List  ? Diagnosis Date Noted  ? Difficulty in walking, not elsewhere classified 05/21/2021  ? Pain in left ankle and joints of left foot 05/21/2021  ? Myofascial pain syndrome, cervical 02/12/2021  ? Dizziness 04/07/2020  ? Cervicalgia 04/07/2020  ? ? 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 ?THERAPY DIAG:  ?Difficulty in walking, not elsewhere classified ? ?Pain in left ankle and joints of left foot ? ?Pain in right ankle and joints of right foot ? ?Unsteadiness on feet ? ?History of ankle surgery ? ?PERTINENT HISTORY: Previous ankle surgery, hx of vertigo ? ?ONSET DATE: 12/15/20 ? ?PRECAUTIONS: none ? ?SUBJECTIVE: symptoms are about the same. ?PAIN:  ?Are you having pain? Yes ?VAS scale: 4/10 ?Pain location: ankle, back ?Pain orientation: Right, Medial, Lateral, and Anterior  ?PAIN TYPE: aching, burning, sharp, throbbing, and tight ?Pain description: constant and sharp  ?Aggravating factors: WB,  movement ?Relieving factors: rest ? ? ?LE AROM/PROM: ?  ?A/PROM Right ?03/12/21 Left ?03/12/21 Right ?04/11/21 Right ?05/21/2021 ?  ?Ankle dorsiflexion 0 _0 ?Ankle plantarflexion 25 55 25* 35  ?Ankle inversion 2 45 4 10  ?Ankle eversion _1 ? (Blank rows = not tested) ?  ?LE MMT: ?  ?MMT Right ?03/12/21 Left ?03/12/21  ?Ankle dorsiflexion 5 5  ?Ankle plantarflexion 2+ 5  ?Ankle inversion 1+ 5  ?Ankle eversion 1+ 55  ? (Blank rows = not tested) ?  ?Functional testing:01/15/21 ?- 10 meter walk test: 0.39 m/s with st. cane ?- 5x sit to stand: 15.35 sec without HHA ?- Modified CTSIB: 120/120 seconds ?- SLS: R LE: 0 sec; L LE: 11 sec ?  ?  ?TODAY'S TREATMENT: ?07/02/21: ?Standing on tilt board: anterior/posterior and medial/lateral tilts: 30x without HHA ?SLS on foam: 5 x 10" holds R and L ?SLS on floor: 5 x 20" R and L ?Pt educated on limits of stability, balance reactions, proprioception and how solid floor vs foam challenges different balance systems. ?Tandem walking on foam beam: fwd only: 10 laps ?Lateral stepping over 8" hurdles on foam beam: 2 x R and L over 4 hurdles ?Gait training: walking on grass, pt educated to look 2 feet in front of her when walking on grass, pt reported improved confidence and decreased  pain vs looking straight ahead in front of her. Pt educated on how vision plays a role with balance and if looking down improve her ankle pain/foot pain she should look down when walking on grass to improve pain, and confidence. ?For HEP: pt educated to work on SLS on floor without HHA and touching with contralateral LE to react with balance loss rather than using UE. Pt educated on being mindful of 50/50 weight distribution with SLS between forefoot and rear foot. Pt educated on tandem walking fwd and bwd at home working on stepping reaction when LOB occurs before using UE  ? ? ?06/20/21: ?Heel raises from 1/2 foam roll: 3 x 10  ?Ankle circles: standing CW and CCW on wobble board: 3 x 10 R only ?Resisted  walking with black sport cord: 2 x 430' ?2 x 40' with arm swings, pt educate don how arm swings can improve her cadence and balance with walking ?Treadmill walking: 0.37mh with bil HHA; 5' on treadmill ?Grade IV subtalar joint distraction mobilization, distraction with ankle plantarflexion ?Soft tissue mobilization to anterior ankle with functional plantar flexion mobilization ?Pt educated on performing quadruped rocking posterior exercise on floor to increase strength in anterior ankle compared to on her bed. ? ? ?06/04/21 ?Manual therapy: Soft tissue work to anterior tib, peroneals, medial/lateral malleoli around bony prominences ?Manually stretched foot into plantar flexion, inversion (pt still has lot of active resistance with stretching into inversion) ?Q-ped: ankle plantar flexion stretch: 3 x 30" bil ?Reviewed exercises ?Pt educated on not to do lot of bare foot walking to prevent flare ups. ? ?05/21/21: ?Pt tender over R greater trochanteric bursitis. Pt educated on icing and resting from activities that aggravate it. Pt educated that this may be due to compensation as her gait is changing. ?Manual soft tissue mobilization: to anterior ankle, lateral ankle, peroneals, anterior tibialis ?Active soft tissue release to anteriro tib distal tendons with plantar flexion: 5' with rest breaks ?Active soft tissue release to lateral ankle with foot inversion: 5' with rest breaks ?R sidelying AROM with inversion: 30x ?Standing toe curls: 3 x 10 R and L  ?\Standing partial tandem with practicing push off with red band around R foot for resistance: 3 x 10, verbal and taactil cues required for neutral foot positioning for R foot; performed bil but R foot with resistance of red band ?Reviewed patient's progress thus far, educated pt on compliance with toe curls and partial tandem push off with neutral foot positioning everyday. ? ? ?05/12/21: ?Manual soft tissue mobilization: to anterior ankle, lateral ankle, peroneals,  anterior tibialis ?Active soft tissue release to anteriro tib distal tendons with plantar flexion: 20x ?Passive stretch into ankle plantarflexion and inversion ?Standing toe curls: 3 x 10 R only- verbally added to HEP ?Bil leg press: 100 lbs 2 x 10 ?Uni leg press: 60 lbs 2 x 10 R and L ?Figure 8 walking: 2 x 15 feet R and L ?Toe walking and heel walking: 2 x 10 ?Partial tandem push off with back leg with PT manually resisting at chest for core activation and manual resistance: 2 x 10 R and L ? ? ? ?  ?PATIENT EDUCATION:  ?Education details: see today's treatment ?Person educated: Patient ?Education method: Explanation ?Education comprehension: verbalized understanding ?  ?  ?HOME EXERCISE PROGRAM: ?Access Code [email or web address]?03/21/21: Pt asked to do toe spreading and toe curling exercises ?  ?ASSESSMENT: ?  ?CLINICAL IMPRESSION: ?Pt's proprioception is poor on R LE. On foam, pt has excessive  WB through rear foot on R LE and pt unable to shift weight to anterior portion of foot and maintain 50/50 weight distritbution on forefoot and rear foot with SLS on R LE on foam. Will continue to beenfit from skilled PT to improve balance reactions and proprioception, ROM, strength. ?  ?REHAB POTENTIAL: Good ?  ?CLINICAL DECISION MAKING: Stable/uncomplicated ?  ?EVALUATION COMPLEXITY: Low ?  ?  ?GOALS: ?Goals reviewed with patient? Yes ?  ?SHORT TERM GOALS: ?  ?STG Name Target Date Goal status  ?1 Pt will be able to resume driving with <6/94 pain in R ankle ?Baseline: has not resumed driving (eval) 10/11/4625 ? Not met  ?2 Pt will demo 5 deg of AROM of R ankle dorsiflexion to improve gait ?Baseline: 0 deg (eval); 15 deg 05/21/21 04/09/2021 Goal met   ?3 Pt will demo 10 deg of ankle inversion in R ankle to improve ability to pivot on R LE ?Baseline:2 deg (eval); 10 deg 05/07/21 04/09/2021 Goal met  ?4 Pt will be able to perform 5x sit to stand <12 sec without HHA to improve functional strength of LE ?Baseline: 15 sec (eval) 04/09/2021 Not  assessed  ?5.  Patient will demo 15 deg of R ankle ivnersion ROM to improve ankle ROM on uneven surfaces 07/25/21 Revised 05/21/21  ?  ?LONG TERM GOALS:  ?  ?LTG Name Target Date Goal status  ?1 Pt will demo gait speed withou

## 2021-07-03 ENCOUNTER — Encounter: Payer: Self-pay | Admitting: Neurology

## 2021-07-03 ENCOUNTER — Ambulatory Visit: Payer: 59 | Admitting: Neurology

## 2021-07-16 ENCOUNTER — Ambulatory Visit: Payer: 59 | Attending: Family Medicine

## 2021-07-16 ENCOUNTER — Ambulatory Visit: Payer: 59 | Admitting: Neurology

## 2021-07-16 DIAGNOSIS — M25571 Pain in right ankle and joints of right foot: Secondary | ICD-10-CM | POA: Insufficient documentation

## 2021-07-16 DIAGNOSIS — M25572 Pain in left ankle and joints of left foot: Secondary | ICD-10-CM | POA: Insufficient documentation

## 2021-07-16 DIAGNOSIS — R2681 Unsteadiness on feet: Secondary | ICD-10-CM | POA: Insufficient documentation

## 2021-07-16 DIAGNOSIS — R262 Difficulty in walking, not elsewhere classified: Secondary | ICD-10-CM | POA: Insufficient documentation

## 2021-07-16 NOTE — Therapy (Signed)
?OUTPATIENT PHYSICAL THERAPY  Note ? ? ?Patient Name: Veronica Hunter ?MRN: [id number] ?DOB:[birth year], 51 y.o., female ?Today's Date: 07/16/2021  ? ? ?PCP: Percell Belt, DO ?REFERRING PROVIDER: Corky Sing, PA-C ? ? PT End of Session - 07/16/21 1210   ? ? Visit Number [id number]   ? Number of Visits 20   ? Date for PT Re-Evaluation 09/05/21   ? Authorization Type [employer]; recertification 04/13/07; 30 v max per year   ? PT Start Time 1020   ? PT Stop Time 1105   ? PT Time Calculation (min) 45 min   ? Activity Tolerance Patient tolerated treatment well   ? Behavior During Therapy [facility] for tasks assessed/performed   ? ?  ?  ? ?  ? ? ? ? ?Past Medical History:  ?Diagnosis Date  ? H/O fracture 2020  ? left foot, right ankle  ? ?Past Surgical History:  ?Procedure Laterality Date  ? GALLBLADDER SURGERY  1996  ? right ankle fracture surgery  2020  ? 2 plates/10 screws   ? right ankle tendon repair  2010  ? TONSILLECTOMY  1996  ? TOTAL VAGINAL HYSTERECTOMY  2009  ? ?Patient Active Problem List  ? Diagnosis Date Noted  ? Difficulty in walking, not elsewhere classified 05/21/2021  ? Pain in left ankle and joints of left foot 05/21/2021  ? Myofascial pain syndrome, cervical 02/12/2021  ? Dizziness 04/07/2020  ? Cervicalgia 04/07/2020  ? ? 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 ?THERAPY DIAG:  ?Difficulty in walking, not elsewhere classified ? ?Pain in left ankle and joints of left foot ? ?Pain in right ankle and joints of right foot ? ?Unsteadiness on feet ? ?PERTINENT HISTORY: Previous ankle surgery, hx of vertigo ? ?ONSET DATE: 12/15/20 ? ?PRECAUTIONS: none ? ?SUBJECTIVE: symptoms are about the same. ?PAIN:  ?Are you having pain? Yes ?VAS scale: 4/10 ?Pain location: ankle, back ?Pain orientation: Right, Medial, Lateral, and Anterior  ?PAIN TYPE: aching, burning, sharp, throbbing, and tight ?Pain description: constant and sharp  ?Aggravating factors: WB, movement ?Relieving factors:  rest ? ? ?LE AROM/PROM: ?  ?A/PROM Right ?03/12/21 Left ?03/12/21 Right ?04/11/21 Right ?05/21/2021 ?  ?Ankle dorsiflexion 0 _0 ?Ankle plantarflexion 25 55 25* 35  ?Ankle inversion 2 45 4 10  ?Ankle eversion _1 ? (Blank rows = not tested) ?  ?LE MMT: ?  ?MMT Right ?03/12/21 Left ?03/12/21  ?Ankle dorsiflexion 5 5  ?Ankle plantarflexion 2+ 5  ?Ankle inversion 1+ 5  ?Ankle eversion 1+ 55  ? (Blank rows = not tested) ?  ?Functional testing:01/15/21 ?- 10 meter walk test: 0.39 m/s with st. cane ?- 5x sit to stand: 15.35 sec without HHA ?- Modified CTSIB: 120/120 seconds ?- SLS: R LE: 0 sec; L LE: 11 sec ?  ?  ?TODAY'S TREATMENT: ?07/16/21: ?Reassessed if pt is able to perform unilateral heel raise on R LE, pt is able to lift heel slightly but unable to lift it through full available range yet. ?Pt educated on performing following exercises at home: ? Standing with theraband under heel and patient holds them in her hand with bil UE on countertop in front, pt performs unilateral heel raise (AA by theraband): ? - 10x with 2 blue therabands ? - 20x with 1 grey theraband , pt given 2 blue therabands and 2 grey therabands for home use. ?Pt educated on why gastroc strength is so important for  stabilizing her ankle and improving push off during gait. Reviewed standing push off exercise given in previous sessions with emphasis on consciously engaging R calf when pushing off with R oot ?Gait training: 4 x 115', 2x with normal walk and 2x with pt engaging calves during push off on ipsilateral side during late stance phase, also performed 4 x 40' during the session so pt can feel small and subtle changes in different places during her gait. ? ?  ?PATIENT EDUCATION:  ?Education details: see today's treatment ?Person educated: Patient ?Education method: Explanation ?Education comprehension: verbalized understanding ?  ?  ?HOME EXERCISE PROGRAM: ?Access Code [email or web address] ?03/21/21: Pt asked to do toe spreading and toe curling exercises ?   ?ASSESSMENT: ?  ?CLINICAL IMPRESSION: ?Pt still has significant weakness in her R calf where she is unable to perform standing unilateral heel raise to full available ROM. It has improved as she is starting to be able to lift the heel little off the floor. Pt demonstrates improve engagement of core, improved bil UE extensions during arm swing and improving trunk rotations when she engages calves during gait compared to when she walks her normal walk where her shoulders are forward with decreased arm swings. Pt reports fatigue in her R foot and ankle with pain with short distance o walk of 115' with engaging her calf muscles. Pt was educated on practicing this for short distances at home and then gradually increasing that. ?  ?REHAB POTENTIAL: Good ?  ?CLINICAL DECISION MAKING: Stable/uncomplicated ?  ?EVALUATION COMPLEXITY: Low ?  ?  ?GOALS: ?Goals reviewed with patient? Yes ?  ?SHORT TERM GOALS: ?  ?STG Name Target Date Goal status  ?1 Pt will be able to resume driving with <8/36 pain in R ankle ?Baseline: has not resumed driving (eval) 08/09/9474 ? Not met  ?2 Pt will demo 5 deg of AROM of R ankle dorsiflexion to improve gait ?Baseline: 0 deg (eval); 15 deg 05/21/21 04/09/2021 Goal met   ?3 Pt will demo 10 deg of ankle inversion in R ankle to improve ability to pivot on R LE ?Baseline:2 deg (eval); 10 deg 05/07/21 04/09/2021 Goal met  ?4 Pt will be able to perform 5x sit to stand <12 sec without HHA to improve functional strength of LE ?Baseline: 15 sec (eval) 04/09/2021 Not assessed  ?5.  Patient will demo 15 deg of R ankle ivnersion ROM to improve ankle ROM on uneven surfaces 07/25/21 Revised 05/21/21  ?  ?LONG TERM GOALS:  ?  ?LTG Name Target Date Goal status  ?1 Pt will demo gait speed without AD that is >1 m/s to improve community ambulation ?Baseline:0.80ms with st. Cane (Eval); 0.959m without AD 06/20/21 09/05/21 ? On going  ?2 Pt will be able to perform 10 unilateral heel raises to improve strength of plantarflexors  in R LE ?Baseline: pt unable to perform standing unilateral heel raise (eva); 05/21/21: unable 05/21/21; 1 rep 06/20/21 09/05/21 on going  ?3 Pt will be able to ambulate 400 feet on grass without AD and without pain in ankle to improve community access ?Baseline: not attempted (eval); not attempted 05/21/21 09/05/21 On going  ?  ?PLAN: ?PT FREQUENCY: 2x/month for 6 more sessions ?  ?PT DURATION: 09/05/21 ?  ?PLANNED INTERVENTIONS: Therapeutic exercises, Therapeutic activity, Neuro Muscular re-education, Balance training, Gait training, Patient/Family education, Joint mobilization, Stair training, Dry Needling, Cryotherapy, Moist heat, scar mobilization, Taping, and Manual therapy ?  ?PLAN FOR NEXT SESSION: Continue with neuromuscular re-ed to improve push  off during gait, work on ROM of ankle and strength ? ? ? ?Kerrie Pleasure, PT ?07/16/2021, 12:19 PM ? ? ? ? ? ? ? ? ? ? ? ? ? ? ? ?

## 2021-07-23 ENCOUNTER — Ambulatory Visit: Payer: 59

## 2021-08-12 ENCOUNTER — Ambulatory Visit (INDEPENDENT_AMBULATORY_CARE_PROVIDER_SITE_OTHER): Payer: 59 | Admitting: Neurology

## 2021-08-12 ENCOUNTER — Encounter: Payer: Self-pay | Admitting: Neurology

## 2021-08-12 VITALS — BP 144/86 | HR 85 | Ht 67.0 in | Wt 249.2 lb

## 2021-08-12 DIAGNOSIS — G8929 Other chronic pain: Secondary | ICD-10-CM

## 2021-08-12 DIAGNOSIS — M542 Cervicalgia: Secondary | ICD-10-CM | POA: Diagnosis not present

## 2021-08-12 DIAGNOSIS — M7918 Myalgia, other site: Secondary | ICD-10-CM

## 2021-08-12 MED ORDER — CYCLOBENZAPRINE HCL 5 MG PO TABS: 5.0000 mg | ORAL_TABLET | Freq: Every day | ORAL | 6 refills | Status: DC

## 2021-08-12 NOTE — Patient Instructions (Signed)
Flexeril at bedtime Physical therapy for neck Continue with [email or web address] Consider injections into the neck, discuss with orthopedics [facility] Recommend increased hydration, weight loss, possibly an ENT referral for dizziness to ensure no inner-ear etiology  Cyclobenzaprine Tablets What is this medication? CYCLOBENZAPRINE (sye kloe BEN za preen) treats muscle spasms. It works by relaxing your muscles, which reduces muscle stiffness. It belongs to a group of medications called muscle relaxants. This medicine may be used for other purposes; ask your health care provider or pharmacist if you have questions. COMMON BRAND NAME(S): Fexmid, Flexeril What should I tell my care team before I take this medication? They need to know if you have any of these conditions: Heart disease, irregular heartbeat, or previous heart attack Liver disease Thyroid problem An unusual or allergic reaction to cyclobenzaprine, tricyclic antidepressants, lactose, other medications, foods, dyes, or preservatives Pregnant or trying to get pregnant Breast-feeding How should I use this medication? Take this medication by mouth with a glass of water. Follow the directions on the prescription label. If this medication upsets your stomach, take it with food or milk. Take your medication at regular intervals. Do not take it more often than directed. Talk to your care team about the use of this medication in children. Special care may be needed. Overdosage: If you think you have taken too much of this medicine contact a poison control center or emergency room at once. NOTE: This medicine is only for you. Do not share this medicine with others. What if I miss a dose? If you miss a dose, take it as soon as you can. If it is almost time for your next dose, take only that dose. Do not take double or extra doses. What may interact with this medication? Do not take this medication with any of the  following: MAOIs like Carbex, Eldepryl, Marplan, Nardil, and Parnate Narcotic medications for cough Safinamide This medication may also interact with the following: Alcohol Bupropion Antihistamines for allergy, cough and cold Certain medications for anxiety or sleep Certain medications for bladder problems like oxybutynin, tolterodine Certain medications for depression like amitriptyline, fluoxetine, sertraline Certain medications for Parkinson's disease like benztropine, trihexyphenidyl Certain medications for seizures like phenobarbital, primidone Certain medications for stomach problems like dicyclomine, hyoscyamine Certain medications for travel sickness like scopolamine General anesthetics like halothane, isoflurane, methoxyflurane, propofol Ipratropium Local anesthetics like lidocaine, pramoxine, tetracaine Medications that relax muscles for surgery Narcotic medications for pain Phenothiazines like chlorpromazine, mesoridazine, prochlorperazine, thioridazine Verapamil This list may not describe all possible interactions. Give your health care provider a list of all the medicines, herbs, non-prescription drugs, or dietary supplements you use. Also tell them if you smoke, drink alcohol, or use illegal drugs. Some items may interact with your medicine. What should I watch for while using this medication? Tell your care team if your symptoms do not start to get better or if they get worse. You may get drowsy or dizzy. Do not drive, use machinery, or do anything that needs mental alertness until you know how this medication affects you. Do not stand or sit up quickly, especially if you are an older patient. This reduces the risk of dizzy or fainting spells. Alcohol may interfere with the effect of this medication. Avoid alcoholic drinks. If you are taking another medication that also causes drowsiness, you may have more side effects. Give your care team a list of all medications you use.  Your care team will tell you how much medication to take.  Do not take more medication than directed. Call emergency for help if you have problems breathing or unusual sleepiness. Your mouth may get dry. Chewing sugarless gum or sucking hard candy, and drinking plenty of water may help. Contact your care team if the problem does not go away or is severe. What side effects may I notice from receiving this medication? Side effects that you should report to your care team as soon as possible: Allergic reactions--skin rash, itching, hives, swelling of the face, lips, tongue, or throat CNS depression--slow or shallow breathing, shortness of breath, feeling faint, dizziness, confusion, trouble staying awake Heart rhythm changes--fast or irregular heartbeat, dizziness, feeling faint or lightheaded, chest pain, trouble breathing Side effects that usually do not require medical attention (report to your care team if they continue or are bothersome): Constipation Dizziness Drowsiness Dry mouth Fatigue Nausea This list may not describe all possible side effects. Call your doctor for medical advice about side effects. You may report side effects to FDA at 1-800-FDA-1088. Where should I keep my medication? Keep out of the reach of children. Store at room temperature between 15 and 30 degrees C (59 and 86 degrees F). Keep container tightly closed. Throw away any unused medication after the expiration date. NOTE: This sheet is a summary. It may not cover all possible information. If you have questions about this medicine, talk to your doctor, pharmacist, or health care provider.  2023 [employer]/Gold Standard (2020-06-06 00:00:00)
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
## 2021-08-13 ENCOUNTER — Ambulatory Visit: Payer: 59 | Attending: Family Medicine

## 2021-08-13 DIAGNOSIS — M542 Cervicalgia: Secondary | ICD-10-CM | POA: Insufficient documentation

## 2021-08-13 DIAGNOSIS — R262 Difficulty in walking, not elsewhere classified: Secondary | ICD-10-CM | POA: Insufficient documentation

## 2021-08-13 DIAGNOSIS — G8929 Other chronic pain: Secondary | ICD-10-CM | POA: Diagnosis not present

## 2021-08-13 DIAGNOSIS — M7918 Myalgia, other site: Secondary | ICD-10-CM | POA: Diagnosis not present

## 2021-08-13 DIAGNOSIS — R2681 Unsteadiness on feet: Secondary | ICD-10-CM | POA: Insufficient documentation

## 2021-08-13 DIAGNOSIS — R42 Dizziness and giddiness: Secondary | ICD-10-CM | POA: Insufficient documentation

## 2021-08-13 DIAGNOSIS — M25571 Pain in right ankle and joints of right foot: Secondary | ICD-10-CM | POA: Insufficient documentation

## 2021-08-13 DIAGNOSIS — M25572 Pain in left ankle and joints of left foot: Secondary | ICD-10-CM | POA: Insufficient documentation

## 2021-08-13 NOTE — Therapy (Signed)
OUTPATIENT PHYSICAL THERAPY CERVICAL EVALUATION   Patient Name: Veronica Hunter MRN: [id number] DOB:1970/08/20, 51 y.o., female Today's Date: 08/13/2021   PT End of Session - 08/13/21 1020     Visit Number 20    Number of Visits 30    Date for PT Re-Evaluation 09/05/21    Authorization Type [employer]; recertification 0/6/26; 30 v max per year    Progress Note Due on Visit 30    PT Start Time 1020    PT Stop Time 1105    PT Time Calculation (min) 45 min    Activity Tolerance Patient tolerated treatment well    Behavior During Therapy WFL for tasks assessed/performed             Past Medical History:  Diagnosis Date   H/O fracture 2020   left foot, right ankle   Past Surgical History:  Procedure Laterality Date   [place]   right ankle fracture surgery  2020   2 plates/10 screws    right ankle tendon repair  2010   [place]  2009   Patient Active Problem List   Diagnosis Date Noted   Difficulty in walking, not elsewhere classified 05/21/2021   Pain in left ankle and joints of left foot 05/21/2021   Myofascial pain syndrome, cervical 02/12/2021   Dizziness 04/07/2020   Cervicalgia 04/07/2020    PCP: Dr. Irene Pap  REFERRING PROVIDER: Dr Sarina Ill  REFERRING DIAG:  [phone] (ICD-10-CM) - Chronic neck pain  M79.18 (ICD-10-CM) - Cervical myofascial pain syndrome  M54.2 (ICD-10-CM) - Cervicalgia    THERAPY DIAG:  Difficulty in walking, not elsewhere classified  Pain in left ankle and joints of left foot  Pain in right ankle and joints of right foot  Unsteadiness on feet  Cervicalgia  Dizziness and giddiness  Rationale for Evaluation and Treatment Rehabilitation  ONSET DATE: 08/12/21 - date of referral  SUBJECTIVE:                                                                                                                                                                                                          SUBJECTIVE STATEMENT: Pt reports of chronic neck pain in left and right side of neck. When neck pain is worst, it can cause dizziness and vertigo like symptoms.She is currently seeing dry needling PT for neck pain (out of pocket expense). Pt reports at night sometimes she will feel burning sesation in R hand (middle finger and palm) and occasionally in left arm. Pain  can also cause headaches.  PERTINENT HISTORY:  Hx of R ankle surgery  PAIN:  Are you having pain? Yes: NPRS scale: 8/10 Pain location: neck pain, shoulder pain Pain description: dull, ache, throbbing Aggravating factors: stress, lifting, sleeping,  housework Relieving factors: dry needling, heat, exercises.  PRECAUTIONS: None  WEIGHT BEARING RESTRICTIONS No  FALLS:  Has patient fallen in last 6 months? No  LIVING ENVIRONMENT: Lives with: lives with their spouse Lives in: House/apartment Stairs: No Has following equipment at home: None  OCCUPATION: retired.  PLOF: Independent  PATIENT GOALS reduce neck pain  OBJECTIVE:    PATIENT SURVEYS:  NDI 26/50- 52% disability   COGNITION: Overall cognitive status: Within functional limits for tasks assessed   SENSATION: Not tested  POSTURE: rounded shoulders and forward head   CERVICAL ROM:   Active ROM A/PROM (deg) eval  Flexion 50  Extension 35  Right lateral flexion 25  Left lateral flexion 25  Right rotation 50  Left rotation 50   (Blank rows = not tested)  UPPER EXTREMITY ROM:  Active ROM Right eval Left eval  Shoulder flexion    Shoulder extension    Shoulder abduction    Shoulder adduction    Shoulder extension    Shoulder internal rotation    Shoulder external rotation    Elbow flexion    Elbow extension    Wrist flexion    Wrist extension    Wrist ulnar deviation    Wrist radial deviation    Wrist pronation    Wrist supination     (Blank rows = not tested)  UPPER EXTREMITY MMT:  MMT Right eval  Left eval  Shoulder flexion    Shoulder extension    Shoulder abduction    Shoulder adduction    Shoulder extension    Shoulder internal rotation    Shoulder external rotation    Middle trapezius    Lower trapezius    Elbow flexion    Elbow extension    Wrist flexion    Wrist extension    Wrist ulnar deviation    Wrist radial deviation    Wrist pronation    Wrist supination    Grip strength     (Blank rows = not tested)    TODAY'S TREATMENT:  Pt educated on chronic pain and tissue sensitivity. We discussed more active approach with AROM and strengthening to manage pain vs passive treatments of stretching or manual therapy as we have trialed that in past without any long term success. Pt is willing to perform dry needling out of pocket. Supine chin tuck with flexion: 2 x 10 R SL L lateral flexion of neck: only able to do 2 before reporting neck pain and shoulder pain L SL R lateral flexion of neck: only able to do 5 before reporting neck pain on both sides. Prone on elbows: 2 x 5 , pt was having some nausea Pt educated on do as many reps as she can in above positions and gradually work on increasing reps.  PATIENT EDUCATION:  Education details: see above Person educated: Patient Education method: Explanation Education comprehension: verbalized understanding   HOME EXERCISE PROGRAM: Access Code [email or web address]  ASSESSMENT:  CLINICAL IMPRESSION: Patient is a 51 y.o. female who was seen today for physical therapy evaluation and treatment for chronic neck pain with bil cervical radiculopathy and chronic headaches. Pt has been seen for total of 20 sessions for her R ankle rehab after recent ankle survey to remove hardware from her  ankle. We will continue to rehab her ankle as needed while providing treatment for neck pain and headaches.    OBJECTIVE IMPAIRMENTS Abnormal gait, decreased activity tolerance, decreased balance, decreased endurance, decreased mobility, difficulty  walking, decreased ROM, decreased strength, dizziness, hypomobility, increased fascial restrictions, increased muscle spasms, impaired flexibility, postural dysfunction, and pain.   ACTIVITY LIMITATIONS carrying, lifting, bending, standing, squatting, sleeping, and stairs  PARTICIPATION LIMITATIONS: meal prep, cleaning, laundry, driving, and yard work  PERSONAL FACTORS Past/current experiences and Time since onset of injury/illness/exacerbation are also affecting patient's functional outcome.   REHAB POTENTIAL: Good  CLINICAL DECISION MAKING: Stable/uncomplicated  EVALUATION COMPLEXITY: Low   GOALS: GOALS: Goals reviewed with patient? Yes   SHORT TERM GOALS:   STG Name Target Date Goal status  1 Pt will be able to resume driving with <6/46 pain in R ankle Baseline: has not resumed driving (eval) 8/0/3212   Not met  2 Pt will demo 5 deg of AROM of R ankle dorsiflexion to improve gait Baseline: 0 deg (eval); 15 deg 05/21/21 04/09/2021 Goal met   3 Pt will demo 10 deg of ankle inversion in R ankle to improve ability to pivot on R LE Baseline:2 deg (eval); 10 deg 05/07/21 04/09/2021 Goal met  4 Pt will be able to perform 5x sit to stand <12 sec without HHA to improve functional strength of LE Baseline: 15 sec (eval) 04/09/2021 Not assessed  5.  Patient will demo 15 deg of R ankle ivnersion ROM to improve ankle ROM on uneven surfaces 10/22/2021     Revised 08/13/21  6. Pt will report 60 deg of cervical flexion and 40 deg of cervical extension with <2/10 pain (50 deg flexion and 35 deg extension with 8/10 pain in neck) 10/22/2021   Initial    LONG TERM GOALS:    LTG Name Target Date Goal status  1 Pt will demo gait speed without AD that is >1 m/s to improve community ambulation Baseline:0.54ms with st. Cane (Eval); 0.961m without AD 06/20/21 02/12/2022     Revised 08/13/21  2 Pt will be able to perform 10 unilateral heel raises to improve strength of plantarflexors in R LE Baseline: pt  unable to perform standing unilateral heel raise (eva); 05/21/21: unable 05/21/21; 1 rep 06/20/21 02/12/2022  Revised 08/13/21  3 Pt will be able to ambulate 400 feet on grass without AD and without pain in ankle to improve community access Baseline: not attempted (eval); not attempted 05/21/21 02/12/2022  Revised 08/13/21  4.  Pt will report overall 50% reduction in her neck pain to improve self management 02/12/22 Initial  5.  Pt will report 15% improvement on NDI to improve self reported function 02/12/22 Initial       PLAN: PT FREQUENCY: 2x/month  PT DURATION:  10 sessions  PLANNED INTERVENTIONS: Therapeutic exercises, Therapeutic activity, Neuromuscular re-education, Balance training, Gait training, Patient/Family education, Joint manipulation, Joint mobilization, Vestibular training, Canalith repositioning, Electrical stimulation, Spinal mobilization, Cryotherapy, Moist heat, Traction, and Manual therapy  PLAN FOR NEXT SESSION: Continue to work on graded exposure. Goal was set with patient that by her next session in 2 weeks, she should be able to perform 2 x 10 of HEP given today. She should gradually increase 1 rep per day from 5 reps, for lateral flexion and extension.   KaKerrie PleasurePT 08/13/2021, 11:30 AM

## 2021-08-27 ENCOUNTER — Ambulatory Visit: Payer: 59

## 2021-08-27 DIAGNOSIS — R262 Difficulty in walking, not elsewhere classified: Secondary | ICD-10-CM | POA: Diagnosis not present

## 2021-08-27 DIAGNOSIS — R2681 Unsteadiness on feet: Secondary | ICD-10-CM

## 2021-08-27 DIAGNOSIS — M25571 Pain in right ankle and joints of right foot: Secondary | ICD-10-CM

## 2021-08-27 DIAGNOSIS — M542 Cervicalgia: Secondary | ICD-10-CM

## 2021-08-27 DIAGNOSIS — M25572 Pain in left ankle and joints of left foot: Secondary | ICD-10-CM

## 2021-08-27 NOTE — Therapy (Signed)
OUTPATIENT PHYSICAL THERAPY TREATMENT NOTE   Patient Name: Veronica Hunter MRN: [id number] DOB:07/13/1970, 51 y.o., female Today's Date: 08/27/2021   PT End of Session - 08/27/21 1055     Visit Number 21    Number of Visits 30    Date for PT Re-Evaluation 10/22/21    Authorization Type [employer]; recertification 08/13/32; 30 v max per year    Progress Note Due on Visit 30    PT Start Time 1015    PT Stop Time 1100    PT Time Calculation (min) 45 min    Activity Tolerance Patient tolerated treatment well    Behavior During Therapy WFL for tasks assessed/performed              Past Medical History:  Diagnosis Date   H/O fracture 2020   left foot, right ankle   Past Surgical History:  Procedure Laterality Date   [place]   right ankle fracture surgery  2020   2 plates/10 screws    right ankle tendon repair  2010   [place]  2009   Patient Active Problem List   Diagnosis Date Noted   Difficulty in walking, not elsewhere classified 05/21/2021   Pain in left ankle and joints of left foot 05/21/2021   Myofascial pain syndrome, cervical 02/12/2021   Dizziness 04/07/2020   Cervicalgia 04/07/2020    PCP: Dr. Irene Pap  REFERRING PROVIDER: Dr Sarina Ill  REFERRING DIAG:  [phone] (ICD-10-CM) - Chronic neck pain  M79.18 (ICD-10-CM) - Cervical myofascial pain syndrome  M54.2 (ICD-10-CM) - Cervicalgia    THERAPY DIAG:  Difficulty in walking, not elsewhere classified  Pain in left ankle and joints of left foot  Pain in right ankle and joints of right foot  Unsteadiness on feet  Cervicalgia  Rationale for Evaluation and Treatment Rehabilitation  ONSET DATE: 08/12/21 - date of referral  SUBJECTIVE:                                                                                                                                                                                                          SUBJECTIVE STATEMENT: Pt reports she had dry needling sensation on Friday and therapist hit the spot that caused all kinds of sensation down shoulders and both arms. She hasn't been able to sleep since Saturday of last week. She was keeping up with all the exercises until last Friday and each day she was feeling slightly aggravated. Pt reporting numbness in R middle finger and pinky and  ring fingers more, mildly in thumb.  PERTINENT HISTORY:  Hx of R ankle surgery  PAIN:  Are you having pain? Yes: NPRS scale: 8/10 Pain location: neck pain, shoulder pain Pain description: dull, ache, throbbing Aggravating factors: stress, lifting, sleeping,  housework Relieving factors: dry needling, heat, exercises.  PRECAUTIONS: None  WEIGHT BEARING RESTRICTIONS No  FALLS:  Has patient fallen in last 6 months? No  LIVING ENVIRONMENT: Lives with: lives with their spouse Lives in: House/apartment Stairs: No Has following equipment at home: None  OCCUPATION: retired.  PLOF: Independent  PATIENT GOALS reduce neck pain  OBJECTIVE:    PATIENT SURVEYS:  NDI 26/50- 52% disability   COGNITION: Overall cognitive status: Within functional limits for tasks assessed   SENSATION: Not tested  POSTURE: rounded shoulders and forward head   CERVICAL ROM:   Active ROM A/PROM 08/13/21 AROM 08/27/21  /Flexion 50 54  Extension/ 35 45  Right lateral flexion 25   Left lateral flexion 25   Right rotation 50 65  Left rotation 50 62   (Blank rows = not tested)      TODAY'S TREATMENT:  Pt educated on chronic pain and tissue sensitivity. We discussed more active approach with AROM and strengthening to manage pain vs passive treatments of stretching or manual therapy as we have trialed that in past without any long term success. Pt is willing to perform dry needling out of pocket. Movement with mobilization at unilateral on R C7-T1: with L cervical rotation and flexion- Pt reported improving  symptoms in R arm (proximal symptoms relieved) ; also performed at [facility] with cervical flexion (pt very sensitive)   Supine chin tuck with flexion:  x 10 R SL L lateral flexion of neck: 10 x L SL R lateral flexion of neck: 10 x Prone on elbows: 2 x 5 , pt was having some nausea Pt educated on do as many reps as she can in above positions and gradually work on increasing reps.  PATIENT EDUCATION:  Education details: see above Person educated: Patient Education method: Explanation Education comprehension: verbalized understanding   HOME EXERCISE PROGRAM: Access Code [id number]  ASSESSMENT:  CLINICAL IMPRESSION: With manual therapy, we were able to significantly reduce patient's sensory symptoms in her R arm and neck/shoulders. Pt was educated to continue to emphasize graded exposure to strengthening to improve tissue tolerance and decrease sensitivity and reduce chronic pain.   OBJECTIVE IMPAIRMENTS Abnormal gait, decreased activity tolerance, decreased balance, decreased endurance, decreased mobility, difficulty walking, decreased ROM, decreased strength, dizziness, hypomobility, increased fascial restrictions, increased muscle spasms, impaired flexibility, postural dysfunction, and pain.   ACTIVITY LIMITATIONS carrying, lifting, bending, standing, squatting, sleeping, and stairs  PARTICIPATION LIMITATIONS: meal prep, cleaning, laundry, driving, and yard work  PERSONAL FACTORS Past/current experiences and Time since onset of injury/illness/exacerbation are also affecting patient's functional outcome.   REHAB POTENTIAL: Good  CLINICAL DECISION MAKING: Stable/uncomplicated  EVALUATION COMPLEXITY: Low   GOALS: GOALS: Goals reviewed with patient? Yes   SHORT TERM GOALS:   STG Name Target Date Goal status  1 Pt will be able to resume driving with <1/15 pain in R ankle Baseline: has not resumed driving (eval) 09/07/6201   Not met  2 Pt will demo 5 deg of AROM of R ankle  dorsiflexion to improve gait Baseline: 0 deg (eval); 15 deg 05/21/21 04/09/2021 Goal met   3 Pt will demo 10 deg of ankle inversion in R ankle to improve ability to pivot on R LE Baseline:2 deg (eval); 10  deg 05/07/21 04/09/2021 Goal met  4 Pt will be able to perform 5x sit to stand <12 sec without HHA to improve functional strength of LE Baseline: 15 sec (eval) 04/09/2021 Not assessed  5.  Patient will demo 15 deg of R ankle ivnersion ROM to improve ankle ROM on uneven surfaces 10/22/2021     Revised 08/13/21  6. Pt will report 60 deg of cervical flexion and 40 deg of cervical extension with <2/10 pain (50 deg flexion and 35 deg extension with 8/10 pain in neck) 10/22/2021   Initial    LONG TERM GOALS:    LTG Name Target Date Goal status  1 Pt will demo gait speed without AD that is >1 m/s to improve community ambulation Baseline:0.85ms with st. Cane (Eval); 0.978m without AD 06/20/21 02/12/2022     Revised 08/13/21  2 Pt will be able to perform 10 unilateral heel raises to improve strength of plantarflexors in R LE Baseline: pt unable to perform standing unilateral heel raise (eva); 05/21/21: unable 05/21/21; 1 rep 06/20/21 02/12/2022  Revised 08/13/21  3 Pt will be able to ambulate 400 feet on grass without AD and without pain in ankle to improve community access Baseline: not attempted (eval); not attempted 05/21/21 02/12/2022  Revised 08/13/21  4.  Pt will report overall 50% reduction in her neck pain to improve self management 02/12/22 Initial  5.  Pt will report 15% improvement on NDI to improve self reported function 02/12/22 Initial       PLAN: PT FREQUENCY: 2x/month  PT DURATION:  10 sessions  PLANNED INTERVENTIONS: Therapeutic exercises, Therapeutic activity, Neuromuscular re-education, Balance training, Gait training, Patient/Family education, Joint manipulation, Joint mobilization, Vestibular training, Canalith repositioning, Electrical stimulation, Spinal mobilization, Cryotherapy, Moist  heat, Traction, and Manual therapy  PLAN FOR NEXT SESSION: Continue to work on graded exposure. Goal was set with patient that by her next session in 2 weeks, she should be able to perform 2 x 10 of HEP given today. She should gradually increase 1 rep per day from 5 reps, for lateral flexion and extension.   KaKerrie PleasurePT 08/27/2021, 10:57 AM

## 2021-09-12 ENCOUNTER — Ambulatory Visit: Payer: 59

## 2021-09-13 IMAGING — CT CT CARDIAC CORONARY ARTERY CALCIUM SCORE
3 series · 14 of 20 positions shown, 16 images · non-contrast
Comparison: None.

CLINICAL DATA: Atherosclerosis

EXAM:
CT CARDIAC CORONARY ARTERY CALCIUM SCORE
TECHNIQUE: Non-contrast imaging through the heart was performed using
prospective ECG gating. Image post processing was performed on an
independent workstation, allowing for quantitative analysis of the
heart and coronary arteries. Note that this exam targets the heart
and the chest was not imaged in its entirety.

[Series 2: calcium scoring 2.00 qr36 bestdiast 70% hrt calciu · axial · 0.41mm/px · z∈[+1687,+1771]mm · 4 of 70 slices shown]
[im 14/70  vessel]
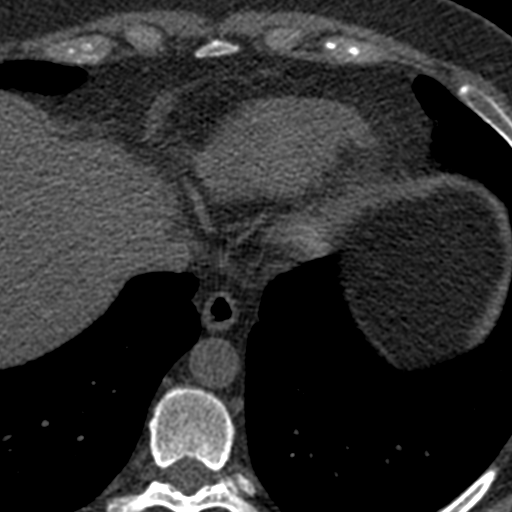
[im 28/70  vessel]
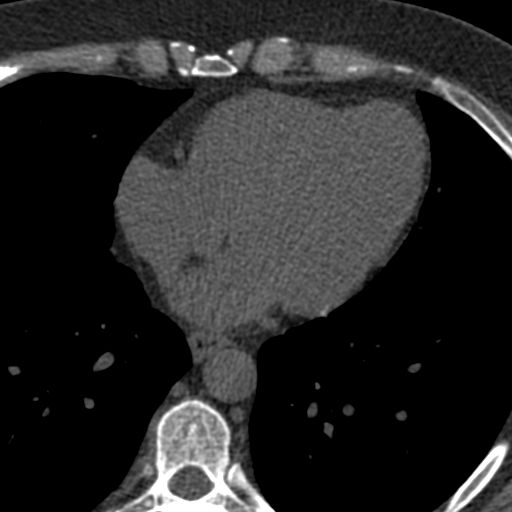
[im 42/70  vessel]
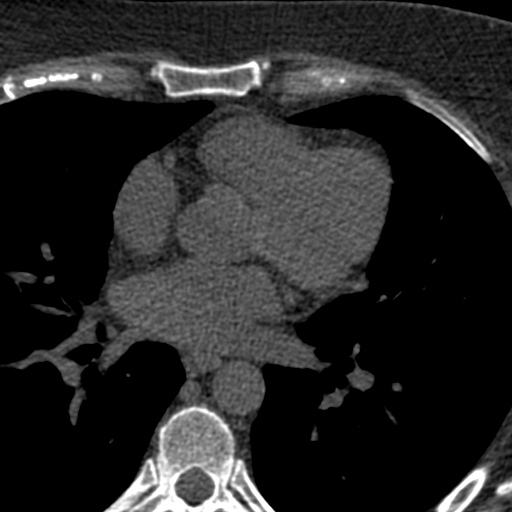
[im 56/70  vessel]
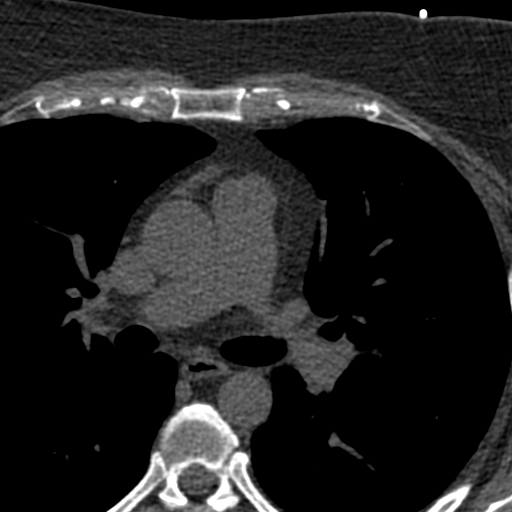

[Series 3: calcium scoring 2.00 br40 bestdiast 70% axial · axial · 0.61mm/px · z∈[+1683,+1775]mm · 5 of 70 slices shown, 7 images]
[im 12/70  vessel]
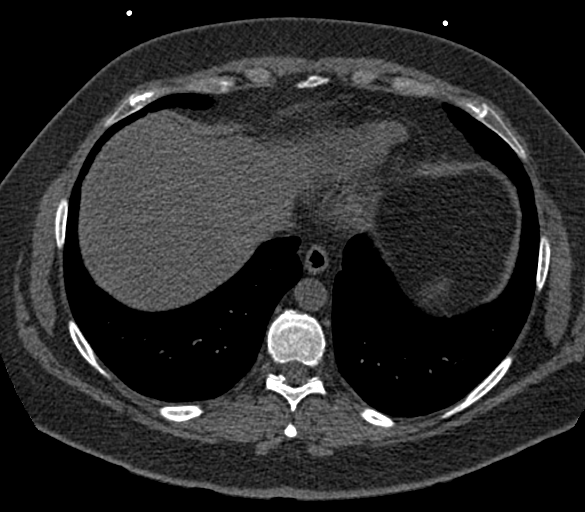
[im 12/70  lung]
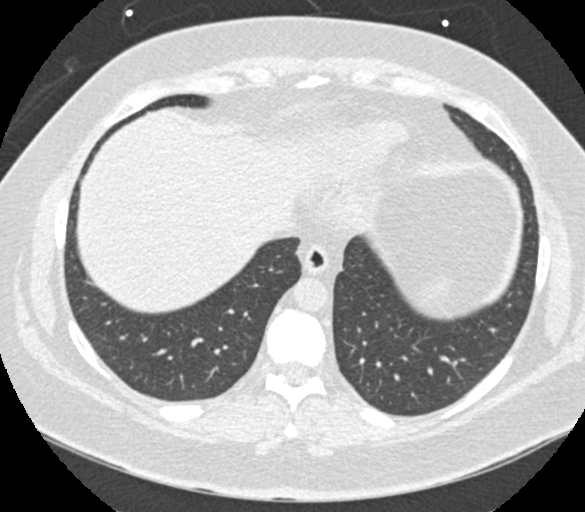
[im 24/70  vessel]
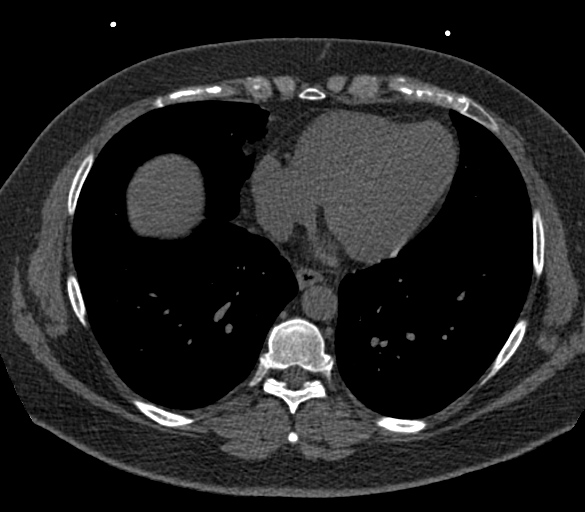
[im 35/70  vessel]
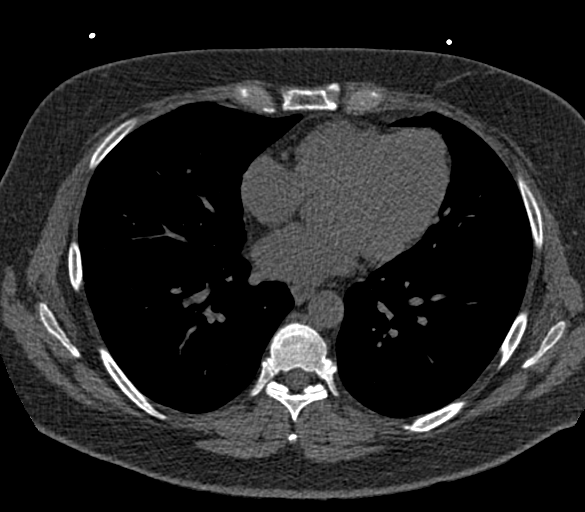
[im 47/70  vessel]
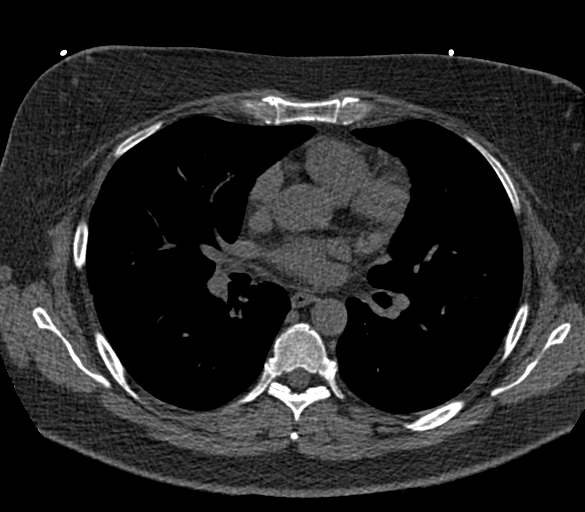
[im 58/70  vessel]
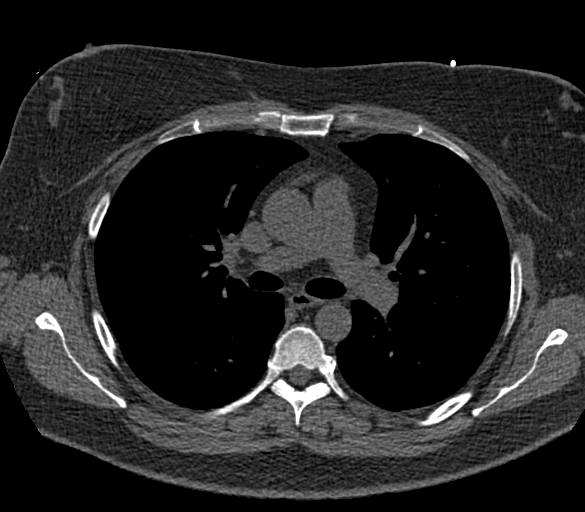
[im 58/70  lung]
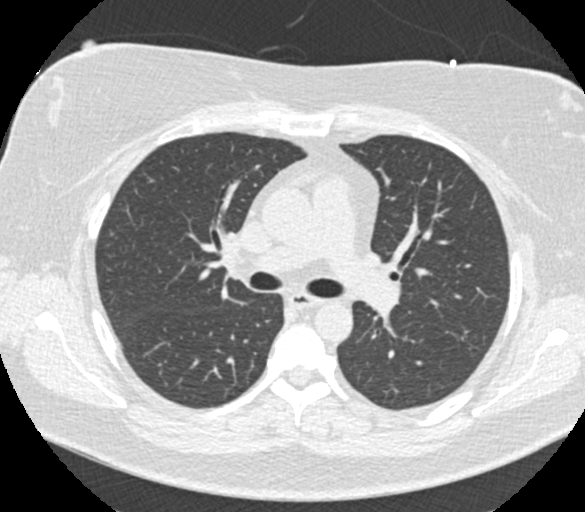

[Series 9: calcium scoring 2.00 br60 bestdiast 70% lungs · axial · 0.60mm/px · z∈[+1683,+1775]mm · 5 of 70 slices shown]
[im 12/70  vessel]
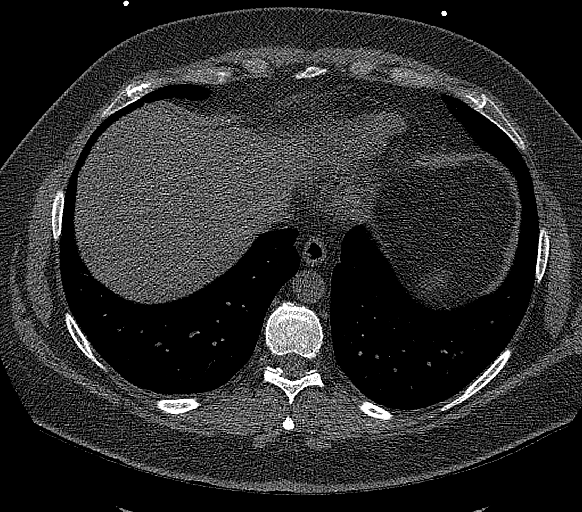
[im 24/70  vessel]
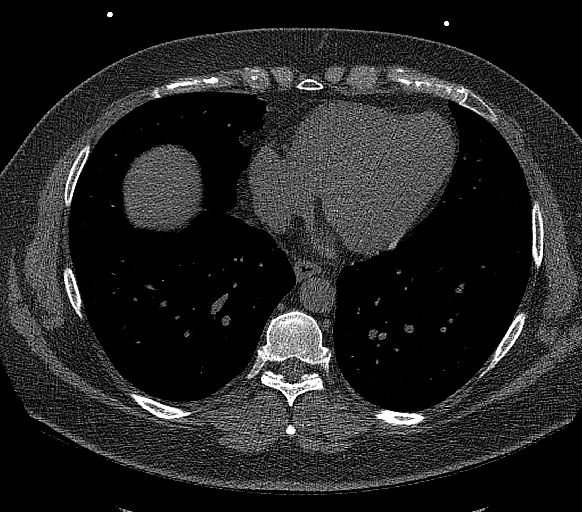
[im 35/70  vessel]
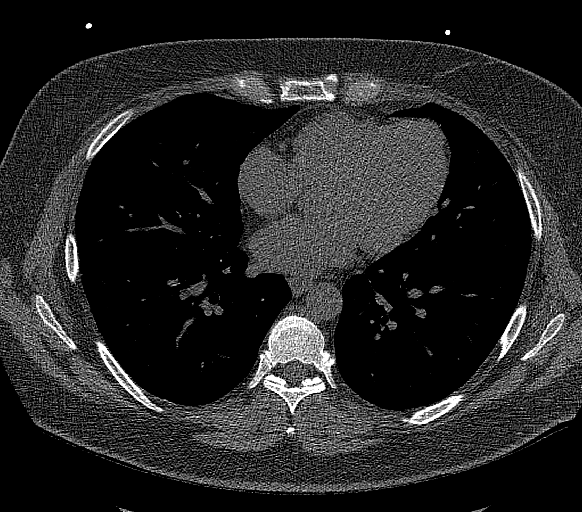
[im 47/70  vessel]
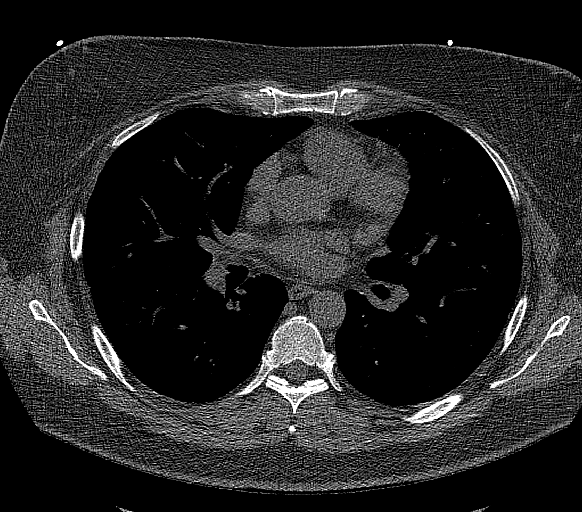
[im 58/70  vessel]
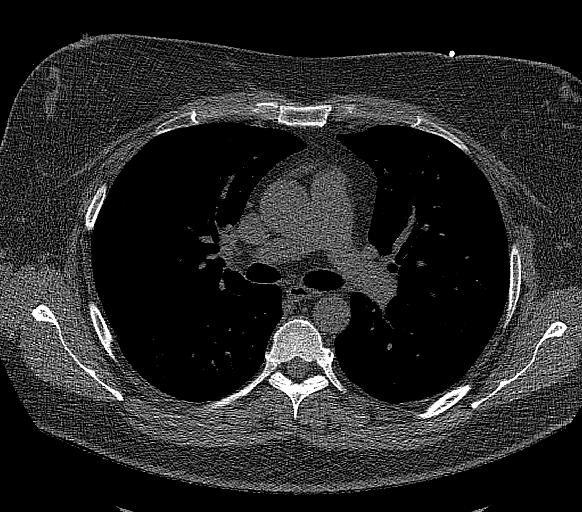

[14 of 20 positions shown; findings below may reference images not displayed]

FINDINGS: CORONARY CALCIUM SCORES:

Left Main: 0

LAD: 0

LCx: 0

RCA: 0

Total Agatston Score: 0

[HOSPITAL] percentile: 0

AORTA MEASUREMENTS:

Ascending Aorta: 30 mm

Descending Aorta: 22 mm

OTHER FINDINGS:

Heart is normal size. Aorta normal caliber. A few scattered
calcifications in the distal aortic arch. No adenopathy. No
confluent opacities or effusions. Imaging into the upper abdomen
demonstrates no acute findings. Chest wall soft tissues are
unremarkable. No acute bony abnormality.
IMPRESSION: No visible coronary artery calcifications. Total coronary calcium
score of 0.

Punctate calcifications in the distal aortic arch.

No acute extra cardiac abnormality.

## 2021-09-17 ENCOUNTER — Ambulatory Visit: Payer: 59 | Attending: Family Medicine

## 2021-09-17 DIAGNOSIS — M542 Cervicalgia: Secondary | ICD-10-CM | POA: Insufficient documentation

## 2021-09-17 DIAGNOSIS — R262 Difficulty in walking, not elsewhere classified: Secondary | ICD-10-CM | POA: Insufficient documentation

## 2021-09-17 DIAGNOSIS — M25571 Pain in right ankle and joints of right foot: Secondary | ICD-10-CM | POA: Insufficient documentation

## 2021-09-17 DIAGNOSIS — R2681 Unsteadiness on feet: Secondary | ICD-10-CM | POA: Diagnosis present

## 2021-09-17 DIAGNOSIS — M25572 Pain in left ankle and joints of left foot: Secondary | ICD-10-CM | POA: Insufficient documentation

## 2021-09-17 NOTE — Therapy (Signed)
OUTPATIENT PHYSICAL THERAPY TREATMENT NOTE   Patient Name: Veronica Hunter MRN: [id number] DOB:[birth year], 51 y.o., female Today's Date: 09/17/2021   PT End of Session - 09/17/21 1023     Visit Number 22    Number of Visits 30    Date for PT Re-Evaluation 10/22/21    Authorization Type [employer]; recertification 11/13/26; 30 v max per year    PT Start Time 1015    PT Stop Time 1100    PT Time Calculation (min) 45 min    Activity Tolerance Patient tolerated treatment well    Behavior During Therapy [facility] for tasks assessed/performed              Past Medical History:  Diagnosis Date   H/O fracture 2020   left foot, right ankle   Past Surgical History:  Procedure Laterality Date   [place]   right ankle fracture surgery  2020   2 plates/10 screws    right ankle tendon repair  2010   [place]  2009   Patient Active Problem List   Diagnosis Date Noted   Difficulty in walking, not elsewhere classified 05/21/2021   Pain in left ankle and joints of left foot 05/21/2021   Myofascial pain syndrome, cervical 02/12/2021   Dizziness 04/07/2020   Cervicalgia 04/07/2020    PCP: Dr. Irene Pap  REFERRING PROVIDER: Dr Sarina Ill  REFERRING DIAG:  [phone] (ICD-10-CM) - Chronic neck pain  M79.18 (ICD-10-CM) - Cervical myofascial pain syndrome  M54.2 (ICD-10-CM) - Cervicalgia    THERAPY DIAG:  Difficulty in walking, not elsewhere classified  Pain in left ankle and joints of left foot  Pain in right ankle and joints of right foot  Unsteadiness on feet  Cervicalgia  Rationale for Evaluation and Treatment Rehabilitation  ONSET DATE: 08/12/21 - date of referral  SUBJECTIVE:                                                                                                                                                                                                         SUBJECTIVE STATEMENT: Pt reports that  she has GI and acid reflux issues. She never took anything for it but then 2 weeks ago, she decided to take pepcid and felt relief in her mid back pain/rib pain and R hand numbness/pain for 3 days straight with consistent taking pepcid for 3 days She has also started Viome supplements and changed her diet for past week and not taking pepcid which has added additional improvements in her  symptoms.   PERTINENT HISTORY:  Hx of R ankle surgery  PAIN:  Are you having pain? Yes: NPRS scale: 8/10 Pain location: neck pain, shoulder pain Pain description: dull, ache, throbbing Aggravating factors: stress, lifting, sleeping,  housework Relieving factors: dry needling, heat, exercises.  PRECAUTIONS: None  WEIGHT BEARING RESTRICTIONS No  FALLS:  Has patient fallen in last 6 months? No  LIVING ENVIRONMENT: Lives with: lives with their spouse Lives in: House/apartment Stairs: No Has following equipment at home: None  OCCUPATION: retired.  PLOF: Independent  PATIENT GOALS reduce neck pain  OBJECTIVE:    PATIENT SURVEYS:  NDI 26/50- 52% disability   COGNITION: Overall cognitive status: Within functional limits for tasks assessed   SENSATION: Not tested  POSTURE: rounded shoulders and forward head   CERVICAL ROM:   Active ROM A/PROM 08/13/21 AROM 08/27/21  /Flexion 50 54  Extension/ 35 45  Right lateral flexion 25   Left lateral flexion 25   Right rotation 50 65  Left rotation 50 62   (Blank rows = not tested)      TODAY'S TREATMENT:  Seated cervical flexion, lateral flexion, retraction: yellow band: 10x Educated patient that she needs to continue with supine exercises and potetnailly inrease reps to 15x from 10x. Then add seated resistive band exercises and increase her frequency and intensity as tolerated.  Pt educated that while she is taking her supplements and changing her diet, she is feeling less pain and we need to utilize this time to gradually increase her  conditioning with strengthening of her neck and shoulder muscles to have longer term pain relief. Pt was educated on using yellow band properly. Make sure band is slightly taut at start of the movement (ant not overally stretched) to make sure she uses yellow band resistance appropriately. Pt was educated on changing intensity and frequency of exercises appropriately to modify her pain level and tolerance.   PATIENT EDUCATION:  Education details: see above Person educated: Patient Education method: Explanation Education comprehension: verbalized understanding   HOME EXERCISE PROGRAM: Access Code [id number]  ASSESSMENT:  CLINICAL IMPRESSION: Pt is reporting continued compliance with HEP. Pt is reporting further changes in her pain with her recent supplement changes and diet. Pt needs continued education to improve her understanding of gradual graded exposure to improve strength of her neck muscles and shoulders to improve long term pain management. Pt has not utilized dry needling in 2 weeks, indicating improivng pain management.   OBJECTIVE IMPAIRMENTS Abnormal gait, decreased activity tolerance, decreased balance, decreased endurance, decreased mobility, difficulty walking, decreased ROM, decreased strength, dizziness, hypomobility, increased fascial restrictions, increased muscle spasms, impaired flexibility, postural dysfunction, and pain.   ACTIVITY LIMITATIONS carrying, lifting, bending, standing, squatting, sleeping, and stairs  PARTICIPATION LIMITATIONS: meal prep, cleaning, laundry, driving, and yard work  PERSONAL FACTORS Past/current experiences and Time since onset of injury/illness/exacerbation are also affecting patient's functional outcome.   REHAB POTENTIAL: Good  CLINICAL DECISION MAKING: Stable/uncomplicated  EVALUATION COMPLEXITY: Low   GOALS: GOALS: Goals reviewed with patient? Yes   SHORT TERM GOALS:   STG Name Target Date Goal status  1 Pt will be able to  resume driving with <0/62 pain in R ankle Baseline: has not resumed driving (eval) 05/13/6281   Not met  2 Pt will demo 5 deg of AROM of R ankle dorsiflexion to improve gait Baseline: 0 deg (eval); 15 deg 05/21/21 04/09/2021 Goal met   3 Pt will demo 10 deg of ankle inversion in R ankle to  improve ability to pivot on R LE Baseline:2 deg (eval); 10 deg 05/07/21 04/09/2021 Goal met  4 Pt will be able to perform 5x sit to stand <12 sec without HHA to improve functional strength of LE Baseline: 15 sec (eval) 04/09/2021 Not assessed  5.  Patient will demo 15 deg of R ankle ivnersion ROM to improve ankle ROM on uneven surfaces 10/22/2021     Revised 08/13/21  6. Pt will report 60 deg of cervical flexion and 40 deg of cervical extension with <2/10 pain (50 deg flexion and 35 deg extension with 8/10 pain in neck) 10/22/2021   Initial    LONG TERM GOALS:    LTG Name Target Date Goal status  1 Pt will demo gait speed without AD that is >1 m/s to improve community ambulation Baseline:0.11ms with st. Cane (Eval); 0.951m without AD 06/20/21 02/12/2022     Revised 08/13/21  2 Pt will be able to perform 10 unilateral heel raises to improve strength of plantarflexors in R LE Baseline: pt unable to perform standing unilateral heel raise (eva); 05/21/21: unable 05/21/21; 1 rep 06/20/21 02/12/2022  Revised 08/13/21  3 Pt will be able to ambulate 400 feet on grass without AD and without pain in ankle to improve community access Baseline: not attempted (eval); not attempted 05/21/21 02/12/2022  Revised 08/13/21  4.  Pt will report overall 50% reduction in her neck pain to improve self management 02/12/22 Initial  5.  Pt will report 15% improvement on NDI to improve self reported function 02/12/22 Initial       PLAN: PT FREQUENCY: 2x/month  PT DURATION:  10 sessions  PLANNED INTERVENTIONS: Therapeutic exercises, Therapeutic activity, Neuromuscular re-education, Balance training, Gait training, Patient/Family education,  Joint manipulation, Joint mobilization, Vestibular training, Canalith repositioning, Electrical stimulation, Spinal mobilization, Cryotherapy, Moist heat, Traction, and Manual therapy  PLAN FOR NEXT SESSION: Continue to work on graded exposure. Goal was set with patient that by her next session in 2 weeks, she should be able to perform 2 x 10 of HEP given today. She should gradually increase 1 rep per day from 5 reps, for lateral flexion and extension.   KaKerrie PleasurePT 09/17/2021, 10:24 AM

## 2021-10-01 ENCOUNTER — Ambulatory Visit: Payer: 59

## 2021-10-01 DIAGNOSIS — M25571 Pain in right ankle and joints of right foot: Secondary | ICD-10-CM

## 2021-10-01 DIAGNOSIS — M542 Cervicalgia: Secondary | ICD-10-CM

## 2021-10-01 DIAGNOSIS — R262 Difficulty in walking, not elsewhere classified: Secondary | ICD-10-CM | POA: Diagnosis not present

## 2021-10-01 DIAGNOSIS — M25572 Pain in left ankle and joints of left foot: Secondary | ICD-10-CM

## 2021-10-01 NOTE — Therapy (Signed)
OUTPATIENT PHYSICAL THERAPY TREATMENT NOTE   Patient Name: Veronica Hunter MRN: [id number] DOB:09/03/1970, 51 y.o., female Today's Date: 10/01/2021   PT End of Session - 10/01/21 0940     Visit Number 23    Number of Visits 30    Date for PT Re-Evaluation 10/22/21    Authorization Type [employer]; recertification 08/08/84; 30 v max per year    PT Start Time 0940    PT Stop Time 1020    PT Time Calculation (min) 40 min    Activity Tolerance Patient tolerated treatment well    Behavior During Therapy [facility] for tasks assessed/performed               Past Medical History:  Diagnosis Date   H/O fracture 2020   left foot, right ankle   Past Surgical History:  Procedure Laterality Date   GALLBLADDER SURGERY  1996   right ankle fracture surgery  2020   2 plates/10 screws    right ankle tendon repair  2010   [place]  2009   Patient Active Problem List   Diagnosis Date Noted   Difficulty in walking, not elsewhere classified 05/21/2021   Pain in left ankle and joints of left foot 05/21/2021   Myofascial pain syndrome, cervical 02/12/2021   Dizziness 04/07/2020   Cervicalgia 04/07/2020    PCP: Dr. Irene Pap  REFERRING PROVIDER: Dr Sarina Ill  REFERRING DIAG:  [phone] (ICD-10-CM) - Chronic neck pain  M79.18 (ICD-10-CM) - Cervical myofascial pain syndrome  M54.2 (ICD-10-CM) - Cervicalgia    THERAPY DIAG:  Difficulty in walking, not elsewhere classified  Pain in left ankle and joints of left foot  Cervicalgia  Pain in right ankle and joints of right foot  Rationale for Evaluation and Treatment Rehabilitation  ONSET DATE: 08/12/21 - date of referral  SUBJECTIVE:                                                                                                                                                                                                         SUBJECTIVE STATEMENT: She slept on different mattress while  she was at her family's house that made her arm symptoms feel much better but she was feeling more dizzy symptoms. When she came back home, she felt arm symptoms sleeping in her mattress again.  PERTINENT HISTORY:  Hx of R ankle surgery  PAIN:  Are you having pain? Yes: NPRS scale: 4/10 Pain location: neck pain, shoulder pain Pain description: dull, ache, throbbing Aggravating factors: stress, lifting, sleeping,  housework Relieving factors: dry needling, heat, exercises.  PRECAUTIONS: None  WEIGHT BEARING RESTRICTIONS No  FALLS:  Has patient fallen in last 6 months? No  LIVING ENVIRONMENT: Lives with: lives with their spouse Lives in: House/apartment Stairs: No Has following equipment at home: None  OCCUPATION: retired.  PLOF: Independent  PATIENT GOALS reduce neck pain  OBJECTIVE:    PATIENT SURVEYS:  NDI 26/50- 52% disability   COGNITION: Overall cognitive status: Within functional limits for tasks assessed   SENSATION: Not tested  POSTURE: rounded shoulders and forward head   CERVICAL ROM:   Active ROM A/PROM 08/13/21 AROM 08/27/21  /Flexion 50 54  Extension/ 35 45  Right lateral flexion 25   Left lateral flexion 25   Right rotation 50 65  Left rotation 50 62   (Blank rows = not tested)      TODAY'S TREATMENT:    Pt reporting radicular symptoms in R UE at start of the session Pt asked to put her arm over her head resting her forearm, she felt immediate relief in her distal arm. Keeping it longer there brought the symptoms back on. STM to clavicular portion of R pec major along with myofascial release of inferior clavicular border Horizontlaly abducting arm at 45 deg provokes patient's UE symptoms. Tried nerve flossing with R horizontal abduction with elbow slightly bent, pt reported sharp pain of Left side of neck while performing which required us to take break. Performed radial nerve flossing seated: 30x Performed radial nerve flossing  standing: 20x, when incoroporated head movements with it, pt reported dizziness so patient asked to avoid head movements with it and just focus on arm movements. Had patient download Med[employer] app and reviewed with patient on how to use it to continue to improve compliance with it.    PATIENT EDUCATION:  Education details: see above Person educated: Patient Education method: Explanation Education comprehension: verbalized understanding   HOME EXERCISE PROGRAM: Access Code [email or web address]  ASSESSMENT:  CLINICAL IMPRESSION: Pt did not like theraband exercises so she hasn't done them. Pt reports fair compliance with HEP as she does some of them every day but is not able to complete them all every day. Pt able to provoke R UE symptoms with nerve tensioning so today's session was focused on educating patient onhow to do nerve glides and why to do nerve glides.   OBJECTIVE IMPAIRMENTS Abnormal gait, decreased activity tolerance, decreased balance, decreased endurance, decreased mobility, difficulty walking, decreased ROM, decreased strength, dizziness, hypomobility, increased fascial restrictions, increased muscle spasms, impaired flexibility, postural dysfunction, and pain.   ACTIVITY LIMITATIONS carrying, lifting, bending, standing, squatting, sleeping, and stairs  PARTICIPATION LIMITATIONS: meal prep, cleaning, laundry, driving, and yard work  PERSONAL FACTORS Past/current experiences and Time since onset of injury/illness/exacerbation are also affecting patient's functional outcome.   REHAB POTENTIAL: Good  CLINICAL DECISION MAKING: Stable/uncomplicated  EVALUATION COMPLEXITY: Low   GOALS: GOALS: Goals reviewed with patient? Yes   SHORT TERM GOALS:   STG Name Target Date Goal status  1 Pt will be able to resume driving with <2/10 pain in R ankle Baseline: has not resumed driving (eval) 04/09/2021   Not met  2 Pt will demo 5 deg of AROM of R ankle dorsiflexion to improve  gait Baseline: 0 deg (eval); 15 deg 05/21/21 04/09/2021 Goal met   3 Pt will demo 10 deg of ankle inversion in R ankle to improve ability to pivot on R LE Baseline:2 deg (eval); 10 deg 05/07/21 04/09/2021 Goal met  4   Pt will be able to perform 5x sit to stand <12 sec without HHA to improve functional strength of LE Baseline: 15 sec (eval) 04/09/2021 Not assessed  5.  Patient will demo 15 deg of R ankle ivnersion ROM to improve ankle ROM on uneven surfaces 10/22/2021     Revised 08/13/21  6. Pt will report 60 deg of cervical flexion and 40 deg of cervical extension with <2/10 pain (50 deg flexion and 35 deg extension with 8/10 pain in neck) 10/22/2021   Initial    LONG TERM GOALS:    LTG Name Target Date Goal status  1 Pt will demo gait speed without AD that is >1 m/s to improve community ambulation Baseline:0.75ms with st. Cane (Eval); 0.948m without AD 06/20/21 02/12/2022     Revised 08/13/21  2 Pt will be able to perform 10 unilateral heel raises to improve strength of plantarflexors in R LE Baseline: pt unable to perform standing unilateral heel raise (eva); 05/21/21: unable 05/21/21; 1 rep 06/20/21 02/12/2022  Revised 08/13/21  3 Pt will be able to ambulate 400 feet on grass without AD and without pain in ankle to improve community access Baseline: not attempted (eval); not attempted 05/21/21 02/12/2022  Revised 08/13/21  4.  Pt will report overall 50% reduction in her neck pain to improve self management 02/12/22 Initial  5.  Pt will report 15% improvement on NDI to improve self reported function 02/12/22 Initial       PLAN: PT FREQUENCY: 2x/month  PT DURATION:  10 sessions  PLANNED INTERVENTIONS: Therapeutic exercises, Therapeutic activity, Neuromuscular re-education, Balance training, Gait training, Patient/Family education, Joint manipulation, Joint mobilization, Vestibular training, Canalith repositioning, Electrical stimulation, Spinal mobilization, Cryotherapy, Moist heat, Traction, and  Manual therapy  PLAN FOR NEXT SESSION: Continue to work on graded exposure. Goal was set with patient that by her next session in 2 weeks, she should be able to perform 2 x 10 of HEP given today. She should gradually increase 1 rep per day from 5 reps, for lateral flexion and extension.   KaKerrie PleasurePT 10/01/2021, 12:03 PM

## 2021-10-15 ENCOUNTER — Ambulatory Visit: Payer: 59

## 2021-10-29 ENCOUNTER — Ambulatory Visit: Payer: 59 | Attending: Family Medicine

## 2021-10-29 DIAGNOSIS — R262 Difficulty in walking, not elsewhere classified: Secondary | ICD-10-CM | POA: Diagnosis present

## 2021-10-29 DIAGNOSIS — M542 Cervicalgia: Secondary | ICD-10-CM | POA: Insufficient documentation

## 2021-10-29 DIAGNOSIS — M25572 Pain in left ankle and joints of left foot: Secondary | ICD-10-CM | POA: Insufficient documentation

## 2021-10-29 NOTE — Therapy (Signed)
OUTPATIENT PHYSICAL THERAPY TREATMENT NOTE   Patient Name: Veronica Hunter MRN: [id number] DOB:04-09-1970, 51 y.o., female Today's Date: 10/29/2021   PT End of Session - 10/29/21 0932     Visit Number 24    Number of Visits 30    Date for PT Re-Evaluation 10/22/21    Authorization Type [employer]; recertification 06/12/48; 30 v max per year    PT Start Time 0935    PT Stop Time 1015    PT Time Calculation (min) 40 min    Activity Tolerance Patient tolerated treatment well    Behavior During Therapy [facility] for tasks assessed/performed               Past Medical History:  Diagnosis Date   H/O fracture 2020   left foot, right ankle   Past Surgical History:  Procedure Laterality Date   [place]   right ankle fracture surgery  2020   2 plates/10 screws    right ankle tendon repair  2010   [place]  2009   Patient Active Problem List   Diagnosis Date Noted   Difficulty in walking, not elsewhere classified 05/21/2021   Pain in left ankle and joints of left foot 05/21/2021   Myofascial pain syndrome, cervical 02/12/2021   Dizziness 04/07/2020   Cervicalgia 04/07/2020    PCP: Dr. Irene Pap  REFERRING PROVIDER: Dr Sarina Ill  REFERRING DIAG:  [phone] (ICD-10-CM) - Chronic neck pain  M79.18 (ICD-10-CM) - Cervical myofascial pain syndrome  M54.2 (ICD-10-CM) - Cervicalgia    THERAPY DIAG:  Difficulty in walking, not elsewhere classified  Pain in left ankle and joints of left foot  Cervicalgia  Rationale for Evaluation and Treatment Rehabilitation  ONSET DATE: 08/12/21 - date of referral  SUBJECTIVE:                                                                                                                                                                                                         SUBJECTIVE STATEMENT: Pt is seeing orthopedic MD and got cortisone shot in both shoulders and trigger point  injection in R upper trap/levator area. That has helped somewhat. Pt reports that ROM in shoulders and neck has improved. Sensation in fingers and hands has gotten better to minimal. I am able to sleep throught the night better. When I carry bag on my shoulder, it creates pulsing in finger after carrying it a bag for a while. Pt is now able to sleep on side for 30 min before hands  get really numb and she has to turn.  PERTINENT HISTORY:  Hx of R ankle surgery  PAIN:  Are you having pain? Yes: NPRS scale: 9-10/10 Pain location: mid to lower back Pain description: dull, ache, throbbing Aggravating factors: stress, lifting, sleeping,  housework Relieving factors: dry needling, heat, exercises.  PRECAUTIONS: None  WEIGHT BEARING RESTRICTIONS No  FALLS:  Has patient fallen in last 6 months? No  LIVING ENVIRONMENT: Lives with: lives with their spouse Lives in: House/apartment Stairs: No Has following equipment at home: None  OCCUPATION: retired.  PLOF: Independent  PATIENT GOALS reduce neck pain  OBJECTIVE:    PATIENT SURVEYS:  NDI 26/50- 52% disability   COGNITION: Overall cognitive status: Within functional limits for tasks assessed   SENSATION: Not tested  POSTURE: rounded shoulders and forward head   CERVICAL ROM:   Active ROM A/PROM 08/13/21 AROM 08/27/21  /Flexion 50 54  Extension/ 35 45  Right lateral flexion 25   Left lateral flexion 25   Right rotation 50 65  Left rotation 50 62   (Blank rows = not tested)      TODAY'S TREATMENT:    Manual therapy: myofascial release to upper thoracic spine,mid thoracic spine and lower thoracic spine    PATIENT EDUCATION:  Education details: see above Person educated: Patient Education method: Explanation Education comprehension: verbalized understanding   HOME EXERCISE PROGRAM: Access Code [email or web address]  ASSESSMENT:  CLINICAL IMPRESSION: Pt reported pain in her mid to lower back drop down from 9-10/10 to  6/10 at end of the session.   OBJECTIVE IMPAIRMENTS Abnormal gait, decreased activity tolerance, decreased balance, decreased endurance, decreased mobility, difficulty walking, decreased ROM, decreased strength, dizziness, hypomobility, increased fascial restrictions, increased muscle spasms, impaired flexibility, postural dysfunction, and pain.   ACTIVITY LIMITATIONS carrying, lifting, bending, standing, squatting, sleeping, and stairs  PARTICIPATION LIMITATIONS: meal prep, cleaning, laundry, driving, and yard work  PERSONAL FACTORS Past/current experiences and Time since onset of injury/illness/exacerbation are also affecting patient's functional outcome.   REHAB POTENTIAL: Good  CLINICAL DECISION MAKING: Stable/uncomplicated  EVALUATION COMPLEXITY: Low   GOALS: GOALS: Goals reviewed with patient? Yes   SHORT TERM GOALS:   STG Name Target Date Goal status  1 Pt will be able to resume driving with <2/24 pain in R ankle Baseline: has not resumed driving (eval) 06/15/7528   Not met  2 Pt will demo 5 deg of AROM of R ankle dorsiflexion to improve gait Baseline: 0 deg (eval); 15 deg 05/21/21 04/09/2021 Goal met   3 Pt will demo 10 deg of ankle inversion in R ankle to improve ability to pivot on R LE Baseline:2 deg (eval); 10 deg 05/07/21 04/09/2021 Goal met  4 Pt will be able to perform 5x sit to stand <12 sec without HHA to improve functional strength of LE Baseline: 15 sec (eval) 04/09/2021 Not assessed  5.  Patient will demo 15 deg of R ankle ivnersion ROM to improve ankle ROM on uneven surfaces 10/22/2021     Revised 08/13/21  6. Pt will report 60 deg of cervical flexion and 40 deg of cervical extension with <2/10 pain (50 deg flexion and 35 deg extension with 8/10 pain in neck) 10/22/2021   Initial    LONG TERM GOALS:    LTG Name Target Date Goal status  1 Pt will demo gait speed without AD that is >1 m/s to improve community ambulation Baseline:0.50ms with st. Cane (Eval); 0.963m  without AD 06/20/21 02/12/2022     Revised 08/13/21  2 Pt will be able to perform 10 unilateral heel raises to improve strength of plantarflexors in R LE Baseline: pt unable to perform standing unilateral heel raise (eva); 05/21/21: unable 05/21/21; 1 rep 06/20/21 02/12/2022  Revised 08/13/21  3 Pt will be able to ambulate 400 feet on grass without AD and without pain in ankle to improve community access Baseline: not attempted (eval); not attempted 05/21/21 02/12/2022  Revised 08/13/21  4.  Pt will report overall 50% reduction in her neck pain to improve self management 02/12/22 Initial  5.  Pt will report 15% improvement on NDI to improve self reported function 02/12/22 Initial       PLAN: PT FREQUENCY: 2x/month  PT DURATION:  10 sessions  PLANNED INTERVENTIONS: Therapeutic exercises, Therapeutic activity, Neuromuscular re-education, Balance training, Gait training, Patient/Family education, Joint manipulation, Joint mobilization, Vestibular training, Canalith repositioning, Electrical stimulation, Spinal mobilization, Cryotherapy, Moist heat, Traction, and Manual therapy  PLAN FOR NEXT SESSION: Continue to work on graded exposure. Goal was set with patient that by her next session in 2 weeks, she should be able to perform 2 x 10 of HEP given today. She should gradually increase 1 rep per day from 5 reps, for lateral flexion and extension.   Kerrie Pleasure, PT 10/29/2021, 10:13 AM

## 2021-10-30 ENCOUNTER — Encounter (HOSPITAL_BASED_OUTPATIENT_CLINIC_OR_DEPARTMENT_OTHER): Payer: Self-pay

## 2021-10-30 ENCOUNTER — Emergency Department (HOSPITAL_BASED_OUTPATIENT_CLINIC_OR_DEPARTMENT_OTHER)
Admission: EM | Admit: 2021-10-30 | Discharge: 2021-10-31 | Disposition: A | Discharge status: Home / Self Care | Payer: 59 | Attending: Emergency Medicine | Admitting: Emergency Medicine

## 2021-10-30 ENCOUNTER — Other Ambulatory Visit: Payer: Self-pay

## 2021-10-30 DIAGNOSIS — B029 Zoster without complications: Secondary | ICD-10-CM | POA: Diagnosis not present

## 2021-10-30 DIAGNOSIS — R21 Rash and other nonspecific skin eruption: Secondary | ICD-10-CM | POA: Diagnosis present

## 2021-10-30 MED ORDER — VALACYCLOVIR HCL 1 G PO TABS: 1000.0000 mg | ORAL_TABLET | Freq: Three times a day (TID) | ORAL | 0 refills | Status: AC

## 2021-10-30 MED ORDER — SENNOSIDES-DOCUSATE SODIUM 8.6-50 MG PO TABS: 1.0000 | ORAL_TABLET | Freq: Every evening | ORAL | 0 refills | Status: DC | PRN

## 2021-10-30 MED ORDER — OXYCODONE-ACETAMINOPHEN 5-325 MG PO TABS: 1.0000 | ORAL_TABLET | Freq: Four times a day (QID) | ORAL | 0 refills | Status: DC | PRN

## 2021-10-30 NOTE — ED Provider Notes (Signed)
Emergency Department Provider Note   I have reviewed the triage vital signs and the nursing notes.   HISTORY  Chief Complaint Rash   HPI Veronica Hunter is a 51 y.o. female presents emergency department valuation of left flank pain and now developing rash.  Patient states that over the past week she has had a bandlike "nerve" pain which she initially attributed to muscle strain.  She had an ED visit in [place] with CT imaging for renal stone and labs which were all reassuring.  She been taking NSAIDs and muscle relaxer with some improvement.  She went to her physical therapy and symptoms seem to improve slightly.  She had worsening of the bandlike pain and this morning developed a rash.  Describes the rash as blistering.  No fevers.  No rash to the face or around the eyes.  She presents here for evaluation.   Past Medical History:  Diagnosis Date   H/O fracture 2020   left foot, right ankle    Review of Systems  Constitutional: No fever/chills Cardiovascular: Denies chest pain. Respiratory: Denies shortness of breath. Gastrointestinal: No abdominal pain.  Genitourinary: Negative for dysuria. Musculoskeletal: Positive for back pain. Skin: Painful rash.  Neurological: Negative for headaches.  ____________________________________________   PHYSICAL EXAM:  VITAL SIGNS: ED Triage Vitals  Enc Vitals Group     BP 10/30/21 2109 (!) 152/80     Pulse Rate 10/30/21 2109 92     Resp 10/30/21 2109 20     Temp 10/30/21 2109 98.4 F (36.9 C)     Temp src --      SpO2 10/30/21 2109 100 %     Weight 10/30/21 2109 249 lb 1.9 oz (113 kg)     Height 10/30/21 2109 5\' 7"  (1.702 m)   Constitutional: Alert and oriented. Well appearing and in no acute distress. Eyes: Conjunctivae are normal. Head: Atraumatic. Nose: No congestion/rhinnorhea. Mouth/Throat: Mucous membranes are moist.   Neck: No stridor.   Cardiovascular: Good peripheral circulation. Respiratory: Normal  respiratory effort.   Gastrointestinal: Soft and nontender. No distention.  Musculoskeletal:  No gross deformities of extremities. Neurologic:  Normal speech and language.  Skin: Small pustular rash with erythematous base extending in a dermatomal distribution across the left flank.   ____________________________________________   PROCEDURES  Procedure(s) performed:   Procedures  None  ____________________________________________   INITIAL IMPRESSION / ASSESSMENT AND PLAN / ED COURSE  Pertinent labs & imaging results that were available during my care of the patient were reviewed by me and considered in my medical decision making (see chart for details).   This patient is Presenting for Evaluation of flank pain/rash, which does require a range of treatment options, and is a complaint that involves a high risk of morbidity and mortality.  The Differential Diagnoses include zoster, cellulitis, abscess, ureteral stone, musculoskeletal strain, etc.   Social Determinants of Health Risk patient is a non-smoker.   Medical Decision Making: Summary:  Patient presents emergency department with rash and presentation consistent with herpes zoster.  Patient's proceeding pain also consistent in quality with nerve/zoster pain.  Exam not consistent with cellulitis or other bacterial infection.  Plan for antivirals and pain management at home with PCP follow-up.  Patient cautioned that rash is contagious and to avoid high risk groups.  Advised to keep the rash covered.   Disposition: discharge  ____________________________________________  FINAL CLINICAL IMPRESSION(S) / ED DIAGNOSES  Final diagnoses:  Herpes zoster without complication     NEW  OUTPATIENT MEDICATIONS STARTED DURING THIS VISIT:  Discharge Medication List as of 10/31/2021 12:00 AM     START taking these medications   Details  oxyCODONE-acetaminophen (PERCOCET/ROXICET) 5-325 MG tablet Take 1 tablet by mouth every 6  (six) hours as needed for severe pain., Starting Thu 10/30/2021, Normal    senna-docusate (SENOKOT-S) 8.6-50 MG tablet Take 1 tablet by mouth at bedtime as needed for mild constipation., Starting Thu 10/30/2021, Normal    valACYclovir (VALTREX) 1000 MG tablet Take 1 tablet (1,000 mg total) by mouth 3 (three) times daily for 7 days., Starting Thu 10/30/2021, Until Thu 11/06/2021, Normal        Note:  This document was prepared using Dragon voice recognition software and may include unintentional dictation errors.  Alona Bene, MD, [facility]    Liya Strollo, Arlyss Repress, MD 10/31/21 [phone]

## 2021-10-30 NOTE — ED Triage Notes (Signed)
Patient here POV from Home.  Patient here POV from Rash noted to Left Lower Back. States she has been Treating her Lower Back recently with Topical Pain Medication due to Sprain in Back but she noted the Rash approximately 2 Hours ago and was not present this AM.  No Confirmed Fevers. No Drainage Noted.   NAD Noted during Triage. A&Ox4. GCS 15. Ambulatory.

## 2021-10-31 NOTE — Discharge Instructions (Signed)
You were seen in the emergency room today with shingles rash.  I am starting you on antiviral medication and have sent in a prescription for pain medicine.  Please take these as directed.  Your rash is contagious and you should avoid contact with the elderly, small infants, immune compromised individuals until the rash scabs over.  If you develop any rash on your face or near your eye you need to return for reevaluation.

## 2021-11-12 ENCOUNTER — Ambulatory Visit: Payer: 59

## 2021-12-02 ENCOUNTER — Encounter (INDEPENDENT_AMBULATORY_CARE_PROVIDER_SITE_OTHER): Payer: Self-pay | Admitting: Family Medicine

## 2021-12-02 ENCOUNTER — Ambulatory Visit (INDEPENDENT_AMBULATORY_CARE_PROVIDER_SITE_OTHER): Payer: 59 | Admitting: Family Medicine

## 2021-12-02 VITALS — BP 132/61 | HR 80 | Temp 98.1°F | Ht 67.0 in | Wt 244.0 lb

## 2021-12-02 DIAGNOSIS — Z0289 Encounter for other administrative examinations: Secondary | ICD-10-CM

## 2021-12-02 DIAGNOSIS — Z6838 Body mass index (BMI) 38.0-38.9, adult: Secondary | ICD-10-CM

## 2021-12-02 DIAGNOSIS — E559 Vitamin D deficiency, unspecified: Secondary | ICD-10-CM

## 2021-12-02 DIAGNOSIS — R0602 Shortness of breath: Secondary | ICD-10-CM

## 2021-12-03 ENCOUNTER — Ambulatory Visit: Payer: 59

## 2021-12-04 ENCOUNTER — Ambulatory Visit: Payer: 59 | Attending: Family Medicine

## 2021-12-04 DIAGNOSIS — M25571 Pain in right ankle and joints of right foot: Secondary | ICD-10-CM | POA: Diagnosis present

## 2021-12-04 DIAGNOSIS — R262 Difficulty in walking, not elsewhere classified: Secondary | ICD-10-CM | POA: Diagnosis present

## 2021-12-04 DIAGNOSIS — M542 Cervicalgia: Secondary | ICD-10-CM | POA: Insufficient documentation

## 2021-12-04 DIAGNOSIS — M25572 Pain in left ankle and joints of left foot: Secondary | ICD-10-CM | POA: Insufficient documentation

## 2021-12-04 NOTE — Therapy (Signed)
OUTPATIENT PHYSICAL THERAPY RECERTIFICATION NOTE   Patient Name: Veronica Hunter MRN: [id number] DOB:11/29/1970, 51 y.o., female Today's Date: 12/04/2021   PT End of Session - 12/04/21 1109     Visit Number 25    Number of Visits 30    Date for PT Re-Evaluation 02/26/22    Authorization Type [employer]; recertification 03/12/20; 30 v max per year    PT Start Time 1107    PT Stop Time 1145    PT Time Calculation (min) 38 min    Activity Tolerance Patient tolerated treatment well    Behavior During Therapy WFL for tasks assessed/performed               Past Medical History:  Diagnosis Date   H/O fracture 2020   left foot, right ankle   Past Surgical History:  Procedure Laterality Date   GALLBLADDER SURGERY  1996   right ankle fracture surgery  2020   2 plates/10 screws    right ankle tendon repair  2010   TONSILLECTOMY  1996   TOTAL VAGINAL HYSTERECTOMY  2009   Patient Active Problem List   Diagnosis Date Noted   Difficulty in walking, not elsewhere classified 05/21/2021   Pain in left ankle and joints of left foot 05/21/2021   Myofascial pain syndrome, cervical 02/12/2021   Dizziness 04/07/2020   Cervicalgia 04/07/2020    PCP: Dr. Shawn Lazoff  REFERRING PROVIDER: Dr Antonia Ahern  REFERRING DIAG:  M54.2,G89.29 (ICD-10-CM) - Chronic neck pain  M79.18 (ICD-10-CM) - Cervical myofascial pain syndrome  M54.2 (ICD-10-CM) - Cervicalgia    THERAPY DIAG:  Difficulty in walking, not elsewhere classified  Pain in left ankle and joints of left foot  Cervicalgia  Pain in right ankle and joints of right foot  Rationale for Evaluation and Treatment Rehabilitation  ONSET DATE: 08/12/21 - date of referral  SUBJECTIVE:                                                                                                                                                                                                         SUBJECTIVE STATEMENT: I feel like shots are wearing off  and pain is coming back. I am starting to get buzzing sensation in my R hand back again and intermittently pain going to R jaw. Ankle pain is also getting worst. If I lift the dog, it will flare it up again. I am compliant with 85% with exercises.   PERTINENT HISTORY:  Hx of R ankle surgery  PAIN:  Are you having pain? Yes: NPRS scale: 9-10/10   Pain location: mid to lower back Pain description: dull, ache, throbbing Aggravating factors: stress, lifting, sleeping,  housework Relieving factors: dry needling, heat, exercises.  PRECAUTIONS: None  WEIGHT BEARING RESTRICTIONS No  FALLS:  Has patient fallen in last 6 months? No  LIVING ENVIRONMENT: Lives with: lives with their spouse Lives in: House/apartment Stairs: No Has following equipment at home: None  OCCUPATION: retired.  PLOF: Independent  PATIENT GOALS reduce neck pain  OBJECTIVE:    PATIENT SURVEYS:  NDI 26/50- 52% disability ; 28/50 - 56% disability   COGNITION: Overall cognitive status: Within functional limits for tasks assessed   SENSATION: Not tested  POSTURE: rounded shoulders and forward head   CERVICAL ROM:   Active ROM A/PROM 08/13/21 AROM 08/27/21 AROM 12/04/21  /Flexion 50 54 48  Extension/ 35 45 55  Right lateral flexion 25    Left lateral flexion 25    Right rotation 50 65 66  Left rotation 50 62 64   (Blank rows = not tested)      TODAY'S TREATMENT:    Mobility assessment: Restricted OA rotation to R by 50% compared to Left Grade IV mobilization in lateral glide to R OA initated R arm numbness and mild dizziness Grade II-III mobilization on R OA performed 6 x 60" to R only Pt educated on benefits of strengthening of her muscles to improve endurance and decrease pain and spasms. Pt educated on gradually increasing reps over time to build endurance.     PATIENT EDUCATION:  Education details: see above Person educated: Patient Education method: Explanation Education  comprehension: verbalized understanding   HOME EXERCISE PROGRAM: Access Code [id number]  ASSESSMENT:  CLINICAL IMPRESSION: Patient has been seen for total of 25 sessions in physical therapy. Patient reported improved symptoms of UE symptoms with upper c-spine mobilization. Pt will benefit from continued skilled PT to improve pain management and to progress to independet HEP management.   OBJECTIVE IMPAIRMENTS Abnormal gait, decreased activity tolerance, decreased balance, decreased endurance, decreased mobility, difficulty walking, decreased ROM, decreased strength, dizziness, hypomobility, increased fascial restrictions, increased muscle spasms, impaired flexibility, postural dysfunction, and pain.   ACTIVITY LIMITATIONS carrying, lifting, bending, standing, squatting, sleeping, and stairs  PARTICIPATION LIMITATIONS: meal prep, cleaning, laundry, driving, and yard work  PERSONAL FACTORS Past/current experiences and Time since onset of injury/illness/exacerbation are also affecting patient's functional outcome.   REHAB POTENTIAL: Good  CLINICAL DECISION MAKING: Stable/uncomplicated  EVALUATION COMPLEXITY: Low   GOALS: GOALS: Goals reviewed with patient? Yes   SHORT TERM GOALS:   STG Name Target Date Goal status  1 Pt will be able to resume driving with <5/05 pain in R ankle Baseline: has not resumed driving (eval) 05/16/7671   Not met  2 Pt will demo 5 deg of AROM of R ankle dorsiflexion to improve gait Baseline: 0 deg (eval); 15 deg 05/21/21 04/09/2021 Goal met   3 Pt will demo 10 deg of ankle inversion in R ankle to improve ability to pivot on R LE Baseline:2 deg (eval); 10 deg 05/07/21 04/09/2021 Goal met  4 Pt will be able to perform 5x sit to stand <12 sec without HHA to improve functional strength of LE Baseline: 15 sec (eval) 04/09/2021 Not assessed  5.  Patient will demo 15 deg of R ankle ivnersion ROM to improve ankle ROM on uneven surfaces 10/22/2021     Not met  6. Pt  will report 60 deg of cervical flexion and 40 deg of cervical extension with <2/10 pain (50 deg  flexion and 35 deg extension with 8/10 pain in neck) 10/22/2021   Goal met 12/04/21    LONG TERM GOALS:    LTG Name Target Date Goal status  1 Pt will demo gait speed without AD that is >1 m/s to improve community ambulation Baseline:0.40m/s with st. Cane (Eval); 0.99m/s without AD 06/20/21 02/12/2022     Goal met  2 Pt will be able to perform 10 unilateral heel raises to improve strength of plantarflexors in R LE Baseline: pt unable to perform standing unilateral heel raise (eva); 05/21/21: unable 05/21/21; 1 rep 06/20/21 02/12/2022  Revised 08/13/21  3 Pt will be able to ambulate 400 feet on grass without AD and without pain in ankle to improve community access Baseline: not attempted (eval); not attempted 05/21/21 02/12/2022  Goal met  4.  Pt will report overall 50% reduction in her neck pain to improve self management 02/26/22 Partially met 12/04/21  5.  Pt will report 15% improvement on NDI to improve self reported function (reported 4% improvement 12/04/21) 02/26/22 Partially met 12/04/21       PLAN: PT FREQUENCY: 2x/month  PT DURATION: other: 5 sessions  PLANNED INTERVENTIONS: Therapeutic exercises, Therapeutic activity, Neuromuscular re-education, Balance training, Gait training, Patient/Family education, Joint manipulation, Joint mobilization, Vestibular training, Canalith repositioning, Electrical stimulation, Spinal mobilization, Cryotherapy, Moist heat, Traction, and Manual therapy  PLAN FOR NEXT SESSION: Upper cervical mobilization   Karmesh N Patel, PT 12/04/2021, 11:44 AM 

## 2021-12-05 NOTE — Progress Notes (Signed)
Office: [phone]  /  Fax: [phone]  Initial Visit  Veronica Hunter was seen in clinic today to evaluate for obesity. She is interested in losing weight to improve overall health and reduce the risk of weight related complications.   She presents today to review program treatment options, initial physical assessment, and evaluation.      Past medical history includes:   Past Medical History:  Diagnosis Date   H/O fracture 2020   left foot, right ankle     Objective:   BP 132/61   Pulse 80   Temp 98.1 F (36.7 C)   Ht 5\' 7"  (1.702 m)   Wt 244 lb (110.7 kg)   SpO2 97%   BMI 38.22 kg/m  She was weighed on the bioimpedance scale:  Body mass index is 38.22 kg/m.  General:  Alert, oriented and cooperative. Patient is in no acute distress.  Respiratory: Normal respiratory effort, no problems with respiration noted  Extremities: Normal range of motion.    Mental Status: Normal mood and affect. Normal behavior. Normal judgment and thought content.   Assessment and Plan:  1. SOBOE (shortness of breath on exertion): EKG at appt  2. Vitamin D deficiency: Vitamin d level at fist appointment  3. Class 2 severe obesity with serious comorbidity and body mass index (BMI) of 38.0 to 38.9 in adult, unspecified obesity type ([name])      Obesity Treatment Plan:  She will work on garnering support from family and friends to begin weight loss journey. Work on eliminating or reducing the presence of highly processed, calorie dense foods in the home. Complete provided nutritional and psychosocial assessment questionnaire.    Veronica Hunter will follow up in the next 1-2 weeks to review the above steps and continue evaluation and treatment.  Obesity Education Performed Today:  She was weighed on the bioimpedance scale and results were discussed and documented in the synopsis.  We discussed obesity as a disease and the importance of a more detailed evaluation of all the factors  contributing to the disease.  We discussed the importance of long term lifestyle changes which include nutrition, exercise and behavioral modifications as well as the importance of customizing this to her specific health and social needs.  We discussed the benefits of reaching a healthier weight to alleviate the symptoms of existing conditions and reduce the risks of the biomechanical, metabolic and psychological effects of obesity.  We discussed the goals of this program is to improve her overall health and not simply achieve a specific BMI.  Frequent visits are very important to patient success. I plan to see her every 2 weeks for the first 3 months and then evaluate the visit frequency after that time. I explained obesity is a life-long chronic disease and long term treatments would be required. Medications to help her follow his eating plan may be offered as appropriate but are not required. All medication decisions will be made together after the initial workup is done and benefits and side effects are discussed in depth.  The clinic rules were reviewed including the late policy, cancellation policy, no show and program fees.  Veronica Hunter appears to be in the action stage of change and states they are ready to start intensive lifestyle modifications and behavioral modifications.  40 minutes was spent today on this visit including the above counseling, pre-visit chart review, and post-visit documentation.  I have reviewed the above documentation for accuracy and completeness, and I agree with the above. -  Reuben Likes, MD

## 2021-12-10 ENCOUNTER — Encounter (INDEPENDENT_AMBULATORY_CARE_PROVIDER_SITE_OTHER): Payer: Self-pay | Admitting: Family Medicine

## 2021-12-10 ENCOUNTER — Ambulatory Visit (INDEPENDENT_AMBULATORY_CARE_PROVIDER_SITE_OTHER): Payer: 59 | Admitting: Family Medicine

## 2021-12-10 VITALS — BP 123/76 | HR 77 | Temp 97.9°F | Ht 67.0 in | Wt 244.0 lb

## 2021-12-10 DIAGNOSIS — E559 Vitamin D deficiency, unspecified: Secondary | ICD-10-CM | POA: Diagnosis not present

## 2021-12-10 DIAGNOSIS — M797 Fibromyalgia: Secondary | ICD-10-CM

## 2021-12-10 DIAGNOSIS — R5383 Other fatigue: Secondary | ICD-10-CM

## 2021-12-10 DIAGNOSIS — I10 Essential (primary) hypertension: Secondary | ICD-10-CM

## 2021-12-10 DIAGNOSIS — Z1331 Encounter for screening for depression: Secondary | ICD-10-CM

## 2021-12-10 DIAGNOSIS — Z6838 Body mass index (BMI) 38.0-38.9, adult: Secondary | ICD-10-CM

## 2021-12-10 DIAGNOSIS — R739 Hyperglycemia, unspecified: Secondary | ICD-10-CM

## 2021-12-10 DIAGNOSIS — R0602 Shortness of breath: Secondary | ICD-10-CM

## 2021-12-11 LAB — COMPREHENSIVE METABOLIC PANEL
ALT: 31 IU/L (ref 0–32)
AST: 18 IU/L (ref 0–40)
Albumin/Globulin Ratio: 1.8 (ref 1.2–2.2)
Albumin: 4.6 g/dL (ref 3.8–4.9)
Alkaline Phosphatase: 91 IU/L (ref 44–121)
BUN/Creatinine Ratio: 15 (ref 9–23)
BUN: 12 mg/dL (ref 6–24)
Bilirubin Total: 0.2 mg/dL (ref 0.0–1.2)
CO2: 22 mmol/L (ref 20–29)
Calcium: 9.1 mg/dL (ref 8.7–10.2)
Chloride: 101 mmol/L (ref 96–106)
Creatinine, Ser: 0.78 mg/dL (ref 0.57–1.00)
Globulin, Total: 2.6 g/dL (ref 1.5–4.5)
Glucose: 91 mg/dL (ref 70–99)
Potassium: 4.6 mmol/L (ref 3.5–5.2)
Sodium: 141 mmol/L (ref 134–144)
Total Protein: 7.2 g/dL (ref 6.0–8.5)
eGFR: 91 mL/min/{1.73_m2} (ref >59)

## 2021-12-11 LAB — CBC WITH DIFFERENTIAL/PLATELET
Basophils Absolute: 0 10*3/uL (ref 0.0–0.2)
Basos: 1 %
EOS (ABSOLUTE): 0.1 10*3/uL (ref 0.0–0.4)
Eos: 2 %
Hematocrit: 42.7 % (ref 34.0–46.6)
Hemoglobin: 14.3 g/dL (ref 11.1–15.9)
Immature Grans (Abs): 0 10*3/uL (ref 0.0–0.1)
Immature Granulocytes: 0 %
Lymphocytes Absolute: 1.5 10*3/uL (ref 0.7–3.1)
Lymphs: 28 %
MCH: 30.4 pg (ref 26.6–33.0)
MCHC: 33.5 g/dL (ref 31.5–35.7)
MCV: 91 fL (ref 79–97)
Monocytes Absolute: 0.4 10*3/uL (ref 0.1–0.9)
Monocytes: 8 %
Neutrophils Absolute: 3.4 10*3/uL (ref 1.4–7.0)
Neutrophils: 61 %
Platelets: 198 10*3/uL (ref 150–450)
RBC: 4.71 x10E6/uL (ref 3.77–5.28)
RDW: 12.9 % (ref 11.7–15.4)
WBC: 5.5 10*3/uL (ref 3.4–10.8)

## 2021-12-11 LAB — LIPID PANEL WITH LDL/HDL RATIO
Cholesterol, Total: 159 mg/dL (ref 100–199)
HDL: 39 mg/dL — ABNORMAL LOW (ref >39)
LDL Chol Calc (NIH): 102 mg/dL — ABNORMAL HIGH (ref 0–99)
LDL/HDL Ratio: 2.6 ratio (ref 0.0–3.2)
Triglycerides: 94 mg/dL (ref 0–149)
VLDL Cholesterol Cal: 18 mg/dL (ref 5–40)

## 2021-12-11 LAB — THYROID PANEL WITH TSH
Free Thyroxine Index: 1.8 (ref 1.2–4.9)
T3 Uptake Ratio: 22 % — ABNORMAL LOW (ref 24–39)
T4, Total: 8.2 ug/dL (ref 4.5–12.0)
TSH: 3.13 u[IU]/mL (ref 0.450–4.500)

## 2021-12-11 LAB — VITAMIN B12: Vitamin B-12: 867 pg/mL (ref 232–1245)

## 2021-12-11 LAB — HEMOGLOBIN A1C
Est. average glucose Bld gHb Est-mCnc: 111 mg/dL
Hgb A1c MFr Bld: 5.5 % (ref 4.8–5.6)

## 2021-12-11 LAB — INSULIN, RANDOM: INSULIN: 90.5 u[IU]/mL — ABNORMAL HIGH (ref 2.6–24.9)

## 2021-12-11 LAB — VITAMIN D 25 HYDROXY (VIT D DEFICIENCY, FRACTURES): Vit D, 25-Hydroxy: 27.5 ng/mL — ABNORMAL LOW (ref 30.0–100.0)

## 2021-12-11 LAB — FOLATE: Folate: 5.1 ng/mL (ref >3.0)

## 2021-12-15 NOTE — Progress Notes (Signed)
Chief Complaint:   OBESITY Veronica Hunter (MR# [id number]) is a 51 y.o. female who presents for evaluation and treatment of obesity and related comorbidities. Current BMI is Body mass index is 38.22 kg/m. Veronica Hunter has been struggling with her weight for many years and has been unsuccessful in either losing weight, maintaining weight loss, or reaching her healthy weight goal.  Veronica Hunter brought in supplement sheet from information appointment. She is a part time Music therapist. Lives with husband who may of may not change how he eats. Eating out lunch and dinner daily; habitually same restaurants. May or may not have breakfast. Breakfast is mini sleeve of ritz. Lunch is Lox, stock and barrel--Ruben sandwich with a side of [facility] dressing and carrot sticks, (satisfied) with water and tea. [employer] for supper--chopped chicken salad with ranch, sweet tea and water (full and satisfied)+ Peach cobbler. Wants to start eating out less.   Veronica Hunter is currently in the action stage of change and ready to dedicate time achieving and maintaining a healthier weight. Veronica Hunter is interested in becoming our patient and working on intensive lifestyle modifications including (but not limited to) diet and exercise for weight loss.  Veronica Hunter's habits were reviewed today and are as follows: Her family eats meals together, her desired weight loss is 94 pounds, she has been heavy most of her life, she started gaining weight after her hysterectomy 2009, her heaviest weight ever was 248 pounds, she is a picky eater and doesn't like to eat healthier foods, she has significant food cravings issues, she skips meals frequently, she frequently makes poor food choices, she frequently eats larger portions than normal, and she has binge eating behaviors.  Depression Screen Veronica Hunter Food and Mood (modified PHQ-9) score was 16.     12/10/2021    2:57 PM  Depression screen PHQ 2/9  Decreased Interest 3  Down,  Depressed, Hopeless 2  PHQ - 2 Score 5  Altered sleeping 2  Tired, decreased energy 3  Change in appetite 2  Feeling bad or failure about yourself  1  Trouble concentrating 2  Moving slowly or fidgety/restless 1  Suicidal thoughts 0  PHQ-9 Score 16  Difficult doing work/chores Somewhat difficult   Subjective:   1. Other fatigue EKG--avf with RSR NSR at 75 bpm. Veronica Hunter admits to daytime somnolence and admits to waking up still tired. Patient has a history of symptoms of daytime fatigue, morning fatigue, and morning headache. Twylia generally gets  6-8  hours of sleep per night, and states that she has nightime awakenings. Snoring is present. Apneic episodes are not present. Epworth Sleepiness Score is 11.    2. SOBOE (shortness of breath on exertion) Veronica Hunter notes increasing shortness of breath with exercising and seems to be worsening over time with weight gain. She notes getting out of breath sooner with activity than she used to. This has not gotten worse recently. Wynell denies shortness of breath at rest or orthopnea.   3. Vitamin D deficiency Veronica Hunter's last Vit D level 25 in April. She is on 55 mcg daily.  4. Hyperglycemia Elevated blood sugar. A1c from 2021.  5. Essential hypertension Veronica Hunter was diagnosed years ago. Blood pressure normal today. Denies chest pain, chest pressure and headache.  6. Fibromyalgia Noted on chart years ago. Not on any medication.  Assessment/Plan:   1. Other fatigue We will obtain labs today. Veronica Hunter does feel that her weight is causing her energy to be lower than it should be. Fatigue may  be related to obesity, depression or many other causes. Labs will be ordered, and in the meanwhile, Veronica Hunter will focus on self care including making healthy food choices, increasing physical activity and focusing on stress reduction.   - EKG 12-Lead - Vitamin B12 - Folate - Hemoglobin A1c - Insulin, random - Thyroid Panel With TSH  2. SOBOE  (shortness of breath on exertion) Veronica Hunter does feel that she gets out of breath more easily that she used to when she exercises. Veronica Hunter's shortness of breath appears to be obesity related and exercise induced. She has agreed to work on weight loss and gradually increase exercise to treat her exercise induced shortness of breath. Will continue to monitor closely.  - Lipid Panel With LDL/HDL Ratio - CBC w/Diff/Platelet  3. Vitamin D deficiency We will obtain labs today.  - VITAMIN D 25 Hydroxy (Vit-D Deficiency, Fractures)  4. Hyperglycemia We will obtain labs today. A1c, Insulin level.  5. Essential hypertension We will obtain labs today.  - Comprehensive metabolic panel  6. Fibromyalgia Follow up on symptoms at next appointment.  7. Depression screening Veronica Hunter had a positive depression screening. Depression is commonly associated with obesity and often results in emotional eating behaviors. We will monitor this closely and work on CBT to help improve the non-hunger eating patterns. Referral to Psychology may be required if no improvement is seen as she continues in our clinic.   8. Class 2 severe obesity with serious comorbidity and body mass index (BMI) of 38.0 to 38.9 in adult, unspecified obesity type ([facility]) Veronica Hunter is currently in the action stage of change and her goal is to continue with weight loss efforts. I recommend Veronica Hunter begin the structured treatment plan as follows:  She has agreed to the Category 3 Plan and keeping a food journal and adhering to recommended goals of 450-600 calories and 40+ grams of protein at supper.  Exercise goals: No exercise has been prescribed at this time.   Behavioral modification strategies: increasing lean protein intake, decreasing eating out, meal planning and cooking strategies, and planning for success.  She was informed of the importance of frequent follow-up visits to maximize her success with intensive lifestyle modifications  for her multiple health conditions. She was informed we would discuss her lab results at her next visit unless there is a critical issue that needs to be addressed sooner. Veronica Hunter agreed to keep her next visit at the agreed upon time to discuss these results.  Objective:   Blood pressure 123/76, pulse 77, temperature 97.9 F (36.6 C), height 5\' 7"  (1.702 m), weight 244 lb (110.7 kg), SpO2 99 %. Body mass index is 38.22 kg/m.  EKG: Normal sinus rhythm, rate 75 bpm.  Indirect Calorimeter completed today shows a VO2 of 253 ml and a REE of 1742.  Her calculated basal metabolic rate is thus her basal metabolic rate is worse than expected.  General: Cooperative, alert, well developed, in no acute distress. HEENT: Conjunctivae and lids unremarkable. Cardiovascular: Regular rhythm.  Lungs: Normal work of breathing. Neurologic: No focal deficits.   Lab Results  Component Value Date   CREATININE 0.78 12/10/2021   BUN 12 12/10/2021   NA 141 12/10/2021   K 4.6 12/10/2021   CL 101 12/10/2021   CO2 22 12/10/2021   Lab Results  Component Value Date   ALT 31 12/10/2021   AST 18 12/10/2021   ALKPHOS 91 12/10/2021   BILITOT 0.2 12/10/2021   Lab Results  Component Value Date  HGBA1C 5.5 12/10/2021   Lab Results  Component Value Date   INSULIN 90.5 (H) 12/10/2021   Lab Results  Component Value Date   TSH 3.130 12/10/2021   Lab Results  Component Value Date   CHOL 159 12/10/2021   HDL 39 (L) 12/10/2021   LDLCALC 102 (H) 12/10/2021   TRIG 94 12/10/2021   Lab Results  Component Value Date   WBC 5.5 12/10/2021   HGB 14.3 12/10/2021   HCT 42.7 12/10/2021   MCV 91 12/10/2021   PLT 198 12/10/2021   No results found for: "IRON", "TIBC", "FERRITIN"  Attestation Statements:   Reviewed by clinician on day of visit: allergies, medications, problem list, medical history, surgical history, family history, social history, and previous encounter notes.  Time spent on visit  including pre-visit chart review and post-visit charting and care was 45 minutes.   I, Elnora Morrison, RMA am acting as transcriptionist for Coralie Common, MD.  I have reviewed the above documentation for accuracy and completeness, and I agree with the above. - Coralie Common, MD

## 2021-12-24 ENCOUNTER — Encounter (INDEPENDENT_AMBULATORY_CARE_PROVIDER_SITE_OTHER): Payer: Self-pay | Admitting: Family Medicine

## 2021-12-24 ENCOUNTER — Ambulatory Visit (INDEPENDENT_AMBULATORY_CARE_PROVIDER_SITE_OTHER): Payer: 59 | Admitting: Family Medicine

## 2021-12-24 VITALS — BP 124/71 | HR 83 | Temp 98.2°F | Ht 67.0 in | Wt 246.0 lb

## 2021-12-24 DIAGNOSIS — E669 Obesity, unspecified: Secondary | ICD-10-CM | POA: Diagnosis not present

## 2021-12-24 DIAGNOSIS — E88819 Insulin resistance, unspecified: Secondary | ICD-10-CM

## 2021-12-24 DIAGNOSIS — E559 Vitamin D deficiency, unspecified: Secondary | ICD-10-CM

## 2021-12-24 DIAGNOSIS — Z6838 Body mass index (BMI) 38.0-38.9, adult: Secondary | ICD-10-CM

## 2021-12-24 DIAGNOSIS — E785 Hyperlipidemia, unspecified: Secondary | ICD-10-CM | POA: Diagnosis not present

## 2021-12-24 MED ORDER — VITAMIN D (ERGOCALCIFEROL) 1.25 MG (50000 UNIT) PO CAPS: 50000.0000 [IU] | ORAL_CAPSULE | ORAL | 0 refills | Status: DC

## 2021-12-25 ENCOUNTER — Ambulatory Visit: Payer: 59

## 2021-12-26 ENCOUNTER — Ambulatory Visit: Payer: 59 | Attending: Family Medicine

## 2021-12-26 DIAGNOSIS — M542 Cervicalgia: Secondary | ICD-10-CM | POA: Diagnosis present

## 2021-12-26 DIAGNOSIS — R262 Difficulty in walking, not elsewhere classified: Secondary | ICD-10-CM

## 2021-12-26 DIAGNOSIS — M25572 Pain in left ankle and joints of left foot: Secondary | ICD-10-CM

## 2021-12-26 NOTE — Therapy (Signed)
OUTPATIENT PHYSICAL THERAPY RECERTIFICATION NOTE   Patient Name: Veronica Hunter MRN: [id number] DOB:1971-02-02, 51 y.o., female Today's Date: 12/26/2021   PT End of Session - 12/26/21 1024     Visit Number 26    Number of Visits 30    Date for PT Re-Evaluation 02/26/22    Authorization Type U[employer]; recertification 09/07/67; 30 v max per year    PT Start Time 1020    PT Stop Time 1105    PT Time Calculation (min) 45 min    Activity Tolerance Patient tolerated treatment well    Behavior During Therapy WFL for tasks assessed/performed               Past Medical History:  Diagnosis Date   Anxiety    Back pain    Bilateral swelling of feet    Chest pain    Constipation    Dizziness    Fibromyalgia    Gallbladder problem    H/O fracture 2020   left foot, right ankle   Headache    Heartburn    High blood pressure    Indigestion    Joint pain    Multiple food allergies    Palpitations    Vitamin B12 deficiency    Vitamin D deficiency    Past Surgical History:  Procedure Laterality Date   GALLBLADDER SURGERY  1996   right ankle fracture surgery  2020   2 plates/10 screws    right ankle tendon repair  2010   [place]  2009   Patient Active Problem List   Diagnosis Date Noted   Difficulty in walking, not elsewhere classified 05/21/2021   Pain in left ankle and joints of left foot 05/21/2021   Myofascial pain syndrome, cervical 02/12/2021   Dizziness 04/07/2020   Cervicalgia 04/07/2020    PCP: Dr. Irene Pap  REFERRING PROVIDER: Dr Sarina Ill  REFERRING DIAG:  [phone] (ICD-10-CM) - Chronic neck pain  M79.18 (ICD-10-CM) - Cervical myofascial pain syndrome  M54.2 (ICD-10-CM) - Cervicalgia    THERAPY DIAG:  Difficulty in walking, not elsewhere classified  Pain in left ankle and joints of left foot  Cervicalgia  Rationale for Evaluation and Treatment Rehabilitation  ONSET DATE: 08/12/21 - date of  referral  SUBJECTIVE:                                                                                                                                                                                                         SUBJECTIVE STATEMENT: Pt reports that she  felt better for 36 hours and then symptoms in her R UE came back to the point she hasn't been able to shake it off. She is also reporting some "whooziness". She is getting trigger point injection on Monday. Pt reported of some spinning sensation for less than an hour. The "whooziness- like sitting on boat" feeling lasted for 1.5 days. Right now she is not feeling that sensation.   PERTINENT HISTORY:  Hx of R ankle surgery  PAIN:  Are you having pain? Yes: NPRS scale: 9-10/10 Pain location: mid to lower back Pain description: dull, ache, throbbing Aggravating factors: stress, lifting, sleeping,  housework Relieving factors: dry needling, heat, exercises.  PRECAUTIONS: None  WEIGHT BEARING RESTRICTIONS No  FALLS:  Has patient fallen in last 6 months? No  LIVING ENVIRONMENT: Lives with: lives with their spouse Lives in: House/apartment Stairs: No Has following equipment at home: None  OCCUPATION: retired.  PLOF: Independent  PATIENT GOALS reduce neck pain  OBJECTIVE:    PATIENT SURVEYS:  NDI 26/50- 52% disability ; 28/50 - 56% disability   COGNITION: Overall cognitive status: Within functional limits for tasks assessed   SENSATION: Not tested  POSTURE: rounded shoulders and forward head   CERVICAL ROM:   Active ROM A/PROM 08/13/21 AROM 08/27/21 AROM 12/04/21  /Flexion 50 54 48  Extension/ 35 45 55  Right lateral flexion 25    Left lateral flexion 25    Right rotation 50 65 66  Left rotation 50 62 64   (Blank rows = not tested)      TODAY'S TREATMENT:    Instrument assisted soft tissue mobilization to bil upper traps, R shoulder, cervical paraspinalis 15'  Patient education: reviewed following  aspects of chronic pain again with patient.  - Emphasis on strengthening and stretching - gradual exposure: working on modifying intensity and frequency of exercises to make sure she is not feeling pain while exercising but feeling an "effort" - redirecting brain to focus on how to "modify exercise" and working on active movements to help with desensitization of the nerves. - pt asked about using pool: pt educated that is good but she will have to gradually do laps to make sure she doesn't overperform exercises where she feels pain afterwards. - We discussed: joining gym and doing 49' of cardio, 15-20' of strength training with machines (not free weights right away) and then potentially doing pool and hot tub/sauna to relax afterwards few days a week to work on gradual exposure. - Pt educated that massage, trigger point injections, Instrument assisted soft tissue mobilization, dry needling are passive interventions for short term pain relief but combinations of strengthening and stretching will be for long term pain relief. Reveiwed a systematic review with patient that concluded this to improve patient's buy in.    PATIENT EDUCATION:  Education details: see above Person educated: Patient Education method: Explanation Education comprehension: verbalized understanding   HOME EXERCISE PROGRAM: Access Code [id number]  ASSESSMENT:  CLINICAL IMPRESSION: Pt reported improved cervical flexibility and reduced symptoms with shoulder flexion/adduction in her R UE after manual therapy. Today's session was focused on chronic pain education to continue to improve patients compliance to work on gradual exposure with strengthening and stretching for longer term improved outcomes.  OBJECTIVE IMPAIRMENTS Abnormal gait, decreased activity tolerance, decreased balance, decreased endurance, decreased mobility, difficulty walking, decreased ROM, decreased strength, dizziness, hypomobility, increased fascial  restrictions, increased muscle spasms, impaired flexibility, postural dysfunction, and pain.   ACTIVITY LIMITATIONS carrying, lifting, bending, standing, squatting, sleeping,  and stairs  PARTICIPATION LIMITATIONS: meal prep, cleaning, laundry, driving, and yard work  PERSONAL FACTORS Past/current experiences and Time since onset of injury/illness/exacerbation are also affecting patient's functional outcome.   REHAB POTENTIAL: Good  CLINICAL DECISION MAKING: Stable/uncomplicated  EVALUATION COMPLEXITY: Low   GOALS: GOALS: Goals reviewed with patient? Yes   SHORT TERM GOALS:   STG Name Target Date Goal status  1 Pt will be able to resume driving with <3/66 pain in R ankle Baseline: has not resumed driving (eval) 10/07/5945   Not met  2 Pt will demo 5 deg of AROM of R ankle dorsiflexion to improve gait Baseline: 0 deg (eval); 15 deg 05/21/21 04/09/2021 Goal met   3 Pt will demo 10 deg of ankle inversion in R ankle to improve ability to pivot on R LE Baseline:2 deg (eval); 10 deg 05/07/21 04/09/2021 Goal met  4 Pt will be able to perform 5x sit to stand <12 sec without HHA to improve functional strength of LE Baseline: 15 sec (eval) 04/09/2021 Not assessed  5.  Patient will demo 15 deg of R ankle ivnersion ROM to improve ankle ROM on uneven surfaces 10/22/2021     Not met  6. Pt will report 60 deg of cervical flexion and 40 deg of cervical extension with <2/10 pain (50 deg flexion and 35 deg extension with 8/10 pain in neck) 10/22/2021   Goal met 12/04/21    LONG TERM GOALS:    LTG Name Target Date Goal status  1 Pt will demo gait speed without AD that is >1 m/s to improve community ambulation Baseline:0.49ms with st. Cane (Eval); 0.964m without AD 06/20/21 02/12/2022     Goal met  2 Pt will be able to perform 10 unilateral heel raises to improve strength of plantarflexors in R LE Baseline: pt unable to perform standing unilateral heel raise (eva); 05/21/21: unable 05/21/21; 1 rep  06/20/21 02/12/2022  Revised 08/13/21  3 Pt will be able to ambulate 400 feet on grass without AD and without pain in ankle to improve community access Baseline: not attempted (eval); not attempted 05/21/21 02/12/2022  Goal met  4.  Pt will report overall 50% reduction in her neck pain to improve self management 02/26/22 Partially met 12/04/21  5.  Pt will report 15% improvement on NDI to improve self reported function (reported 4% improvement 12/04/21) 02/26/22 Partially met 12/04/21       PLAN: PT FREQUENCY: 2x/month  PT DURATION: other: 5 sessions  PLANNED INTERVENTIONS: Therapeutic exercises, Therapeutic activity, Neuromuscular re-education, Balance training, Gait training, Patient/Family education, Joint manipulation, Joint mobilization, Vestibular training, Canalith repositioning, Electrical stimulation, Spinal mobilization, Cryotherapy, Moist heat, Traction, and Manual therapy  PLAN FOR NEXT SESSION: Upper cervical mobilization   KaKerrie PleasurePT 12/26/2021, 10:24 AM

## 2021-12-30 NOTE — Progress Notes (Signed)
Chief Complaint:   OBESITY Veronica Hunter is here to discuss her progress with her obesity treatment plan along with follow-up of her obesity related diagnoses. Veronica Hunter is on the Category 3 Plan and keeping a food journal and adhering to recommended goals of 450-600 calories and 40+ grams of protein and states she is following her eating plan approximately 25% of the time. Veronica Hunter states she is walking 30 minutes 5 times per week.  Today's visit was #: 2 Starting weight: 244 lbs Starting date: 12/10/2021 Today's weight: 246 lbs Today's date: 12/24/2021 Total lbs lost to date: 0 lbs Total lbs lost since last in-office visit: 0  Interim History: Veronica Hunter left a few days after last appointment. She tried to increase her water intake and worked on getting breakfast in. Went to [facility] and then to [employer]. Planning on starting Cat 3 today.  Subjective:   1. Dyslipidemia Veronica Hunter's HDL of 39, Trigly of 94, LDL of 102. Not on medication.  2. Vitamin D deficiency Veronica Hunter's Vit D level of 227.5. Notes fatigue.  3. Insulin resistance Veronica Hunter's A1c at 5.5, insulin at 90.5. She is not on medication.  Assessment/Plan:   1. Dyslipidemia Veronica Hunter is to start Cat 3--repeat labs in 3-4 months.  2. Vitamin D deficiency Start Vit D 50k IU once a week for 1 month with 0 refills.  -Start Vitamin D, Ergocalciferol, (DRISDOL) 1.25 MG (50000 UNIT) CAPS capsule; Take 1 capsule (50,000 Units total) by mouth every 7 (seven) days.  Dispense: 4 capsule; Refill: 0  3. Insulin resistance Will repeat labs in 3 months. Continue with Cat 3 plan.  4. Obesity with current BMI of 38.6 Veronica Hunter is currently in the action stage of change. As such, her goal is to continue with weight loss efforts. She has agreed to the Category 3 Plan.   Exercise goals: No exercise has been prescribed at this time.  Behavioral modification strategies: increasing lean protein intake, meal planning and cooking  strategies, better snacking choices, and planning for success.  Veronica Hunter has agreed to follow-up with our clinic in 3 weeks. She was informed of the importance of frequent follow-up visits to maximize her success with intensive lifestyle modifications for her multiple health conditions.   Objective:   Blood pressure 124/71, pulse 83, temperature 98.2 F (36.8 C), height 5\' 7"  (1.702 m), weight 246 lb (111.6 kg), SpO2 98 %. Body mass index is 38.53 kg/m.  General: Cooperative, alert, well developed, in no acute distress. HEENT: Conjunctivae and lids unremarkable. Cardiovascular: Regular rhythm.  Lungs: Normal work of breathing. Neurologic: No focal deficits.   Lab Results  Component Value Date   CREATININE 0.78 12/10/2021   BUN 12 12/10/2021   NA 141 12/10/2021   K 4.6 12/10/2021   CL 101 12/10/2021   CO2 22 12/10/2021   Lab Results  Component Value Date   ALT 31 12/10/2021   AST 18 12/10/2021   ALKPHOS 91 12/10/2021   BILITOT 0.2 12/10/2021   Lab Results  Component Value Date   HGBA1C 5.5 12/10/2021   Lab Results  Component Value Date   INSULIN 90.5 (H) 12/10/2021   Lab Results  Component Value Date   TSH 3.130 12/10/2021   Lab Results  Component Value Date   CHOL 159 12/10/2021   HDL 39 (L) 12/10/2021   LDLCALC 102 (H) 12/10/2021   TRIG 94 12/10/2021   Lab Results  Component Value Date   VD25OH 27.5 (L) 12/10/2021   Lab Results  Component Value Date   WBC 5.5 12/10/2021   HGB 14.3 12/10/2021   HCT 42.7 12/10/2021   MCV 91 12/10/2021   PLT 198 12/10/2021   No results found for: "IRON", "TIBC", "FERRITIN"  Attestation Statements:   Reviewed by clinician on day of visit: allergies, medications, problem list, medical history, surgical history, family history, social history, and previous encounter notes.  Time spent on visit including pre-visit chart review and post-visit care and charting was 45 minutes.   I, Elnora Morrison, RMA am acting as  transcriptionist for Coralie Common, MD.  I have reviewed the above documentation for accuracy and completeness, and I agree with the above. - Coralie Common, MD

## 2021-12-31 ENCOUNTER — Ambulatory Visit: Payer: 59

## 2022-01-08 ENCOUNTER — Ambulatory Visit: Payer: 59 | Attending: Family Medicine

## 2022-01-08 DIAGNOSIS — M25572 Pain in left ankle and joints of left foot: Secondary | ICD-10-CM | POA: Insufficient documentation

## 2022-01-08 DIAGNOSIS — M542 Cervicalgia: Secondary | ICD-10-CM | POA: Insufficient documentation

## 2022-01-08 DIAGNOSIS — R262 Difficulty in walking, not elsewhere classified: Secondary | ICD-10-CM | POA: Diagnosis not present

## 2022-01-08 NOTE — Therapy (Signed)
OUTPATIENT PHYSICAL THERAPY Treatment note   Patient Name: Veronica Hunter MRN: [id number] DOB:06-18-1970, 51 y.o., female Today's Date: 01/08/2022   PT End of Session - 01/08/22 1223     Visit Number 27    Number of Visits 30    Date for PT Re-Evaluation 02/26/22    Authorization Type UHC; recertification 08/08/93; 30 v max per year    PT Start Time 1105    PT Stop Time 1145    PT Time Calculation (min) 40 min    Activity Tolerance Patient tolerated treatment well    Behavior During Therapy WFL for tasks assessed/performed               Past Medical History:  Diagnosis Date   Anxiety    Back pain    Bilateral swelling of feet    Chest pain    Constipation    Dizziness    Fibromyalgia    Gallbladder problem    H/O fracture 2020   left foot, right ankle   Headache    Heartburn    High blood pressure    Indigestion    Joint pain    Multiple food allergies    Palpitations    Vitamin B12 deficiency    Vitamin D deficiency    Past Surgical History:  Procedure Laterality Date   GALLBLADDER SURGERY  1996   right ankle fracture surgery  2020   2 plates/10 screws    right ankle tendon repair  2010   [place]  2009   Patient Active Problem List   Diagnosis Date Noted   Difficulty in walking, not elsewhere classified 05/21/2021   Pain in left ankle and joints of left foot 05/21/2021   Myofascial pain syndrome, cervical 02/12/2021   Dizziness 04/07/2020   Cervicalgia 04/07/2020    PCP: Dr. Irene Pap  REFERRING PROVIDER: Dr Sarina Ill  REFERRING DIAG:  [phone] (ICD-10-CM) - Chronic neck pain  M79.18 (ICD-10-CM) - Cervical myofascial pain syndrome  M54.2 (ICD-10-CM) - Cervicalgia    THERAPY DIAG:  Difficulty in walking, not elsewhere classified  Pain in left ankle and joints of left foot  Cervicalgia  Rationale for Evaluation and Treatment Rehabilitation  ONSET DATE: 08/12/21 - date of  referral  SUBJECTIVE:                                                                                                                                                                                                         SUBJECTIVE STATEMENT: Pt reports she felt  really good for 5 days after last session and she didn't have symptoms down her R hand. She is getting shot in her muscles tomorrow and she is debating whether to perform same treatment as last time and delay shots?  PERTINENT HISTORY:  Hx of R ankle surgery  PAIN:  Are you having pain? Yes: NPRS scale: 9-10/10 Pain location: mid to lower back Pain description: dull, ache, throbbing Aggravating factors: stress, lifting, sleeping,  housework Relieving factors: dry needling, heat, exercises.  PRECAUTIONS: None  WEIGHT BEARING RESTRICTIONS No  FALLS:  Has patient fallen in last 6 months? No  LIVING ENVIRONMENT: Lives with: lives with their spouse Lives in: House/apartment Stairs: No Has following equipment at home: None  OCCUPATION: retired.  PLOF: Independent  PATIENT GOALS reduce neck pain  OBJECTIVE:    PATIENT SURVEYS:  NDI 26/50- 52% disability ; 28/50 - 56% disability   COGNITION: Overall cognitive status: Within functional limits for tasks assessed   SENSATION: Not tested  POSTURE: rounded shoulders and forward head   CERVICAL ROM:   Active ROM A/PROM 08/13/21 AROM 08/27/21 AROM 12/04/21  /Flexion 50 54 48  Extension/ 35 45 55  Right lateral flexion 25    Left lateral flexion 25    Right rotation 50 65 66  Left rotation 50 62 64   (Blank rows = not tested)      TODAY'S TREATMENT:    Instrument assisted soft tissue mobilization to bil upper traps, levators scapluae, middle and posterior scalenes bil  Pt educated on redness and mild bruising that may show up after the treatment.  Revewed some chronic pain education to emphasize strengthening and ocnditioning for long term outcomes and  decreased flare up of symptoms. Discussed possibly finding an Product/process development scientist who can progress her slowly per week and trying that for 3 months and than carrying that  forward on her own. We discussed instrument assisted soft tissue mobilization will have short term benefits but long term, strengthening is considered superior.     PATIENT EDUCATION:  Education details: see above Person educated: Patient Education method: Explanation Education comprehension: verbalized understanding   HOME EXERCISE PROGRAM: Access Code [id number]  ASSESSMENT:  CLINICAL IMPRESSION: Pt reported improved flexibility in her tissues after instrument assisted soft tissue mobilization.   OBJECTIVE IMPAIRMENTS Abnormal gait, decreased activity tolerance, decreased balance, decreased endurance, decreased mobility, difficulty walking, decreased ROM, decreased strength, dizziness, hypomobility, increased fascial restrictions, increased muscle spasms, impaired flexibility, postural dysfunction, and pain.   ACTIVITY LIMITATIONS carrying, lifting, bending, standing, squatting, sleeping, and stairs  PARTICIPATION LIMITATIONS: meal prep, cleaning, laundry, driving, and yard work  PERSONAL FACTORS Past/current experiences and Time since onset of injury/illness/exacerbation are also affecting patient's functional outcome.   REHAB POTENTIAL: Good  CLINICAL DECISION MAKING: Stable/uncomplicated  EVALUATION COMPLEXITY: Low   GOALS: GOALS: Goals reviewed with patient? Yes   SHORT TERM GOALS:   STG Name Target Date Goal status  1 Pt will be able to resume driving with <3/53 pain in R ankle Baseline: has not resumed driving (eval) 08/07/4429   Not met  2 Pt will demo 5 deg of AROM of R ankle dorsiflexion to improve gait Baseline: 0 deg (eval); 15 deg 05/21/21 04/09/2021 Goal met   3 Pt will demo 10 deg of ankle inversion in R ankle to improve ability to pivot on R LE Baseline:2 deg (eval); 10 deg 05/07/21 04/09/2021  Goal met  4 Pt will be able to perform 5x sit to stand <12 sec without  HHA to improve functional strength of LE Baseline: 15 sec (eval) 04/09/2021 Not assessed  5.  Patient will demo 15 deg of R ankle ivnersion ROM to improve ankle ROM on uneven surfaces 10/22/2021     Not met  6. Pt will report 60 deg of cervical flexion and 40 deg of cervical extension with <2/10 pain (50 deg flexion and 35 deg extension with 8/10 pain in neck) 10/22/2021   Goal met 12/04/21    LONG TERM GOALS:    LTG Name Target Date Goal status  1 Pt will demo gait speed without AD that is >1 m/s to improve community ambulation Baseline:0.50ms with st. Cane (Eval); 0.964m without AD 06/20/21 02/12/2022     Goal met  2 Pt will be able to perform 10 unilateral heel raises to improve strength of plantarflexors in R LE Baseline: pt unable to perform standing unilateral heel raise (eva); 05/21/21: unable 05/21/21; 1 rep 06/20/21 02/12/2022  Revised 08/13/21  3 Pt will be able to ambulate 400 feet on grass without AD and without pain in ankle to improve community access Baseline: not attempted (eval); not attempted 05/21/21 02/12/2022  Goal met  4.  Pt will report overall 50% reduction in her neck pain to improve self management 02/26/22 Partially met 12/04/21  5.  Pt will report 15% improvement on NDI to improve self reported function (reported 4% improvement 12/04/21) 02/26/22 Partially met 12/04/21       PLAN: PT FREQUENCY: 2x/month  PT DURATION: other: 5 sessions  PLANNED INTERVENTIONS: Therapeutic exercises, Therapeutic activity, Neuromuscular re-education, Balance training, Gait training, Patient/Family education, Joint manipulation, Joint mobilization, Vestibular training, Canalith repositioning, Electrical stimulation, Spinal mobilization, Cryotherapy, Moist heat, Traction, and Manual therapy  PLAN FOR NEXT SESSION: Upper cervical mobilization   KaKerrie PleasurePT 01/08/2022, 12:24 PM

## 2022-01-14 ENCOUNTER — Ambulatory Visit (INDEPENDENT_AMBULATORY_CARE_PROVIDER_SITE_OTHER): Payer: 59 | Admitting: Family Medicine

## 2022-01-14 ENCOUNTER — Encounter (INDEPENDENT_AMBULATORY_CARE_PROVIDER_SITE_OTHER): Payer: Self-pay | Admitting: Family Medicine

## 2022-01-14 VITALS — BP 123/80 | HR 74 | Temp 98.4°F | Ht 67.0 in | Wt 242.0 lb

## 2022-01-14 DIAGNOSIS — E559 Vitamin D deficiency, unspecified: Secondary | ICD-10-CM

## 2022-01-14 DIAGNOSIS — Z6838 Body mass index (BMI) 38.0-38.9, adult: Secondary | ICD-10-CM

## 2022-01-14 DIAGNOSIS — E669 Obesity, unspecified: Secondary | ICD-10-CM

## 2022-01-14 MED ORDER — VITAMIN D (ERGOCALCIFEROL) 1.25 MG (50000 UNIT) PO CAPS: 50000.0000 [IU] | ORAL_CAPSULE | ORAL | 0 refills | Status: DC

## 2022-01-19 ENCOUNTER — Other Ambulatory Visit (INDEPENDENT_AMBULATORY_CARE_PROVIDER_SITE_OTHER): Payer: Self-pay | Admitting: Family Medicine

## 2022-01-19 DIAGNOSIS — E559 Vitamin D deficiency, unspecified: Secondary | ICD-10-CM

## 2022-01-20 NOTE — Progress Notes (Signed)
Chief Complaint:   OBESITY Veronica Hunter is here to discuss her progress with her obesity treatment plan along with follow-up of her obesity related diagnoses. Veronica Hunter is on the Category 3 Plan and states she is following her eating plan approximately 85% of the time. Veronica Hunter states she is doing more physical activity 15*-30 minutes 7 times per week.  Today's visit was #: 3 Starting weight: 244 lbs Starting date: 12/10/2021 Today's weight: 242 lbs Today's date: 01/14/2022 Total lbs lost to date: 2 lbs Total lbs lost since last in-office visit: 4  Interim History: Veronica Hunter followed meal plan very strictly for like 10 days and gained weight. Was feeling constipated and bloated. She then stopped following the plan and now feeling better. Plans to go to [facility] for Thanksgiving. Family will be coming in to celebrate Christmas.  Subjective:   1. Vitamin D deficiency Veronica Hunter is currently taking prescription Vit D 50,000 IU once a week. Vit D level of 27.5 at last reading.  Assessment/Plan:   1. Vitamin D deficiency We will refill Vit D 50K IU once a week for 1 month with 0 refills.   -Refill Vitamin D, Ergocalciferol, (DRISDOL) 1.25 MG (50000 UNIT) CAPS capsule; Take 1 capsule (50,000 Units total) by mouth every 7 (seven) days.  Dispense: 4 capsule; Refill: 0  2. Obesity with current BMI of 38.0 Veronica Hunter is currently in the action stage of change. As such, her goal is to continue with weight loss efforts. She has agreed to the Category 3 Plan and keeping a food journal and adhering to recommended goals of 200-300 & 300-400 calories and 25+ and 30+ protein.   Exercise goals: All adults should avoid inactivity. Some physical activity is better than none, and adults who participate in any amount of physical activity gain some health benefits.  Behavioral modification strategies: increasing lean protein intake, meal planning and cooking strategies, keeping healthy foods in the home, and  planning for success.  Veronica Hunter has agreed to follow-up with our clinic in 4 weeks. She was informed of the importance of frequent follow-up visits to maximize her success with intensive lifestyle modifications for her multiple health conditions.   Objective:   Blood pressure 123/80, pulse 74, temperature 98.4 F (36.9 C), height 5\' 7"  (1.702 m), weight 242 lb (109.8 kg), SpO2 98 %. Body mass index is 37.9 kg/m.  General: Cooperative, alert, well developed, in no acute distress. HEENT: Conjunctivae and lids unremarkable. Cardiovascular: Regular rhythm.  Lungs: Normal work of breathing. Neurologic: No focal deficits.   Lab Results  Component Value Date   CREATININE 0.78 12/10/2021   BUN 12 12/10/2021   NA 141 12/10/2021   K 4.6 12/10/2021   CL 101 12/10/2021   CO2 22 12/10/2021   Lab Results  Component Value Date   ALT 31 12/10/2021   AST 18 12/10/2021   ALKPHOS 91 12/10/2021   BILITOT 0.2 12/10/2021   Lab Results  Component Value Date   HGBA1C 5.5 12/10/2021   Lab Results  Component Value Date   INSULIN 90.5 (H) 12/10/2021   Lab Results  Component Value Date   TSH 3.130 12/10/2021   Lab Results  Component Value Date   CHOL 159 12/10/2021   HDL 39 (L) 12/10/2021   LDLCALC 102 (H) 12/10/2021   TRIG 94 12/10/2021   Lab Results  Component Value Date   VD25OH 27.5 (L) 12/10/2021   Lab Results  Component Value Date   WBC 5.5 12/10/2021   HGB 14.3  12/10/2021   HCT 42.7 12/10/2021   MCV 91 12/10/2021   PLT 198 12/10/2021   No results found for: "IRON", "TIBC", "FERRITIN"  Attestation Statements:   Reviewed by clinician on day of visit: allergies, medications, problem list, medical history, surgical history, family history, social history, and previous encounter notes.  I, Fortino Sic, RMA am acting as transcriptionist for Reuben Likes, MD.  I have reviewed the above documentation for accuracy and completeness, and I agree with the above. -  Reuben Likes, MD

## 2022-01-22 ENCOUNTER — Ambulatory Visit: Payer: 59

## 2022-02-04 ENCOUNTER — Ambulatory Visit: Payer: 59

## 2022-02-05 ENCOUNTER — Ambulatory Visit: Payer: 59

## 2022-02-10 ENCOUNTER — Encounter (INDEPENDENT_AMBULATORY_CARE_PROVIDER_SITE_OTHER): Payer: Self-pay | Admitting: Family Medicine

## 2022-02-10 ENCOUNTER — Ambulatory Visit (INDEPENDENT_AMBULATORY_CARE_PROVIDER_SITE_OTHER): Payer: 59 | Admitting: Family Medicine

## 2022-02-10 VITALS — BP 115/64 | HR 85 | Temp 98.2°F | Ht 67.0 in | Wt 243.0 lb

## 2022-02-10 DIAGNOSIS — Z6838 Body mass index (BMI) 38.0-38.9, adult: Secondary | ICD-10-CM | POA: Diagnosis not present

## 2022-02-10 DIAGNOSIS — E669 Obesity, unspecified: Secondary | ICD-10-CM

## 2022-02-10 DIAGNOSIS — E88819 Insulin resistance, unspecified: Secondary | ICD-10-CM | POA: Diagnosis not present

## 2022-02-10 DIAGNOSIS — E559 Vitamin D deficiency, unspecified: Secondary | ICD-10-CM | POA: Diagnosis not present

## 2022-02-10 DIAGNOSIS — E66812 Obesity, class 2: Secondary | ICD-10-CM

## 2022-02-10 MED ORDER — VITAMIN D (ERGOCALCIFEROL) 1.25 MG (50000 UNIT) PO CAPS: 50000.0000 [IU] | ORAL_CAPSULE | ORAL | 0 refills | Status: DC

## 2022-02-19 ENCOUNTER — Ambulatory Visit: Payer: 59

## 2022-02-24 NOTE — Progress Notes (Signed)
Chief Complaint:   OBESITY Veronica Hunter is here to discuss her progress with her obesity treatment plan along with follow-up of her obesity related diagnoses. Veronica Hunter is on the Category 2 Plan and keeping a food journal and adhering to recommended goals of 200-300 calories and 25+ grams of protein and states she is following her eating plan approximately 75% of the time. Veronica Hunter states she is exercising 15-30 minutes 5 times per week.  Today's visit was #: 4 Starting weight: 244 lbs Starting date: 12/10/2021 Today's weight: 243 lbs Today's date: 02/10/2022 Total lbs lost to date: 1 lb Total lbs lost since last in-office visit: 0  Interim History: Veronica Hunter has been traveling and working quite a bit. Trying to be mindful of nutrition of choices in terms of calorie and protein. When eating out she is trying to choose more nutritious options. Next few weeks she will be local. Goal is to maintain thru the holidays.  Subjective:   1. Vitamin D deficiency Some improvement in symptoms with supplementation. Denies any nausea, vomiting or muscle weakness. Notes fatigue.  2. Insulin resistance Veronica Hunter had significant elevation of Insulin on initial labs. Not on medication.  Assessment/Plan:   1. Vitamin D deficiency We will refill Vit D 50K IU once a week for 1 month with 0 refills.  -Refill Vitamin D, Ergocalciferol, (DRISDOL) 1.25 MG (50000 UNIT) CAPS capsule; Take 1 capsule (50,000 Units total) by mouth every 7 (seven) days.  Dispense: 4 capsule; Refill: 0  2. Insulin resistance Will repeat labs in February.  3. Obesity with current BMI of 38.2 Veronica Hunter is currently in the action stage of change. As such, her goal is to continue with weight loss efforts. She has agreed to the Category 3 Plan.   Exercise goals: All adults should avoid inactivity. Some physical activity is better than none, and adults who participate in any amount of physical activity gain some health  benefits.  Veronica Hunter working on trying to incorporate more movement.  Behavioral modification strategies: increasing lean protein intake, meal planning and cooking strategies, holiday eating strategies , celebration eating strategies, and keeping a strict food journal.  Veronica Hunter has agreed to follow-up with our clinic in 4 weeks. She was informed of the importance of frequent follow-up visits to maximize her success with intensive lifestyle modifications for her multiple health conditions.   Objective:   Blood pressure 115/64, pulse 85, temperature 98.2 F (36.8 C), height 5\' 7"  (1.702 m), weight 243 lb (110.2 kg), SpO2 95 %. Body mass index is 38.06 kg/m.  General: Cooperative, alert, well developed, in no acute distress. HEENT: Conjunctivae and lids unremarkable. Cardiovascular: Regular rhythm.  Lungs: Normal work of breathing. Neurologic: No focal deficits.   Lab Results  Component Value Date   CREATININE 0.78 12/10/2021   BUN 12 12/10/2021   NA 141 12/10/2021   K 4.6 12/10/2021   CL 101 12/10/2021   CO2 22 12/10/2021   Lab Results  Component Value Date   ALT 31 12/10/2021   AST 18 12/10/2021   ALKPHOS 91 12/10/2021   BILITOT 0.2 12/10/2021   Lab Results  Component Value Date   HGBA1C 5.5 12/10/2021   Lab Results  Component Value Date   INSULIN 90.5 (H) 12/10/2021   Lab Results  Component Value Date   TSH 3.130 12/10/2021   Lab Results  Component Value Date   CHOL 159 12/10/2021   HDL 39 (L) 12/10/2021   LDLCALC 102 (H) 12/10/2021   TRIG 94 12/10/2021  Lab Results  Component Value Date   VD25OH 27.5 (L) 12/10/2021   Lab Results  Component Value Date   WBC 5.5 12/10/2021   HGB 14.3 12/10/2021   HCT 42.7 12/10/2021   MCV 91 12/10/2021   PLT 198 12/10/2021   No results found for: "IRON", "TIBC", "FERRITIN"  Attestation Statements:   Reviewed by clinician on day of visit: allergies, medications, problem list, medical history, surgical history,  family history, social history, and previous encounter notes.  I, Elnora Morrison, RMA am acting as transcriptionist for Coralie Common, MD.  I have reviewed the above documentation for accuracy and completeness, and I agree with the above. - Coralie Common, MD

## 2022-02-25 ENCOUNTER — Ambulatory Visit: Payer: 59

## 2022-04-15 ENCOUNTER — Other Ambulatory Visit (INDEPENDENT_AMBULATORY_CARE_PROVIDER_SITE_OTHER): Payer: Self-pay | Admitting: Family Medicine

## 2022-04-15 DIAGNOSIS — E559 Vitamin D deficiency, unspecified: Secondary | ICD-10-CM

## 2022-07-21 ENCOUNTER — Ambulatory Visit: Payer: 59 | Admitting: Internal Medicine

## 2022-07-28 ENCOUNTER — Telehealth: Payer: Self-pay | Admitting: Internal Medicine

## 2022-07-28 NOTE — Telephone Encounter (Signed)
Good morning Dr. Leone Payor,   We received a referral for GERD for this patient. She has history with [facility] with one office visit in 2022. States she has not went back and does not wish to continue her care with them due to a not great experience. She is requesting her care specifically over to you because she states her husband, who has spoken with you before, reccommended you. She also states she has heard great things and believes she will be happy here. Her records are in [facility] for you to review and advise on scheduling.   Thank you.

## 2022-07-28 NOTE — Telephone Encounter (Signed)
Called patient to advise of appointment. Left VM.

## 2022-07-28 NOTE — Telephone Encounter (Signed)
I will accept her - schedule in next available open slot - follow-up or new  Looks like that is 7/25

## 2022-08-06 ENCOUNTER — Encounter (HOSPITAL_BASED_OUTPATIENT_CLINIC_OR_DEPARTMENT_OTHER): Payer: Self-pay | Admitting: Emergency Medicine

## 2022-08-06 ENCOUNTER — Emergency Department (HOSPITAL_BASED_OUTPATIENT_CLINIC_OR_DEPARTMENT_OTHER)
Admission: EM | Admit: 2022-08-06 | Discharge: 2022-08-06 | Disposition: A | Discharge status: Home / Self Care | Payer: 59 | Attending: Emergency Medicine | Admitting: Emergency Medicine

## 2022-08-06 ENCOUNTER — Emergency Department (HOSPITAL_BASED_OUTPATIENT_CLINIC_OR_DEPARTMENT_OTHER): Payer: 59 | Admitting: Radiology

## 2022-08-06 ENCOUNTER — Other Ambulatory Visit: Payer: Self-pay

## 2022-08-06 DIAGNOSIS — R079 Chest pain, unspecified: Secondary | ICD-10-CM | POA: Insufficient documentation

## 2022-08-06 LAB — CBC
HCT: 41.8 % (ref 36.0–46.0)
Hemoglobin: 13.9 g/dL (ref 12.0–15.0)
MCH: 29.6 pg (ref 26.0–34.0)
MCHC: 33.3 g/dL (ref 30.0–36.0)
MCV: 88.9 fL (ref 80.0–100.0)
Platelets: 194 10*3/uL (ref 150–400)
RBC: 4.7 MIL/uL (ref 3.87–5.11)
RDW: 12.5 % (ref 11.5–15.5)
WBC: 8.4 10*3/uL (ref 4.0–10.5)
nRBC: 0 % (ref 0.0–0.2)

## 2022-08-06 LAB — TROPONIN I (HIGH SENSITIVITY)
Troponin I (High Sensitivity): 4 ng/L (ref <18)
Troponin I (High Sensitivity): 5 ng/L (ref <18)

## 2022-08-06 LAB — BASIC METABOLIC PANEL
Anion gap: 10 (ref 5–15)
BUN: 18 mg/dL (ref 6–20)
CO2: 25 mmol/L (ref 22–32)
Calcium: 9.3 mg/dL (ref 8.9–10.3)
Chloride: 102 mmol/L (ref 98–111)
Creatinine, Ser: 0.7 mg/dL (ref 0.44–1.00)
GFR, Estimated: >60 mL/min (ref >60)
Glucose, Bld: 79 mg/dL (ref 70–99)
Potassium: 4.2 mmol/L (ref 3.5–5.1)
Sodium: 137 mmol/L (ref 135–145)

## 2022-08-06 LAB — HEPATIC FUNCTION PANEL
ALT: 22 U/L (ref 0–44)
AST: 17 U/L (ref 15–41)
Albumin: 4.7 g/dL (ref 3.5–5.0)
Alkaline Phosphatase: 65 U/L (ref 38–126)
Bilirubin, Direct: 0.1 mg/dL (ref 0.0–0.2)
Indirect Bilirubin: 0.3 mg/dL (ref 0.3–0.9)
Total Bilirubin: 0.4 mg/dL (ref 0.3–1.2)
Total Protein: 8.1 g/dL (ref 6.5–8.1)

## 2022-08-06 LAB — D-DIMER, QUANTITATIVE: D-Dimer, Quant: <0.27 ug/mL-FEU (ref 0.00–0.50)

## 2022-08-06 LAB — LIPASE, BLOOD: Lipase: 24 U/L (ref 11–51)

## 2022-08-06 MED ORDER — ALUM & MAG HYDROXIDE-SIMETH 200-200-20 MG/5ML PO SUSP
30.0000 mL | Freq: Once | ORAL | Status: AC
  Administered 2022-08-06: 30 mL via ORAL
  Filled 2022-08-06: qty 30

## 2022-08-06 NOTE — ED Triage Notes (Addendum)
Pt presents to ED POV. Pt c/o episode of CP and HTN. Pt reports that pain began while ambulating. Pt reports that episode began with flushed feeling then pain was middle of chest did not radiate towards arm, neck, or back. Pain is now mostly subsided. Pt reports similar episode happened earlier this week both times ~58m after she ate. Hx og refulx, takes prilosec.  EMS saw pt on scene given asa x4.  Pt reports that 3w ago she was taking prednisone and high dose of antibiotics and has not felt right since.

## 2022-08-06 NOTE — ED Provider Notes (Signed)
[facility] AT [facility] Provider Note   CSN: [id number] Arrival date & time: 08/06/22  1343     History  Chief Complaint  Patient presents with   Chest Pain    Veronica Hunter is a 52 y.o. female.  52 yo F with a chief complaint of chest pain.  This occurred a couple days ago and then recurred today.  Happened about an hour or 2 after she ate.  She described as an uncomfortable feeling.  Has had chronic reflux and does not feel like this is the same.  She feels like she is flushed.  Checked her blood pressure and it was elevated.  Not exertional no shortness of breath no diaphoresis no radiation of pain.  Patient denies history of MI, denies hypertension hyperlipidemia diabetes or smoking.  Mother had an MI in her 41s  Patient denies history of PE or DVT denies hemoptysis denies unilateral lower extremity edema denies recent surgery hospitalization estrogen use or history of cancer.  She did take a recent trip where she drove to both [place] and [place] and back.     Chest Pain      Home Medications Prior to Admission medications   Medication Sig Start Date End Date Taking? Authorizing Provider  ibuprofen (ADVIL) 600 MG tablet Take 600 mg by mouth as needed.    [provider]  Nutritional Supplements (NUTRITIONAL SUPPLEMENT PO) Take by mouth. Precision Supplements-2 capsules daily Acai Berry, Alpha-Lipoic Acid, Ashwagandha & Leaf Extract, Banaba Leaf Extract, Berberine Sulfate, Bilberry Extract, Biotin, Blueberry Extract, Capsicum Extract, Chromium    [provider]  Omega-3 Fatty Acids (OMEGA 3 PO) Take by mouth.    [provider]  Probiotic Product (PROBIOTIC BLEND PO) Take by mouth. 1 stick pack daily    [provider]  Vitamin D, Ergocalciferol, (DRISDOL) 1.25 MG (50000 UNIT) CAPS capsule Take 1 capsule (50,000 Units total) by mouth every 7 (seven) days. 02/10/22   Langston Reusing, MD       Allergies    Cephalosporins, Epinephrine, No healthtouch food allergies, and Sudafed [pseudoephedrine]    Review of Systems   Review of Systems  Cardiovascular:  Positive for chest pain.    Physical Exam Updated Vital Signs BP (!) 151/79   Pulse 76   Temp 98 F (36.7 C)   Resp 17   Ht 5\' 7"  (1.702 m)   Wt 108.9 kg   SpO2 100%   BMI 37.59 kg/m  Physical Exam Vitals and nursing note reviewed.  Constitutional:      General: She is not in acute distress.    Appearance: She is well-developed. She is not diaphoretic.  HENT:     Head: Normocephalic and atraumatic.  Eyes:     Pupils: Pupils are equal, round, and reactive to light.  Cardiovascular:     Rate and Rhythm: Normal rate and regular rhythm.     Heart sounds: No murmur heard.    No friction rub. No gallop.  Pulmonary:     Effort: Pulmonary effort is normal.     Breath sounds: No wheezing or rales.  Chest:     Chest wall: Tenderness present.     Comments: Mild parasternal discomfort. Abdominal:     General: There is no distension.     Palpations: Abdomen is soft.     Tenderness: There is no abdominal tenderness.  Musculoskeletal:        General: No tenderness.     Cervical back:  Normal range of motion and neck supple.  Skin:    General: Skin is warm and dry.  Neurological:     Mental Status: She is alert and oriented to person, place, and time.  Psychiatric:        Behavior: Behavior normal.     ED Results / Procedures / Treatments   Labs (all labs ordered are listed, but only abnormal results are displayed) Labs Reviewed  BASIC METABOLIC PANEL  CBC  HEPATIC FUNCTION PANEL  LIPASE, BLOOD  D-DIMER, QUANTITATIVE (NOT AT [facility])  TROPONIN I (HIGH SENSITIVITY)  TROPONIN I (HIGH SENSITIVITY)    EKG EKG Interpretation  Date/Time:  Thursday Aug 06 2022 13:54:14 EDT Ventricular Rate:  76 PR Interval:  154 QRS Duration: 92 QT Interval:  384 QTC Calculation: 432 R Axis:   4 Text  Interpretation: Normal sinus rhythm Inferior infarct , age undetermined Anteroseptal infarct , age undetermined Abnormal ECG No old tracing to compare Confirmed by Melene Plan [phone]) on 08/06/2022 3:11:40 PM  Radiology DG Chest 2 View  Result Date: 08/06/2022 CLINICAL DATA:  Chest pain EXAM: CHEST - 2 VIEW COMPARISON:  None Available. FINDINGS: No consolidation, pneumothorax or effusion. No edema. Normal cardiopericardial silhouette. Surgical clips in the right upper quadrant of the abdomen. IMPRESSION: No acute cardiopulmonary disease. Electronically Signed   By: Karen Kays M.D.   On: 08/06/2022 14:47    Procedures Procedures    Medications Ordered in ED Medications  alum & mag hydroxide-simeth (MAALOX/MYLANTA) 200-200-20 MG/5ML suspension 30 mL (30 mLs Oral Given 08/06/22 1652)    ED Course/ Medical Decision Making/ A&P                             Medical Decision Making Amount and/or Complexity of Data Reviewed Labs: ordered. Radiology: ordered.  Risk OTC drugs.   52 yo F with a chief complaint of chest pain.  Atypical in nature partially reproduced on exam going on for about 4 hours.  Will obtain a delta.  She recently drove to both [place] and [place].  Went back to the gym the day before symptoms started after a break.  Will obtain a D-dimer.  Reassess.  Ddimer negative, two trop negative.  No anemia, LFT and lipase unremarkable.  CXR independently interpreted by me without focal infiltrate or ptx.    D/c home.  PCP follow up.  8:48 PM:  I have discussed the diagnosis/risks/treatment options with the patient.  Evaluation and diagnostic testing in the emergency department does not suggest an emergent condition requiring admission or immediate intervention beyond what has been performed at this time.  They will follow up with PCP. We also discussed returning to the ED immediately if new or worsening sx occur. We discussed the sx which are most concerning (e.g., sudden worsening  pain, fever, inability to tolerate by mouth) that necessitate immediate return. Medications administered to the patient during their visit and any new prescriptions provided to the patient are listed below.  Medications given during this visit Medications  alum & mag hydroxide-simeth (MAALOX/MYLANTA) 200-200-20 MG/5ML suspension 30 mL (30 mLs Oral Given 08/06/22 1652)     The patient appears reasonably screen and/or stabilized for discharge and I doubt any other medical condition or other [facility] requiring further screening, evaluation, or treatment in the ED at this time prior to discharge.          Final Clinical Impression(s) / ED Diagnoses Final diagnoses:  Nonspecific  chest pain    Rx / DC Orders ED Discharge Orders     None         Melene Plan, DO 08/06/22 2048

## 2022-08-06 NOTE — Discharge Instructions (Signed)
Try to avoid things that may make this worse, most commonly these are spicy foods tomato based products fatty foods chocolate and peppermint.  Alcohol and tobacco can also make this worse.  Return to the emergency department for sudden worsening pain fever or inability to eat or drink. Follow up with your doctor in the office.

## 2022-08-17 ENCOUNTER — Ambulatory Visit (INDEPENDENT_AMBULATORY_CARE_PROVIDER_SITE_OTHER): Payer: 59 | Admitting: Nurse Practitioner

## 2022-08-17 ENCOUNTER — Encounter: Payer: Self-pay | Admitting: Nurse Practitioner

## 2022-08-17 VITALS — BP 122/64 | HR 89 | Ht 67.0 in | Wt 239.0 lb

## 2022-08-17 DIAGNOSIS — Z1211 Encounter for screening for malignant neoplasm of colon: Secondary | ICD-10-CM | POA: Diagnosis not present

## 2022-08-17 DIAGNOSIS — K219 Gastro-esophageal reflux disease without esophagitis: Secondary | ICD-10-CM

## 2022-08-17 DIAGNOSIS — R079 Chest pain, unspecified: Secondary | ICD-10-CM

## 2022-08-17 NOTE — Patient Instructions (Addendum)
We have sent over a referral to Cardiology.  Cardiac clearance is required prior to scheduling upper endoscopy & colonoscopy.  Famotidine 20 mg (over the counter- 1 by mouth daily  Due to recent changes in healthcare laws, you may see the results of your imaging and laboratory studies on MyChart before your provider has had a chance to review them.  We understand that in some cases there may be results that are confusing or concerning to you. Not all laboratory results come back in the same time frame and the provider may be waiting for multiple results in order to interpret others.  Please give [place] 48 hours in order for your provider to thoroughly review all the results before contacting the office for clarification of your results.   Thank you for trusting me with your gastrointestinal care!   Alcide Evener, CRNP

## 2022-08-17 NOTE — Progress Notes (Unsigned)
08/17/2022 Veronica Hunter [id number] 1970-11-29   CHIEF COMPLAINT: GERD  HISTORY OF PRESENT ILLNESS: Veronica Hunter is a 52 year old female with a past medical history of anxiety, hypertension, vitamin B12 deficiency, vitamin D deficiency, back pain and constipation. 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   She presents today   Son got married 4/25 Veronica Hunter Gone 2 1/2 weeks, contact dermatitis on Prednsione ? Major disruption beginning. Mond started  by Wed a virus antibiotic/amox/augment. Sinus URI.  Prednsone x 1 week. Augmetin x 10 days.   14 day window, urgnet care x 2, PCP x 1  Hoar ear pain sore thriat   800mg  Ibuprfone ankle pain 10,   Post prednisone, Aug, Pepcid, too one dose of Pantoprazole reflux less.  Deep green phlegm, coughing up. Made everkything mad.   2 weeks ago, acid reflux.   5/30 to draw bridge. BP shot up.  After she ate breakfast, felt hot, moist not sweat, upper abd dull pain would not go away, left shoulder pain, has had shoulder pain in the past. She took Benadryl.   Thursday, chest pain at church. BP high 911 to Draw bridge. Heart ok.   He was previously followed by GI with [facility] in 2022.  She elected to transition her GI management with [facility], she specifically requested Dr. Leone Payor.  She presented to the ED 08/06/2022 due to having chest pain which occurred 2 hours after eating. D-dimer was negative.  X-ray was negative.  She was discharged home.  EGD and colonoscopy  [facility]. 20 years ago.   CP after eating, does not occur when she exercises  Dizzy afte reat;ing .   Tues ate bana  Thurs  Chest pain sometimes every day, for a long day.   Mother with histoy of CAD in her 38's. Died MI 33's.   Heartburn spicy, once weekly No dysphagia  Famotidine 20mg  daily, not for the past 3 day.      Latest Ref Rng & Units 08/06/2022    2:03 PM 12/10/2021     9:45 AM  CBC  WBC 4.0 - 10.5 K/uL 8.4  5.5   Hemoglobin 12.0 - 15.0 g/dL 52.8  41.3   Hematocrit 36.0 - 46.0 % 41.8  42.7   Platelets 150 - 400 K/uL 194  198        Latest Ref Rng & Units 08/06/2022    2:03 PM 12/10/2021    9:45 AM  CMP  Glucose 70 - 99 mg/dL 79  91   BUN 6 - 20 mg/dL 18  12   Creatinine 2.44 - 1.00 mg/dL 0.10  2.72   Sodium 536 - 145 mmol/L 137  141   Potassium 3.5 - 5.1 mmol/L 4.2  4.6   Chloride 98 - 111 mmol/L 102  101   CO2 22 - 32 mmol/L 25  22   Calcium 8.9 - 10.3 mg/dL 9.3  9.1   Total Protein 6.5 - 8.1 g/dL 8.1  7.2   Total Bilirubin 0.3 - 1.2 mg/dL 0.4  0.2   Alkaline Phos 38 - 126 U/L 65  91   AST 15 - 41 U/L 17  18   ALT 0 - 44 U/L 22  31      Past Medical History:  Diagnosis Date   Anxiety    Back pain    Bilateral swelling of feet    Chest pain    Constipation    Dizziness  Fibromyalgia    Gallbladder problem    H/O fracture 2020   left foot, right ankle   Headache    Heartburn    High blood pressure    Indigestion    Joint pain    Multiple food allergies    Palpitations    Vitamin B12 deficiency    Vitamin D deficiency    Past Surgical History:  Procedure Laterality Date   GALLBLADDER SURGERY  1996   right ankle fracture surgery  2020   2 plates/10 screws    right ankle tendon repair  2010   TONSILLECTOMY  1996   TOTAL VAGINAL HYSTERECTOMY  2009   Social History:  Family History:    reports that she has never smoked. She has never used smokeless tobacco. She reports that she does not drink alcohol and does not use drugs. family history includes Cancer in her mother; Diabetes in her maternal grandmother and mother; Heart Problems in her mother; Heart attack in her mother; Heart disease in her mother; Hyperlipidemia in her mother; Hypertension in her mother; Sudden death in her father and mother. Allergies  Allergen Reactions   Cephalosporins    Epinephrine     "really bad racing heart" and "everything that's not  supposed to happen happens", occurs even with epinephrine in a numbing agent. Patient states years ago her dentist told her to put Epinephrine on her allergy list.   No Healthtouch Food Allergies     Mustard, Pork, and Chocolate   Sudafed [Pseudoephedrine]       Outpatient Encounter Medications as of 08/17/2022  Medication Sig   ibuprofen (ADVIL) 600 MG tablet Take 600 mg by mouth as needed.   Nutritional Supplements (NUTRITIONAL SUPPLEMENT PO) Take by mouth. Precision Supplements-2 capsules daily Acai Berry, Alpha-Lipoic Acid, Ashwagandha & Leaf Extract, Banaba Leaf Extract, Berberine Sulfate, Bilberry Extract, Biotin, Blueberry Extract, Capsicum Extract, Chromium   Omega-3 Fatty Acids (OMEGA 3 PO) Take by mouth.   Probiotic Product (PROBIOTIC BLEND PO) Take by mouth. 1 stick pack daily   Vitamin D, Ergocalciferol, (DRISDOL) 1.25 MG (50000 UNIT) CAPS capsule Take 1 capsule (50,000 Units total) by mouth every 7 (seven) days.   No facility-administered encounter medications on file as of 08/17/2022.     REVIEW OF SYSTEMS:  Gen: Denies fever, sweats or chills. No weight loss.  CV: Denies chest pain, palpitations or edema. Resp: Denies cough, shortness of breath of hemoptysis.  GI: Denies heartburn, dysphagia, stomach or lower abdominal pain. No diarrhea or constipation.  GU : Denies urinary burning, blood in urine, increased urinary frequency or incontinence. MS: Denies joint pain, muscles aches or weakness. Derm: Denies rash, itchiness, skin lesions or unhealing ulcers. Psych: Denies depression, anxiety, memory loss or confusion. Heme: Denies bruising, easy bleeding. Neuro:  Denies headaches, dizziness or paresthesias. Endo:  Denies any problems with DM, thyroid or adrenal function.  PHYSICAL EXAM: There were no vitals taken for this visit. General: in no acute distress. Head: Normocephalic and atraumatic. Eyes:  Sclerae non-icteric, conjunctive pink. Ears: Normal auditory  acuity. Mouth: Dentition intact. No ulcers or lesions.  Neck: Supple, no lymphadenopathy or thyromegaly.  Lungs: Clear bilaterally to auscultation without wheezes, crackles or rhonchi. Heart: Regular rate and rhythm. No murmur, rub or gallop appreciated.  Abdomen: Soft, nontender, nondistended. No masses. No hepatosplenomegaly. Normoactive bowel sounds x 4 quadrants.  Rectal:  Musculoskeletal: Symmetrical with no gross deformities. Skin: Warm and dry. No rash or lesions on visible extremities. Extremities: No edema. Neurological: Alert  oriented x 4, no focal deficits.  Psychological:  Alert and cooperative. Normal mood and affect.  ASSESSMENT AND PLAN:    CC:  Lazoff, Shawn P, DO

## 2022-08-18 DIAGNOSIS — K219 Gastro-esophageal reflux disease without esophagitis: Secondary | ICD-10-CM | POA: Insufficient documentation

## 2022-08-18 DIAGNOSIS — R079 Chest pain, unspecified: Secondary | ICD-10-CM | POA: Insufficient documentation

## 2022-08-26 ENCOUNTER — Encounter: Payer: Self-pay | Admitting: Internal Medicine

## 2022-08-26 ENCOUNTER — Ambulatory Visit (INDEPENDENT_AMBULATORY_CARE_PROVIDER_SITE_OTHER): Payer: 59 | Admitting: Internal Medicine

## 2022-08-26 VITALS — BP 128/88 | HR 84 | Temp 98.2°F | Resp 16 | Ht 67.25 in | Wt 237.0 lb

## 2022-08-26 DIAGNOSIS — T781XXA Other adverse food reactions, not elsewhere classified, initial encounter: Secondary | ICD-10-CM | POA: Diagnosis not present

## 2022-08-26 DIAGNOSIS — L501 Idiopathic urticaria: Secondary | ICD-10-CM | POA: Diagnosis not present

## 2022-08-26 DIAGNOSIS — K219 Gastro-esophageal reflux disease without esophagitis: Secondary | ICD-10-CM | POA: Diagnosis not present

## 2022-08-26 NOTE — Progress Notes (Signed)
New Patient Note  RE: Veronica Hunter MRN: [id number] DOB: 11-05-1970 Date of Office Visit: 08/26/2022  Consult requested by: Mattie Marlin, DO Primary care provider: Mattie Marlin, DO  Chief Complaint: Urticaria (X 2 years ) and GI Problem  History of Present Illness: I had the pleasure of seeing Veronica Hunter for initial evaluation at the [facility] on 08/26/2022. She is a 52 y.o. female, who is referred here by Lazoff, Shawn P, DO for the evaluation of urticaria .  History obtained from patient, chart review.  Symptoms started 2 years ago.  Occurs daily but will wax and wane and can go weeks without symptoms.  She was sick in May and treated with prednisone and symptoms worsened afterwards.  Sometimes triggered by stress, exercise.  Pictures consistent with urticaria.  Denies Dermatographism  Has not tried preventative antihistamines.  Occasionally has treated with Benadryl with good response.  Denies any associated food, medication or contact exposure.  Denies any association with sun, cold, heat, exercise, pressure, water or vibration exposure.  Lesions are not painful or fixed.  Denies any associated fevers or arthritis.  She has is been seen by GI for GERD, currently transfering care to [facility] with plan for upper and lower endoscopy.  On Pepcid and Protonix for GERD   Assessment and Plan: Veronica Hunter is a 52 y.o. female with: Idiopathic urticaria - Plan: Chronic Urticaria, Tryptase, TSH, Thyroid peroxidase antibody, Alpha-Gal Panel, CBC With Diff/Platelet, CMP14+EGFR, Sed Rate (ESR), Chocolate IgE, Allergen, Mustard, f89, Tomato IgE  Adverse food reaction, initial encounter  Gastroesophageal reflux disease, unspecified whether esophagitis present   Plan: Patient Instructions  Chronic Idiopathic Urticaria: - this is defined as hives lasting more than 6 weeks without an identifiable trigger - hives can be from a number of different sources including  infections, allergies, vibration, temperature, pressure among many others other possible causes - often an identifiable cause is not determined - some potential triggers include: stress, illness, NSAIDs, aspirin, hormonal changes - you do not have any red flag symptoms to make [place] concerned about secondary causes of hives, but we will screen for these for reassurance with: CBC w diff, CMP, tryptase, TSH, hive panel, alpha-gal panel, inflammatory markers -Will check chocolate, tomato, mustard, pork given previous reactions.  Although symptoms are more consistent with a food intolerance and food allergy - approximately 50% of patients with chronic hives can have some associated swelling of the face/lips/eyelids (this is not a cause for alarm and does not typically progress onto systemic allergic reactions)  Therapy Plan:  - start zyrtec (cetirizine) 10mg  once daily - if hives are uncontrolled, increase zyrtec (cetirizine) to 10mg  twice daily - if hives remain uncontrolled, increase dose of zyrtec (cetirizine) to max dose of 20mg  (2 pills) twice daily- this is maximum dose - can increase or decrease dosing depending on symptom control to a maximum dose of 4 tablets of antihistamine daily. Wait until hives free for at least one month prior to decreasing dose.   - if hives are still uncontrolled with the above regimen, please arrange an appointment for discussion of Xolair (omalizumab)- an injectable medication for hives  Can use one of the following in place of zyrtec if desires: Claritin (loratadine) 10 mg, Xyzal (levocetirizine) 5 mg or Allegra (fexofenadine) 180 mg daily as needed   GERD  -Continue dietary and lifestyle modifications -Continue Pepcid 20 mg daily as needed -Continue to follow up with  GI, consider H. pylori testing on  upcoming endoscopy  Follow up: 4 weeks   Thank you so much for letting me partake in your care today.  Don't hesitate to reach out if you have any additional  concerns!  Ferol Luz, MD  [facility] [place] [place]      No orders of the defined types were placed in this encounter.  Lab Orders         Chronic Urticaria         Tryptase         TSH         Thyroid peroxidase antibody         Alpha-Gal Panel         CBC With Diff/Platelet         CMP14+EGFR         Sed Rate (ESR)         Chocolate IgE         Allergen, Mustard, f89         Tomato IgE      Other allergy screening: Asthma: no Rhino conjunctivitis: no Food allergy:  migraines and nausea with chocolate, nausea and malaise with mustard, pork and tomato Medication allergy: yes Hymenoptera allergy: no Urticaria: yes Eczema:no History of recurrent infections suggestive of immunodeficency: no  Diagnostics: Skin Testing:  deferred due to [facility] insurance  .    Past Medical History: Patient Active Problem List   Diagnosis Date Noted   GERD (gastroesophageal reflux disease) 08/18/2022   Chest pain 08/18/2022   Difficulty in walking, not elsewhere classified 05/21/2021   Pain in left ankle and joints of left foot 05/21/2021   Myofascial pain syndrome, cervical 02/12/2021   Dizziness 04/07/2020   Cervicalgia 04/07/2020   Past Medical History:  Diagnosis Date   Anxiety    Back pain    Bilateral swelling of feet    Chest pain    Constipation    Dizziness    Fibromyalgia    Gallbladder problem    GERD (gastroesophageal reflux disease)    H/O fracture 2020   left foot, right ankle   Headache    Heartburn    High blood pressure    Indigestion    Joint pain    Multiple food allergies    Palpitations    Vitamin B12 deficiency    Vitamin D deficiency    Past Surgical History: Past Surgical History:  Procedure Laterality Date   GALLBLADDER SURGERY  1996   right ankle fracture surgery  2020   2 plates/10 screws    right ankle tendon repair  2010   TONSILLECTOMY  1996   TOTAL VAGINAL HYSTERECTOMY  2009   Medication List:  Current  Outpatient Medications  Medication Sig Dispense Refill   famotidine (PEPCID) 20 MG tablet Take 20 mg by mouth daily as needed for heartburn or indigestion.     hydrOXYzine (ATARAX) 25 MG tablet May take 1-2 tablets every 8 hours as needed for anxiety.     ibuprofen (ADVIL) 600 MG tablet Take 600 mg by mouth as needed.     pantoprazole (PROTONIX) 40 MG tablet Take by mouth.     Probiotic Product (PROBIOTIC BLEND PO) Take by mouth. 1 stick pack daily (Patient not taking: Reported on 08/26/2022)     triamcinolone cream (KENALOG) 0.1 % Apply topically 2 (two) times daily. (Patient not taking: Reported on 08/26/2022)     No current facility-administered medications for this visit.   Allergies: Allergies  Allergen Reactions  Cephalosporins    Epinephrine     "really bad racing heart" and "everything that's not supposed to happen happens", occurs even with epinephrine in a numbing agent. Patient states years ago her dentist told her to put Epinephrine on her allergy list.   Escitalopram Oxalate Other (See Comments)    Did not like the way it made her feel.   No Healthtouch Food Allergies     Mustard, Pork, and Chocolate   Sudafed [Pseudoephedrine]    Social History: Social History   Socioeconomic History   Marital status: Married    Spouse name: Not on file   Number of children: 2   Years of education: Not on file   Highest education level: Bachelor's degree (e.g., BA, AB, BS)  Occupational History   Occupation: Part time Music therapist  Tobacco Use   Smoking status: Never    Passive exposure: Never   Smokeless tobacco: Never  Vaping Use   Vaping Use: Never used  Substance and Sexual Activity   Alcohol use: Never   Drug use: Never   Sexual activity: Not on file  Other Topics Concern   Not on file  Social History Narrative   Lives at home with spouse & daughter   Right handed   Caffeine: 2 glasses of sweet tea/day   Social Determinants of Health   Financial Resource  Strain: Not on file  Food Insecurity: Not on file  Transportation Needs: Not on file  Physical Activity: Not on file  Stress: Not on file  Social Connections: Not on file   Lives in a single-family home that is 12 months old.  No roaches in the house and bed is 2 feet on the floor.  Dust mite precautions on bed but not pillows.  Not exposed to fumes, chemicals or dust.  No HEPA filter in the home and home is not near an [place] industrial area. Smoking: No exposure Occupation: Patient Set designer HistorySurveyor, minerals in the house: no Engineer, civil (consulting) in the family room: no Carpet in the bedroom: yes Heating: gas Cooling: central Pet: yes dog with access to bedroom  Family History: Family History  Problem Relation Age of Onset   Sinusitis Mother    Urticaria Mother    Allergic rhinitis Mother    Hyperlipidemia Mother    Hypertension Mother    Heart attack Mother    Cancer Mother        uterine possibly   Diabetes Mother    Heart Problems Mother    Heart disease Mother    Sudden death Mother    Sudden death Father    Allergic rhinitis Paternal Aunt    Sinusitis Paternal Aunt    Diabetes Maternal Grandmother    Sinusitis Son    Allergic rhinitis Son    Food Allergy Son    Migraines Neg Hx    Colon cancer Neg Hx    Esophageal cancer Neg Hx    Stomach cancer Neg Hx    Asthma Neg Hx    Immunodeficiency Neg Hx    Atopy Neg Hx    Angioedema Neg Hx    Eczema Neg Hx      ROS: All others negative except as noted per HPI.   Objective: BP 128/88   Pulse 84   Temp 98.2 F (36.8 C) (Temporal)   Resp 16   Ht 5' 7.25" (1.708 m)   Wt 237 lb (107.5 kg)   SpO2 97%   BMI 36.84 kg/m  Body mass index is 36.84 kg/m.  General Appearance:  Alert, cooperative, no distress, appears stated age  Head:  Normocephalic, without obvious abnormality, atraumatic  Eyes:  Conjunctiva clear, EOM's intact  Nose: Nares normal, normal mucosa, no visible anterior  polyps, and septum midline  Throat: Lips, tongue normal; teeth and gums normal, normal posterior oropharynx  Neck: Supple, symmetrical  Lungs:   clear to auscultation bilaterally, Respirations unlabored, no coughing  Heart:  regular rate and rhythm and no murmur, Appears well perfused  Extremities: No edema  Skin: Skin color, texture, turgor normal, no rashes or lesions on visualized portions of skin  Neurologic: No gross deficits   The plan was reviewed with the patient/family, and all questions/concerned were addressed.  It was my pleasure to see Veronica Hunter today and participate in her care. Please feel free to contact me with any questions or concerns.  Sincerely,  Ferol Luz, MD Allergy & Immunology  [facility] office: [phone] [facility] office: [phone]

## 2022-08-26 NOTE — Patient Instructions (Signed)
Chronic Idiopathic Urticaria: - this is defined as hives lasting more than 6 weeks without an identifiable trigger - hives can be from a number of different sources including infections, allergies, vibration, temperature, pressure among many others other possible causes - often an identifiable cause is not determined - some potential triggers include: stress, illness, NSAIDs, aspirin, hormonal changes - you do not have any red flag symptoms to make [place] concerned about secondary causes of hives, but we will screen for these for reassurance with: CBC w diff, CMP, tryptase, TSH, hive panel, alpha-gal panel, inflammatory markers -Will check chocolate, tomato, mustard, pork given previous reactions.  Although symptoms are more consistent with a food intolerance and food allergy - approximately 50% of patients with chronic hives can have some associated swelling of the face/lips/eyelids (this is not a cause for alarm and does not typically progress onto systemic allergic reactions)  Therapy Plan:  - start zyrtec (cetirizine) 10mg  once daily - if hives are uncontrolled, increase zyrtec (cetirizine) to 10mg  twice daily - if hives remain uncontrolled, increase dose of zyrtec (cetirizine) to max dose of 20mg  (2 pills) twice daily- this is maximum dose - can increase or decrease dosing depending on symptom control to a maximum dose of 4 tablets of antihistamine daily. Wait until hives free for at least one month prior to decreasing dose.   - if hives are still uncontrolled with the above regimen, please arrange an appointment for discussion of Xolair (omalizumab)- an injectable medication for hives  Can use one of the following in place of zyrtec if desires: Claritin (loratadine) 10 mg, Xyzal (levocetirizine) 5 mg or Allegra (fexofenadine) 180 mg daily as needed   GERD  -Continue dietary and lifestyle modifications -Continue Pepcid 20 mg daily as needed -Continue to follow up with  GI, consider H. pylori  testing on upcoming endoscopy  Follow up: 4 weeks   Thank you so much for letting me partake in your care today.  Don't hesitate to reach out if you have any additional concerns!  Ferol Luz, MD  [facility] [place] [place]

## 2022-09-01 LAB — CMP14+EGFR
ALT: 26 IU/L (ref 0–32)
Bilirubin Total: 0.3 mg/dL (ref 0.0–1.2)
Chloride: 100 mmol/L (ref 96–106)
Creatinine, Ser: 0.83 mg/dL (ref 0.57–1.00)
Glucose: 121 mg/dL — ABNORMAL HIGH (ref 70–99)
Potassium: 4.1 mmol/L (ref 3.5–5.2)
Total Protein: 7.4 g/dL (ref 6.0–8.5)

## 2022-09-01 LAB — CBC WITH DIFF/PLATELET
EOS (ABSOLUTE): 0.1 10*3/uL (ref 0.0–0.4)
Immature Grans (Abs): 0 10*3/uL (ref 0.0–0.1)
Lymphocytes Absolute: 1.6 10*3/uL (ref 0.7–3.1)
MCV: 91 fL (ref 79–97)
Monocytes Absolute: 0.4 10*3/uL (ref 0.1–0.9)
Monocytes: 4 %
Platelets: 194 10*3/uL (ref 150–450)
RDW: 12.2 % (ref 11.7–15.4)

## 2022-09-01 LAB — TSH: TSH: 2.4 u[IU]/mL (ref 0.450–4.500)

## 2022-09-01 LAB — ALLERGEN CHOCOLATE

## 2022-09-02 LAB — CBC WITH DIFF/PLATELET
Eos: 1 %
Hemoglobin: 13.8 g/dL (ref 11.1–15.9)
MCH: 29.1 pg (ref 26.6–33.0)
Neutrophils Absolute: 6.4 10*3/uL (ref 1.4–7.0)
Neutrophils: 75 %

## 2022-09-02 LAB — CHRONIC URTICARIA

## 2022-09-02 LAB — ALPHA-GAL PANEL

## 2022-09-02 LAB — CMP14+EGFR
Alkaline Phosphatase: 88 IU/L (ref 44–121)
BUN/Creatinine Ratio: 20 (ref 9–23)
CO2: 24 mmol/L (ref 20–29)
Calcium: 9.2 mg/dL (ref 8.7–10.2)
Globulin, Total: 3.1 g/dL (ref 1.5–4.5)

## 2022-09-02 LAB — THYROID PEROXIDASE ANTIBODY: Thyroperoxidase Ab SerPl-aCnc: 30 IU/mL (ref 0–34)

## 2022-09-03 LAB — CBC WITH DIFF/PLATELET
Basophils Absolute: 0 10*3/uL (ref 0.0–0.2)
Basos: 1 %
Hematocrit: 42.9 % (ref 34.0–46.6)
Lymphs: 19 %
RBC: 4.74 x10E6/uL (ref 3.77–5.28)
WBC: 8.5 10*3/uL (ref 3.4–10.8)

## 2022-09-03 LAB — ALPHA-GAL PANEL
Beef IgE: 0.12 kU/L — AB
IgE (Immunoglobulin E), Serum: 403 IU/mL (ref 6–495)
O215-IgE Alpha-Gal: <0.1 kU/L
Pork IgE: <0.1 kU/L

## 2022-09-03 LAB — TRYPTASE: Tryptase: 5.8 ug/L (ref 2.2–13.2)

## 2022-09-03 LAB — SEDIMENTATION RATE: Sed Rate: 20 mm/hr (ref 0–40)

## 2022-09-03 LAB — CMP14+EGFR
AST: 16 IU/L (ref 0–40)
Albumin: 4.3 g/dL (ref 3.8–4.9)
BUN: 17 mg/dL (ref 6–24)
Sodium: 140 mmol/L (ref 134–144)
eGFR: 85 mL/min/{1.73_m2} (ref >59)

## 2022-09-03 LAB — ALLERGEN, TOMATO F25: Allergen Tomato, IgE: <0.1 kU/L

## 2022-09-03 LAB — ALLERGEN, MUSTARD, F89: F089-IgE Mustard: <0.1 kU/L

## 2022-09-16 LAB — CBC WITH DIFF/PLATELET
Immature Granulocytes: 0 %
MCHC: 32.2 g/dL (ref 31.5–35.7)

## 2022-09-18 NOTE — Progress Notes (Signed)
I reviewed the bloodwork. Blood count, kidney function, liver function, electrolytes, thyroid, autoimmune screener, inflammation markers, chronic urticaria index (checks for autoantibodies that trigger mast cells), tryptase (checks for mast cell issues) and alpha gal (checks for red meat allergy) were all normal which is great.   Blood work to mustard, chocolate and tomato returned negative as well.  Can someone let patient know?

## 2022-09-22 ENCOUNTER — Other Ambulatory Visit: Payer: Self-pay | Admitting: Neurology

## 2022-09-30 ENCOUNTER — Ambulatory Visit: Payer: 59 | Admitting: Internal Medicine

## 2022-10-01 ENCOUNTER — Ambulatory Visit: Payer: 59 | Admitting: Internal Medicine

## 2022-10-05 NOTE — Progress Notes (Unsigned)
Cardiology Office Note:    Date:  10/06/2022   ID:  Veronica Hunter, DOB 1970/06/29, MRN [id number]  PCP:  Mattie Marlin, DO   [facility] Providers Cardiologist:  None     Referring MD: Mattie Marlin, DO   No chief complaint on file. Pre-OP  History of Present Illness:    Veronica Hunter is a 52 y.o. female with a hx of anxiety, fibromyalgia, she went to the ED in late May for Cp associated with eating. EKG showed inferior Q waves and anteroseptal Q waves as well.  CAC 2022 was zero. Referral for pre-op for endoscopy  She describes history of ?mitral valve prolapse. I have no records. She said she had a FU subsequently and she was told she did not have MVP.  She was evaluated in the ER 08/06/2022, where troponin levels, blood clot tests were normal. The patient experiences chest pain and palpitations after eating and she attributes to reflux. However does notice CP/SOB with climbing stairs as well.  She's had a CAC score of 0 in 2022.  The 10-year ASCVD risk score (Arnett DK, et al., 2019) is: 1.4%   Values used to calculate the score:     Age: 62 years     Sex: Female     Is Non-Hispanic African American: No     Diabetic: No     Tobacco smoker: No     Systolic Blood Pressure: 133 mmHg     Is BP treated: No     HDL Cholesterol: 49 mg/dL     Total Cholesterol: 162 mg/dL   Past Medical History:  Diagnosis Date   Anxiety    Back pain    Bilateral swelling of feet    Chest pain    Constipation    Dizziness    Fibromyalgia    Gallbladder problem    GERD (gastroesophageal reflux disease)    H/O fracture 2020   left foot, right ankle   Headache    Heartburn    High blood pressure    Indigestion    Joint pain    Multiple food allergies    Palpitations    Vitamin B12 deficiency    Vitamin D deficiency     Past Surgical History:  Procedure Laterality Date   GALLBLADDER SURGERY  1996   right ankle fracture surgery  2020   2 plates/10 screws    right  ankle tendon repair  2010   TONSILLECTOMY  1996   TOTAL VAGINAL HYSTERECTOMY  2009    Current Medications: Current Meds  Medication Sig   B Complex-Folic Acid (B COMPLEX VITAMINS, W/ FA,) CAPS Take 1 capsule by mouth daily.   cetirizine (ZYRTEC) 10 MG tablet Take 10 mg by mouth as needed.   Cholecalciferol 50 MCG (2000 UT) TABS Take 2,000 Units by mouth daily.   cyanocobalamin 100 MCG tablet Take 100 mcg by mouth daily.   famotidine (PEPCID) 20 MG tablet Take 20 mg by mouth daily as needed for heartburn or indigestion.   hydrOXYzine (ATARAX) 25 MG tablet May take 1-2 tablets every 8 hours as needed for anxiety.   ibuprofen (ADVIL) 800 MG tablet Take 800 mg by mouth 3 (three) times daily as needed.   pantoprazole (PROTONIX) 40 MG tablet Take by mouth.   Probiotic Product (PROBIOTIC BLEND PO) Take by mouth. 1 stick pack daily   triamcinolone cream (KENALOG) 0.1 % Apply topically 2 (two) times daily.     Allergies:  Cephalosporins, Epinephrine, Escitalopram oxalate, No healthtouch food allergies, and Sudafed [pseudoephedrine]   Social History   Socioeconomic History   Marital status: Married    Spouse name: Not on file   Number of children: 2   Years of education: Not on file   Highest education level: Bachelor's degree (e.g., BA, AB, BS)  Occupational History   Occupation: Part time Music therapist  Tobacco Use   Smoking status: Never    Passive exposure: Never   Smokeless tobacco: Never  Vaping Use   Vaping status: Never Used  Substance and Sexual Activity   Alcohol use: Never   Drug use: Never   Sexual activity: Not on file  Other Topics Concern   Not on file  Social History Narrative   Lives at home with spouse & daughter   Right handed   Caffeine: 2 glasses of sweet tea/day   Social Determinants of Health   Financial Resource Strain: Not on file  Food Insecurity: Low Risk  (07/27/2022)   [facility]   Food vital sign    Within  the past 12 months, you worried that your food would run out before you got money to buy more: Never true    Within the past 12 months, the food you bought just didn't last and you didn't have money to get more. : Never true  Transportation Needs: Not on file (07/27/2022)  Physical Activity: Not on file  Stress: Not on file  Social Connections: Not on file     Family History: The patient's family history includes Allergic rhinitis in her mother, paternal aunt, and son; Cancer in her mother; Diabetes in her maternal grandmother and mother; Food Allergy in her son; Heart Problems in her mother; Heart attack in her mother; Heart disease in her mother; Hyperlipidemia in her mother; Hypertension in her mother; Sinusitis in her mother, paternal aunt, and son; Sudden death in her father and mother; Urticaria in her mother. There is no history of Migraines, Colon cancer, Esophageal cancer, Stomach cancer, Asthma, Immunodeficiency, Atopy, Angioedema, or Eczema.  ROS:   Please see the history of present illness.     All other systems reviewed and are negative.  EKGs/Labs/Other Studies Reviewed:    The following studies were reviewed today:       Recent Labs: 08/31/2022: ALT 26; BUN 17; Creatinine, Ser 0.83; Hemoglobin 13.8; Platelets 194; Potassium 4.1; Sodium 140; TSH 2.400   Recent Lipid Panel    Component Value Date/Time   CHOL 159 12/10/2021 0945   TRIG 94 12/10/2021 0945   HDL 39 (L) 12/10/2021 0945   LDLCALC 102 (H) 12/10/2021 0945     Risk Assessment/Calculations:    Physical Exam:    VS:  Vitals:   10/06/22 0920  BP: 126/76  Pulse: 75  SpO2: 98%     Wt Readings from Last 3 Encounters:  08/26/22 237 lb (107.5 kg)  08/17/22 239 lb (108.4 kg)  08/06/22 240 lb (108.9 kg)     GEN:   Well nourished, well developed in no acute distress HEENT: Normal NECK: No JVD; No carotid bruits LYMPHATICS: No lymphadenopathy CARDIAC: RRR, no murmurs, rubs, gallops RESPIRATORY:   Clear to auscultation without rales, wheezing or rhonchi  ABDOMEN: Soft, non-tender, non-distended MUSCULOSKELETAL:  No edema; No deformity  SKIN: Warm and dry NEUROLOGIC:  Alert and oriented x 3 PSYCHIATRIC:  Normal affect   ASSESSMENT:    CP/PREOP - ddx c/f reflux ; however her 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 That being said here CAC was 0 in 2022. Overall, her CVD risk is low. - ACS was ruled out in the ED - will plan for stress test to help risk stratify  PLAN:    In order of problems listed above:  Exercise SPECT If her stress is low risk, she can proceed with endoscopy Follow up PRN Informed Consent   Shared Decision Making/Informed Consent The risks [chest pain, shortness of breath, cardiac arrhythmias, dizziness, blood pressure fluctuations, myocardial infarction, stroke/transient ischemic attack, nausea, vomiting, allergic reaction, radiation exposure, metallic taste sensation and life-threatening complications (estimated to be 1 in 10,000)], benefits (risk stratification, diagnosing coronary artery disease, treatment guidance) and alternatives of a nuclear stress test were discussed in detail with Ms. Kalish and she agrees to proceed.         Medication Adjustments/Labs and Tests Ordered: Current medicines are reviewed at length with the patient today.  Concerns regarding medicines are outlined above.  No orders of the defined types were placed in this encounter.  No orders of the defined types were placed in this encounter.   There are no Patient Instructions on file for this visit.   Signed, Maisie Fus, MD  10/06/2022 9:20 AM    [facility]

## 2022-10-06 ENCOUNTER — Ambulatory Visit: Payer: 59 | Attending: Internal Medicine | Admitting: Internal Medicine

## 2022-10-06 ENCOUNTER — Telehealth: Payer: Self-pay | Admitting: Internal Medicine

## 2022-10-06 VITALS — BP 126/76 | HR 75 | Ht 67.0 in | Wt 243.0 lb

## 2022-10-06 DIAGNOSIS — Z01818 Encounter for other preprocedural examination: Secondary | ICD-10-CM | POA: Diagnosis not present

## 2022-10-06 DIAGNOSIS — R079 Chest pain, unspecified: Secondary | ICD-10-CM

## 2022-10-06 NOTE — Telephone Encounter (Signed)
Patient would like to switch from Dr.Branch to Dr.Schumann, pt took first available with Dr.Branch to be able to get cardiac clearance for upcoming procedure, pt would like to see Dr.Schumann from here on out.

## 2022-10-06 NOTE — Patient Instructions (Signed)
Medication Instructions:  Your physician recommends that you continue on your current medications as directed. Please refer to the Current Medication list given to you today.  *If you need a refill on your cardiac medications before your next appointment, please call your pharmacy*   Lab Work: NONE If you have labs (blood work) drawn today and your tests are completely normal, you will receive your results only by: MyChart Message (if you have MyChart) OR A paper copy in the mail If you have any lab test that is abnormal or we need to change your treatment, we will call you to review the results.   Testing/Procedures: Your physician has requested that you have en exercise stress myoview. For further information please visit [email or web address] Please follow instruction sheet, as given.    Follow-Up: At [facility], you and your health needs are our priority.  As part of our continuing mission to provide you with exceptional heart care, we have created designated Provider Care Teams.  These Care Teams include your primary Cardiologist (physician) and Advanced Practice Providers (APPs -  Physician Assistants and Nurse Practitioners) who all work together to provide you with the care you need, when you need it.  We recommend signing up for the patient portal called "MyChart".  Sign up information is provided on this After Visit Summary.  MyChart is used to connect with patients for Virtual Visits (Telemedicine).  Patients are able to view lab/test results, encounter notes, upcoming appointments, etc.  Non-urgent messages can be sent to your provider as well.   To learn more about what you can do with MyChart, go to [email or web address].    Your next appointment:   AS NEEDED   Provider:   DR. [facility] BRANCH

## 2022-10-08 ENCOUNTER — Telehealth (HOSPITAL_COMMUNITY): Payer: Self-pay | Admitting: *Deleted

## 2022-10-08 NOTE — Telephone Encounter (Signed)
Per DPR left detailed instructions for MPI study.

## 2022-10-09 ENCOUNTER — Other Ambulatory Visit: Payer: Self-pay

## 2022-10-09 ENCOUNTER — Ambulatory Visit (INDEPENDENT_AMBULATORY_CARE_PROVIDER_SITE_OTHER): Payer: 59 | Admitting: Internal Medicine

## 2022-10-09 ENCOUNTER — Encounter: Payer: Self-pay | Admitting: Internal Medicine

## 2022-10-09 VITALS — BP 140/80 | HR 74 | Temp 98.6°F | Resp 12

## 2022-10-09 DIAGNOSIS — T781XXA Other adverse food reactions, not elsewhere classified, initial encounter: Secondary | ICD-10-CM

## 2022-10-09 DIAGNOSIS — L501 Idiopathic urticaria: Secondary | ICD-10-CM

## 2022-10-09 DIAGNOSIS — J3089 Other allergic rhinitis: Secondary | ICD-10-CM | POA: Diagnosis not present

## 2022-10-09 NOTE — Progress Notes (Unsigned)
Follow Up Note  RE: Veronica Hunter MRN: [id number] DOB: April 29, 1970 Date of Office Visit: 10/09/2022  Referring provider: Mattie Marlin, DO Primary care provider: Mattie Marlin, DO  Chief Complaint: No chief complaint on file.  History of Present Illness: I had the pleasure of seeing Veronica Hunter for a follow up visit at the [facility] on 10/09/2022. She is a 52 y.o. female, who is being followed for GERD, urticaria . Her previous allergy office visit was on 08/26/22 with Dr. Marlynn Perking. Today is a regular follow up visit.  History obtained from patient, chart review .  ***  Assessment and Plan: Veronica Hunter is a 52 y.o. female with: Idiopathic urticaria  Adverse food reaction, initial encounter - Plan: Allergy Panel 19, [employer], CANCELED: Allergy Panel 19, [employer]  Perennial allergic rhinitis - Plan: Allergens w/Total IgE Area 2, CANCELED: Allergens w/Total IgE Area 2  *** Plan: Patient Instructions  Chronic Idiopathic Urticaria:: controlled  -Will screen for cat allergy with blood work today  Therapy Plan:  - Continue zyrtec (cetirizine) 10mg  once daily as needed for symptoms  -Consider Allegra if Zyrtec causes side effects  Shellfish Allergy  - Will get blood work to screen for reintroduction today.   Food Elimination Diet Instructions - keep a food diary at least 2-4 weeks - make note if any foods consistently make symptoms worse  - if so, eliminate these foods from your diet for 2-4 weeks.   - If symptoms do not improve, stop food elimination diet.   - If symptoms do improve, then one by one reintroduce foods and make note if symptoms return.   - If they return following reintroduction, consider eliminating or reducing intake of each specific food causing symptoms.     GERD  -Continue dietary and lifestyle modifications -Continue Pepcid 20 mg daily as needed -Continue to follow up with  GI  Follow up: 6 month, we will call you  with lab results  Thank you so much for letting me partake in your care today.  Don't hesitate to reach out if you have any additional concerns!  Ferol Luz, MD  [facility] [place] [place]      No orders of the defined types were placed in this encounter.   Lab Orders         Allergens w/Total IgE Area 2         Allergy Panel 19, [employer]     Diagnostics:  None done   Medication List:  Current Outpatient Medications  Medication Sig Dispense Refill   B Complex-Folic Acid (B COMPLEX VITAMINS, W/ FA,) CAPS Take 1 capsule by mouth daily.     cetirizine (ZYRTEC) 10 MG tablet Take 10 mg by mouth as needed.     Cholecalciferol 50 MCG (2000 UT) TABS Take 2,000 Units by mouth daily.     cyanocobalamin 100 MCG tablet Take 100 mcg by mouth daily.     famotidine (PEPCID) 20 MG tablet Take 20 mg by mouth daily as needed for heartburn or indigestion.     hydrOXYzine (ATARAX) 25 MG tablet May take 1-2 tablets every 8 hours as needed for anxiety.     ibuprofen (ADVIL) 600 MG tablet Take 600 mg by mouth as needed. (Patient not taking: Reported on 10/06/2022)     ibuprofen (ADVIL) 800 MG tablet Take 800 mg by mouth 3 (three) times daily as needed.     pantoprazole (PROTONIX) 40 MG tablet  Take by mouth.     Probiotic Product (PROBIOTIC BLEND PO) Take by mouth. 1 stick pack daily     triamcinolone cream (KENALOG) 0.1 % Apply topically 2 (two) times daily.     No current facility-administered medications for this visit.   Allergies: Allergies  Allergen Reactions   Cephalosporins    Epinephrine     "really bad racing heart" and "everything that's not supposed to happen happens", occurs even with epinephrine in a numbing agent. Patient states years ago her dentist told her to put Epinephrine on her allergy list.   Escitalopram Oxalate Other (See Comments)    Did not like the way it made her feel.   No [facility] Food Allergies     Mustard, Pork, and Chocolate    Sudafed [Pseudoephedrine]    I reviewed her past medical history, social history, family history, and environmental history and no significant changes have been reported from her previous visit.  ROS: All others negative except as noted per HPI.   Objective: There were no vitals taken for this visit. There is no height or weight on file to calculate BMI. General Appearance:  Alert, cooperative, no distress, appears stated age  Head:  Normocephalic, without obvious abnormality, atraumatic  Eyes:  Conjunctiva clear, EOM's intact  Nose: Nares normal, {Blank multiple:19196:a:"***","hypertrophic turbinates","normal mucosa","no visible anterior polyps","septum midline"}  Throat: Lips, tongue normal; teeth and gums normal, {Blank multiple:19196:a:"***","normal posterior oropharynx","tonsils 2+","tonsils 3+","no tonsillar exudate","+ cobblestoning"}  Neck: Supple, symmetrical  Lungs:   {Blank multiple:19196:a:"***","clear to auscultation bilaterally","end-expiratory wheezing","wheezing throughout"}, Respirations unlabored, {Blank multiple:19196:a:"***","no coughing","intermittent dry coughing"}  Heart:  {Blank multiple:19196:a:"***","regular rate and rhythm","no murmur"}, Appears well perfused  Extremities: No edema  Skin: Skin color, texture, turgor normal, {Blank multiple:19196:a:""***","no rashes or lesions on visualized portions of skin"}  Neurologic: No gross deficits   Previous notes and tests were reviewed. The plan was reviewed with the patient/family, and all questions/concerned were addressed.  It was my pleasure to see Veronica Hunter today and participate in her care. Please feel free to contact me with any questions or concerns.  Sincerely,  Ferol Luz, MD  Allergy & Immunology  [facility] Office: [phone]

## 2022-10-09 NOTE — Patient Instructions (Addendum)
Chronic Idiopathic Urticaria:: controlled  -Will screen for cat allergy with blood work today  Therapy Plan:  - Continue zyrtec (cetirizine) 10mg  once daily as needed for symptoms  -Consider Allegra if Zyrtec causes side effects  Shellfish Allergy  - Will get blood work to screen for reintroduction today.   Food Elimination Diet Instructions - keep a food diary at least 2-4 weeks - make note if any foods consistently make symptoms worse  - if so, eliminate these foods from your diet for 2-4 weeks.   - If symptoms do not improve, stop food elimination diet.   - If symptoms do improve, then one by one reintroduce foods and make note if symptoms return.   - If they return following reintroduction, consider eliminating or reducing intake of each specific food causing symptoms.     GERD  -Continue dietary and lifestyle modifications -Continue Pepcid 20 mg daily as needed -Continue to follow up with  GI  Follow up: 6 month, we will call you with lab results  Thank you so much for letting me partake in your care today.  Don't hesitate to reach out if you have any additional concerns!  Ferol Luz, MD  [facility] [place] [place]

## 2022-10-13 ENCOUNTER — Ambulatory Visit (HOSPITAL_COMMUNITY): Payer: 59

## 2022-10-14 ENCOUNTER — Ambulatory Visit (HOSPITAL_COMMUNITY): Payer: 59

## 2022-10-21 ENCOUNTER — Other Ambulatory Visit: Payer: Self-pay

## 2022-10-21 MED ORDER — EPINEPHRINE 0.3 MG/0.3ML IJ SOAJ: 0.3000 mg | Freq: Once | INTRAMUSCULAR | 1 refills | Status: AC

## 2022-10-21 NOTE — Progress Notes (Signed)
Blood work was positive to DM, Cat, grass, dog, tree and cockroach.    Blood work was still positive to crab, shrimp and lobster.  Recommended continued avoidance.    Can someone let patient know?  Thanks!

## 2022-10-28 ENCOUNTER — Other Ambulatory Visit (HOSPITAL_COMMUNITY)
Admission: RE | Admit: 2022-10-28 | Discharge: 2022-10-28 | Disposition: A | Discharge status: Home / Self Care | Payer: 59 | Source: Ambulatory Visit | Attending: Radiology | Admitting: Radiology

## 2022-10-28 ENCOUNTER — Ambulatory Visit (INDEPENDENT_AMBULATORY_CARE_PROVIDER_SITE_OTHER): Payer: 59 | Admitting: Radiology

## 2022-10-28 ENCOUNTER — Encounter: Payer: Self-pay | Admitting: Radiology

## 2022-10-28 VITALS — BP 138/80 | Ht 66.75 in | Wt 242.0 lb

## 2022-10-28 DIAGNOSIS — Z79890 Hormone replacement therapy (postmenopausal): Secondary | ICD-10-CM

## 2022-10-28 DIAGNOSIS — L304 Erythema intertrigo: Secondary | ICD-10-CM

## 2022-10-28 DIAGNOSIS — Z1211 Encounter for screening for malignant neoplasm of colon: Secondary | ICD-10-CM

## 2022-10-28 DIAGNOSIS — Z01419 Encounter for gynecological examination (general) (routine) without abnormal findings: Secondary | ICD-10-CM

## 2022-10-28 DIAGNOSIS — N951 Menopausal and female climacteric states: Secondary | ICD-10-CM

## 2022-10-28 MED ORDER — ESTRADIOL 0.025 MG/24HR TD PTTW
1.0000 | MEDICATED_PATCH | TRANSDERMAL | 0 refills | Status: DC
Start: 2022-10-29 — End: 2023-01-21

## 2022-10-28 MED ORDER — CLOTRIMAZOLE-BETAMETHASONE 1-0.05 % EX CREA
1.0000 | TOPICAL_CREAM | Freq: Two times a day (BID) | CUTANEOUS | 0 refills | Status: DC
Start: 2022-10-28 — End: 2023-02-01

## 2022-10-28 NOTE — Progress Notes (Signed)
   Veronica Hunter 14-Jan-1971 [id number]   History:  52 y.o. G2P2 presents for annual exam as a new patient. Hysterectomy and BSO in 2009 for fibroids and endometriosis. Was treated with patch for years after, her former PCP told her she had to come off before age 36. Since then she reports weight gain 60lbs, brain fog, fatigue, trouble sleeping, joint pain in her shoulders and neck. She is interested in restarting. Also reports rash at crease of abdomen under pannus. Lotrimin helps but it has not resolved. She does have a PCP. Labs showed elevated glucose and insulin.   Gynecologic History Hysterectomy: 2009   Sexually active: yes  Health Maintenance Last Pap: 2009 Last mammogram: >5 years ago HRT MWN:UUVO  Past medical history, past surgical history, family history and social history were all reviewed and documented in the EPIC chart.  ROS:  A ROS was performed and pertinent positives and negatives are included.  Exam:  Vitals:   10/28/22 1122  BP: 138/80  Weight: 242 lb (109.8 kg)  Height: 5' 6.75" (1.695 m)   Body mass index is 38.19 kg/m.  General appearance:  Normal, obese Thyroid:  Symmetrical, normal in size, without palpable masses or nodularity. Respiratory  Auscultation:  Clear without wheezing or rhonchi Cardiovascular  Auscultation:  Regular rate, without rubs, murmurs or gallops  Edema/varicosities:  Not grossly evident Abdominal  Soft,nontender, without masses, guarding or rebound.  Liver/spleen:  No organomegaly noted  Hernia:  None appreciated  Skin  Inspection:  Grossly normal with a erythematous rash under left side of pannus 10cmx2cm in crease  Breasts: Examined lying and sitting.   Right: Without masses, retractions, nipple discharge or axillary adenopathy.   Left: Without masses, retractions, nipple discharge or axillary adenopathy. Genitourinary   Inguinal/mons:  Normal without inguinal adenopathy  External genitalia:  Normal appearing vulva with  no masses, tenderness, or lesions  BUS/Urethra/Skene's glands:  Normal  Vagina:  Normal appearing with normal color and discharge, no lesions. Atrophy mild  Cervix:  absent  Uterus:  absent  Adnexa/parametria:     Rt: Normal in size, without masses or tenderness.   Lt: Normal in size, without masses or tenderness.  Anus and perineum: Normal   Raynelle Fanning, CMA present for exam  Assessment/Plan:   1. Well woman exam with routine gynecological exam Pap, if normal may d/c paps Schedule mammogram Labs with PCP  2. Hormone replacement therapy (HRT) Will restart with low dose Risks and Benefits reviewed in detail Will follow up in 3 months for dose adjustment/increase - estradiol (VIVELLE-DOT) 0.025 MG/24HR; Place 1 patch onto the skin 2 (two) times a week.  Dispense: 24 patch; Refill: 0  3. Pruritic intertrigo Discussed prevention May use gold bond powder or cornstarch under pannus to prevent - clotrimazole-betamethasone (LOTRISONE) cream; Apply 1 Application topically 2 (two) times daily.  Dispense: 30 g; Refill: 0  4. Colon cancer screening - Cologuard  5. Class 2 severe obesity due to excess calories with serious comorbidity and body mass index (BMI) of 38.0 to 38.9 in adult (HCC) Glucose elevated last visit with PCP  Discussed SBE, colonoscopy and pap screening as appropriate. Encouraged 132mins/week of cardiovascular and weight bearing exercise minimum. Recommend the use of seatbelts and sunscreen consistently.   Return in 1 year for annual or sooner prn.  Arlie Solomons B WHNP-BC 12:29 PM 10/28/2022

## 2022-11-02 NOTE — Telephone Encounter (Signed)
Please advise if okay for pt to switch providers - pt scheduled to see Dr.Schumann in October per pt request

## 2022-11-03 NOTE — Telephone Encounter (Signed)
Okay with me 

## 2022-11-04 LAB — CYTOLOGY - PAP
Comment: NEGATIVE
Diagnosis: NEGATIVE
High risk HPV: NEGATIVE

## 2022-11-06 NOTE — Telephone Encounter (Signed)
Patient has appointment scheduled with Dr.Schumann 10/4 at 9:00 am.

## 2022-12-11 ENCOUNTER — Ambulatory Visit: Payer: 59 | Attending: Cardiology | Admitting: Cardiology

## 2022-12-11 ENCOUNTER — Ambulatory Visit: Payer: 59

## 2022-12-11 ENCOUNTER — Encounter: Payer: Self-pay | Admitting: Cardiology

## 2022-12-11 VITALS — BP 136/76 | HR 84 | Ht 67.0 in | Wt 245.0 lb

## 2022-12-11 DIAGNOSIS — R079 Chest pain, unspecified: Secondary | ICD-10-CM | POA: Diagnosis not present

## 2022-12-11 DIAGNOSIS — I341 Nonrheumatic mitral (valve) prolapse: Secondary | ICD-10-CM

## 2022-12-11 DIAGNOSIS — Z01818 Encounter for other preprocedural examination: Secondary | ICD-10-CM

## 2022-12-11 DIAGNOSIS — R002 Palpitations: Secondary | ICD-10-CM | POA: Diagnosis not present

## 2022-12-11 DIAGNOSIS — R030 Elevated blood-pressure reading, without diagnosis of hypertension: Secondary | ICD-10-CM

## 2022-12-11 MED ORDER — METOPROLOL TARTRATE 100 MG PO TABS: 100.0000 mg | ORAL_TABLET | Freq: Once | ORAL | 0 refills | Status: DC

## 2022-12-11 NOTE — Progress Notes (Signed)
Cardiology Office Note:    Date:  12/11/2022   ID:  Veronica Hunter, DOB August 22, 1970, MRN [id number]  PCP:  Mattie Marlin, DO  Cardiologist:  None  Electrophysiologist:  None   Referring MD: Mattie Marlin, DO   Chief Complaint  Patient presents with   Chest Pain    History of Present Illness:    Veronica Hunter is a 52 y.o. female with a hx of fibromyalgia, GERD, reported history of MVP who presents for follow-up.  She was seen by Dr. Wyline Mood for preop evaluation 10/06/2022.  She reported chest pain and palpitations.  Had been seen in the ED on 08/06/2022, workup unremarkable, including negative troponins.  She had a calcium score of 0 in 2022.  Exercise Myoview was ordered but was denied by insurance.  She reports has been having GI issues, planning EGD.  Has been having chest pain several times per week.  Reports pain is in center of lower chest, sometimes feels sharp and sometimes feels squeezing.  Does report it happens when she exerts herself, occurs with walking up stairs.  Also feels short of breath with exertion.  Also feels chest pain when having episodes of her heart racing.  Reports she will have episodes several times a week where heart feels like racing when she is lying down at night.  Can last up to 30 minutes.  No smoking history.  Family's includes mother had PCI in 83s and died of MI in 5s.   Past Medical History:  Diagnosis Date   Anxiety    Back pain    Bilateral swelling of feet    Chest pain    Constipation    Dizziness    Fibromyalgia    Gallbladder problem    GERD (gastroesophageal reflux disease)    H/O fracture 2020   left foot, right ankle   Headache    Heartburn    High blood pressure    Indigestion    Joint pain    Multiple food allergies    Palpitations    Vitamin B12 deficiency    Vitamin D deficiency     Past Surgical History:  Procedure Laterality Date   GALLBLADDER SURGERY  1996   right ankle fracture surgery  2020   2 plates/10  screws    right ankle tendon repair  2010   TONSILLECTOMY  1996   TOTAL VAGINAL HYSTERECTOMY  2009    Current Medications: Current Meds  Medication Sig   B Complex-Folic Acid (B COMPLEX VITAMINS, W/ FA,) CAPS Take 1 capsule by mouth daily.   cetirizine (ZYRTEC) 10 MG tablet Take 10 mg by mouth as needed.   Cholecalciferol 50 MCG (2000 UT) TABS Take 2,000 Units by mouth daily.   clotrimazole-betamethasone (LOTRISONE) cream Apply 1 Application topically 2 (two) times daily.   cyanocobalamin 100 MCG tablet Take 100 mcg by mouth daily.   estradiol (VIVELLE-DOT) 0.025 MG/24HR Place 1 patch onto the skin 2 (two) times a week.   famotidine (PEPCID) 20 MG tablet Take 20 mg by mouth daily as needed for heartburn or indigestion.   hydrOXYzine (ATARAX) 25 MG tablet May take 1-2 tablets every 8 hours as needed for anxiety.   ibuprofen (ADVIL) 800 MG tablet Take 800 mg by mouth 3 (three) times daily as needed.   MAGNESIUM PO Take by mouth.   metoprolol tartrate (LOPRESSOR) 100 MG tablet Take 1 tablet (100 mg total) by mouth once for 1 dose. 90 minutes to 2 hours prior to  scan   pantoprazole (PROTONIX) 40 MG tablet Take by mouth.   Probiotic Product (PROBIOTIC BLEND PO) Take by mouth. 1 stick pack daily     Allergies:   Cephalosporins, Epinephrine, Escitalopram oxalate, No healthtouch food allergies, and Sudafed [pseudoephedrine]   Social History   Socioeconomic History   Marital status: Married    Spouse name: Not on file   Number of children: 2   Years of education: Not on file   Highest education level: Bachelor's degree (e.g., BA, AB, BS)  Occupational History   Occupation: Part time Music therapist  Tobacco Use   Smoking status: Never    Passive exposure: Never   Smokeless tobacco: Never  Vaping Use   Vaping status: Never Used  Substance and Sexual Activity   Alcohol use: Yes    Comment: rare   Drug use: Never   Sexual activity: Yes    Partners: Male    Birth  control/protection: Surgical    Comment: hysterectomy, menarche 52yo, sexual debut 52yo  Other Topics Concern   Not on file  Social History Narrative   Lives at home with spouse & daughter   Right handed   Caffeine: 2 glasses of sweet tea/day   Social Determinants of Health   Financial Resource Strain: Not on file  Food Insecurity: Low Risk  (12/01/2022)   Received from [facility]   Hunger Vital Sign    Worried About Running Out of Food in the Last Year: Never true    Ran Out of Food in the Last Year: Never true  Transportation Needs: No Transportation Needs (12/01/2022)   Received from [facility]    In the past 12 months, has lack of reliable transportation kept you from medical appointments, meetings, work or from getting things needed for daily living? : No  Physical Activity: Not on file  Stress: Not on file  Social Connections: Not on file     Family History: The patient's family history includes Allergic rhinitis in her mother, paternal aunt, and son; Cancer in her mother; Diabetes in her maternal grandmother and mother; Food Allergy in her son; Heart Problems in her mother; Heart attack in her mother; Heart disease in her mother; Hyperlipidemia in her mother; Hypertension in her mother; Sinusitis in her mother, paternal aunt, and son; Sudden death in her father and mother; Urticaria in her mother. There is no history of Migraines, Colon cancer, Esophageal cancer, Stomach cancer, Asthma, Immunodeficiency, Atopy, Angioedema, or Eczema.  ROS:   Please see the history of present illness.     All other systems reviewed and are negative.  EKGs/Labs/Other Studies Reviewed:    The following studies were reviewed today:   EKG:   08/06/2022: Normal sinus rhythm, rate 76, Q waves in inferior leads and V1-4  Recent Labs: 08/31/2022: ALT 26; BUN 17; Creatinine, Ser 0.83; Hemoglobin 13.8; Platelets 194; Potassium 4.1; Sodium 140; TSH 2.400  Recent Lipid Panel     Component Value Date/Time   CHOL 159 12/10/2021 0945   TRIG 94 12/10/2021 0945   HDL 39 (L) 12/10/2021 0945   LDLCALC 102 (H) 12/10/2021 0945    Physical Exam:    VS:  BP 136/76   Pulse 84   Ht 5\' 7"  (1.702 m)   Wt 245 lb (111.1 kg)   SpO2 96%   BMI 38.37 kg/m     Wt Readings from Last 3 Encounters:  12/11/22 245 lb (111.1 kg)  10/28/22 242 lb (109.8 kg)  10/06/22 243 lb (110.2 kg)     GEN:  Well nourished, well developed in no acute distress HEENT: Normal NECK: No JVD; No carotid bruits LYMPHATICS: No lymphadenopathy CARDIAC: RRR, no murmurs, rubs, gallops RESPIRATORY:  Clear to auscultation without rales, wheezing or rhonchi  ABDOMEN: Soft, non-tender, non-distended MUSCULOSKELETAL:  No edema; No deformity  SKIN: Warm and dry NEUROLOGIC:  Alert and oriented x 3 PSYCHIATRIC:  Normal affect   ASSESSMENT:    1. Chest pain, unspecified type   2. Palpitations   3. Mitral valve prolapse   4. Pre-op evaluation   5. Elevated BP without diagnosis of hypertension    PLAN:    Chest pain: Description concerning for angina, she describes exertional chest tightness.  Exercise Myoview was ordered but denied by insurance for unclear reasons.  Recommend coronary CTA for further evaluation, will give Lopressor 100 mg prior to study  Palpitations: Description concerning for arrhythmia, will evaluate with Zio patch x 7 days  Preop evaluation: Prior to EGD; she is reporting exertional chest pain, will evaluate with coronary CTA as above  Mitral valve prolapse: Reported history, will check echocardiogram  Elevated BP without diagnosis of hypertension: She reports BP has been intermittently elevated when she checks at home.  She has been using wrist monitor.  BP okay in clinic today.  Recommend obtaining upper arm Omron monitor and checking BP twice daily for next week and letting us know results  RTC in 3 months  Medication Adjustments/Labs and Tests Ordered: Current medicines  are reviewed at length with the patient today.  Concerns regarding medicines are outlined above.  Orders Placed This Encounter  Procedures   CT CORONARY MORPH W/CTA COR W/SCORE W/CA W/CM &/OR WO/CM   Basic metabolic panel   Lipid panel   LONG TERM MONITOR (3-14 DAYS)   ECHOCARDIOGRAM COMPLETE   Meds ordered this encounter  Medications   metoprolol tartrate (LOPRESSOR) 100 MG tablet    Sig: Take 1 tablet (100 mg total) by mouth once for 1 dose. 90 minutes to 2 hours prior to scan    Dispense:  1 tablet    Refill:  0    Patient Instructions  Medication Instructions:  Lopressor 100 mg ( Take 90 minutes to 2 hours prior to CT Scan). *If you need a refill on your cardiac medications before your next appointment, please call your pharmacy*   Lab Work: BMET, Lipid Panel.  Today If you have labs (blood work) drawn today and your tests are completely normal, you will receive your results only by: MyChart Message (if you have MyChart) OR A paper copy in the mail If you have any lab test that is abnormal or we need to change your treatment, we will call you to review the results.   Testing/Procedures: [address] Your physician has requested that you have an echocardiogram. Echocardiography is a painless test that uses sound waves to create images of your heart. It provides your doctor with information about the size and shape of your heart and how well your heart's chambers and valves are working. This procedure takes approximately one hour. There are no restrictions for this procedure. Please do NOT wear cologne, perfume, aftershave, or lotions (deodorant is allowed). Please arrive 15 minutes prior to your appointment time.     Your cardiac CT will be scheduled at one of the below locations:   [facility] [address] [place], [place] [address] [phone]  OR  [facility] [address] [place], [place] [address] [phone]  OR   [facility] [address] [place], [place] [address] [phone]  If scheduled at [facility], please arrive at the [facility] and Children's Entrance (Entrance C2) of [facility] 30 minutes prior to test start time. You can use the FREE valet parking offered at entrance C (encouraged to control the heart rate for the test)  Proceed to the [facility] Department (first floor) to check-in and test prep.  All radiology patients and guests should use entrance C2 at [facility], accessed from [facility], even though the hospital's physical address listed is [address]    If scheduled at [facility] or [facility], please arrive 15 mins early for check-in and test prep.  There is spacious parking and easy access to the radiology department from the [facility] entrance. Please enter here and check-in with the desk attendant.   Please follow these instructions carefully (unless otherwise directed):  An IV will be required for this test and Nitroglycerin will be given.   On the Night Before the Test: Be sure to Drink plenty of water. Do not consume any caffeinated/decaffeinated beverages or chocolate 12 hours prior to your test. Do not take any antihistamines 12 hours prior to your test. If the patient has contrast allergy: Patient will need a prescription for Prednisone and very clear instructions (as follows): Prednisone 50 mg - take 13 hours prior to test Take another Prednisone 50 mg 7 hours prior to test Take another Prednisone 50 mg 1 hour prior to test Take Benadryl 50 mg 1 hour prior to test Patient must complete all four doses of above prophylactic medications. Patient will need a ride after test due to Benadryl.  On the Day of the Test: Drink plenty of water until 1 hour prior to the  test. Do not eat any food 1 hour prior to test. You may take your regular medications prior to the test.  Take metoprolol (Lopressor) two hours prior to test. If you take Furosemide/Hydrochlorothiazide/Spironolactone, please HOLD on the morning of the test. FEMALES- please wear underwire-free bra if available, avoid dresses & tight clothing  After the Test: Drink plenty of water. After receiving IV contrast, you may experience a mild flushed feeling. This is normal. On occasion, you may experience a mild rash up to 24 hours after the test. This is not dangerous. If this occurs, you can take Benadryl 25 mg and increase your fluid intake. If you experience trouble breathing, this can be serious. If it is severe call 911 IMMEDIATELY. If it is mild, please call our office. If you take any of these medications: Glipizide/Metformin, Avandament, Glucavance, please do not take 48 hours after completing test unless otherwise instructed.  We will call to schedule your test 2-4 weeks out understanding that some insurance companies will need an authorization prior to the service being performed.   For more information and frequently asked questions, please visit our website : [email or web address]  For non-scheduling related questions, please contact the cardiac imaging nurse navigator should you have any questions/concerns: Cardiac Imaging Nurse Navigators Direct Office Dial: [phone]   For scheduling needs, including cancellations and rescheduling, please call [place], [phone].   ZIO XT- Long Term Monitor Instructions  Your physician has requested you wear a ZIO patch monitor for 7 days.  This is a single  patch monitor. [employer] supplies one patch monitor per enrollment. Additional stickers are not available. Please do not apply patch if you will be having a Nuclear Stress Test,  Echocardiogram, Cardiac CT, MRI, or Chest Xray during the period you would be wearing the   monitor. The patch cannot be worn during these tests. You cannot remove and re-apply the  ZIO XT patch monitor.  Your ZIO patch monitor will be mailed 3 day USPS to your address on file. It may take 3-5 days  to receive your monitor after you have been enrolled.  Once you have received your monitor, please review the enclosed instructions. Your monitor  has already been registered assigning a specific monitor serial # to you.  Billing and Patient Assistance Program Information  We have supplied [employer] with any of your insurance information on file for billing purposes. [employer] offers a sliding scale Patient Assistance Program for patients that do not have  insurance, or whose insurance does not completely cover the cost of the ZIO monitor.  You must apply for the Patient Assistance Program to qualify for this discounted rate.  To apply, please call [employer] at [phone], select option 4, select option 2, ask to apply for  Patient Assistance Program. Meredeth Ide will ask your household income, and how many people  are in your household. They will quote your out-of-pocket cost based on that information.  [employer] will also be able to set up a 53-month, interest-free payment plan if needed.  Applying the monitor   Shave hair from upper left chest.  Hold abrader disc by orange tab. Rub abrader in 40 strokes over the upper left chest as  indicated in your monitor instructions.  Clean area with 4 enclosed alcohol pads. Let dry.  Apply patch as indicated in monitor instructions. Patch will be placed under collarbone on left  side of chest with arrow pointing upward.  Rub patch adhesive wings for 2 minutes. Remove white label marked "1". Remove the white  label marked "2". Rub patch adhesive wings for 2 additional minutes.  While looking in a mirror, press and release button in center of patch. A small green light will  flash 3-4 times. This will be your only indicator that the monitor has been  turned on.  Do not shower for the first 24 hours. You may shower after the first 24 hours.  Press the button if you feel a symptom. You will hear a small click. Record Date, Time and  Symptom in the Patient Logbook.  When you are ready to remove the patch, follow instructions on the last 2 pages of Patient  Logbook. Stick patch monitor onto the last page of Patient Logbook.  Place Patient Logbook in the blue and white box. Use locking tab on box and tape box closed  securely. The blue and white box has prepaid postage on it. Please place it in the mailbox as  soon as possible. Your physician should have your test results approximately 7 days after the  monitor has been mailed back to [facility].  Call [facility] Customer Care at [phone] if you have questions regarding  your ZIO XT patch monitor. Call them immediately if you see an orange light blinking on your  monitor.  If your monitor falls off in less than 4 days, contact our Monitor department at [phone].  If your monitor becomes loose or falls off after 4 days call [employer] at [phone] for  suggestions on securing your monitor   Follow-Up: At  [facility], you and your health needs are our priority.  As part of our continuing mission to provide you with exceptional heart care, we have created designated Provider Care Teams.  These Care Teams include your primary Cardiologist (physician) and Advanced Practice Providers (APPs -  Physician Assistants and Nurse Practitioners) who all work together to provide you with the care you need, when you need it.  Your next appointment:   3-4 month(s)  Provider:   Epifanio Lesches, MD   Other Instructions Recommend Omron Blood Pressure Cuff.    Signed, Little Ishikawa, MD  12/11/2022 2:30 PM    [facility]

## 2022-12-11 NOTE — Patient Instructions (Signed)
Medication Instructions:  Lopressor 100 mg ( Take 90 minutes to 2 hours prior to CT Scan). *If you need a refill on your cardiac medications before your next appointment, please call your pharmacy*   Lab Work: BMET, Lipid Panel.  Today If you have labs (blood work) drawn today and your tests are completely normal, you will receive your results only by: MyChart Message (if you have MyChart) OR A paper copy in the mail If you have any lab test that is abnormal or we need to change your treatment, we will call you to review the results.   Testing/Procedures: [address], [address]. Your physician has requested that you have an echocardiogram. Echocardiography is a painless test that uses sound waves to create images of your heart. It provides your doctor with information about the size and shape of your heart and how well your heart's chambers and valves are working. This procedure takes approximately one hour. There are no restrictions for this procedure. Please do NOT wear cologne, perfume, aftershave, or lotions (deodorant is allowed). Please arrive 15 minutes prior to your appointment time.     Your cardiac CT will be scheduled at one of the below locations:   [facility] [address] [place], [place] [address] [phone]  OR  [facility] [address] [place], [place] [address] [phone]  OR   [facility] [address] [place], [place] [address] [phone]  If scheduled at [facility], please arrive at the [facility] En[facility] (Entrance C2) of [facility] 30 minutes prior to test start time. You can use the FREE valet parking offered at entrance C (encouraged to control the heart rate for the test)  Proceed to the [facility] Radiology Department (first floor) to check-in and test prep.  All radiology patients and guests should use  entrance C2 at [facility], accessed from [facility], even though the hospital's physical address listed is [address].    If scheduled at [facility] or [facility], please arrive 15 mins early for check-in and test prep.  There is spacious parking and easy access to the radiology department from the [facility] entrance. Please enter here and check-in with the desk attendant.   Please follow these instructions carefully (unless otherwise directed):  An IV will be required for this test and Nitroglycerin will be given.   On the Night Before the Test: Be sure to Drink plenty of water. Do not consume any caffeinated/decaffeinated beverages or chocolate 12 hours prior to your test. Do not take any antihistamines 12 hours prior to your test. If the patient has contrast allergy: Patient will need a prescription for Prednisone and very clear instructions (as follows): Prednisone 50 mg - take 13 hours prior to test Take another Prednisone 50 mg 7 hours prior to test Take another Prednisone 50 mg 1 hour prior to test Take Benadryl 50 mg 1 hour prior to test Patient must complete all four doses of above prophylactic medications. Patient will need a ride after test due to Benadryl.  On the Day of the Test: Drink plenty of water until 1 hour prior to the test. Do not eat any food 1 hour prior to test. You may take your regular medications prior to the test.  Take metoprolol (Lopressor) two hours prior to test. If you take Furosemide/Hydrochlorothiazide/Spironolactone, please HOLD  on the morning of the test. FEMALES- please wear underwire-free bra if available, avoid dresses & tight clothing  After the Test: Drink plenty of water. After receiving IV contrast, you may experience a mild flushed feeling. This is normal. On occasion, you may experience a mild rash up to 24 hours after the test. This is  not dangerous. If this occurs, you can take Benadryl 25 mg and increase your fluid intake. If you experience trouble breathing, this can be serious. If it is severe call 911 IMMEDIATELY. If it is mild, please call our office. If you take any of these medications: Glipizide/Metformin, Avandament, Glucavance, please do not take 48 hours after completing test unless otherwise instructed.  We will call to schedule your test 2-4 weeks out understanding that some insurance companies will need an authorization prior to the service being performed.   For more information and frequently asked questions, please visit our website : [email or web address]  For non-scheduling related questions, please contact the cardiac imaging nurse navigator should you have any questions/concerns: Cardiac Imaging Nurse Navigators Direct Office Dial: [phone]   For scheduling needs, including cancellations and rescheduling, please call [place], [phone].   ZIO XT- Long Term Monitor Instructions  Your physician has requested you wear a ZIO patch monitor for 7 days.  This is a single patch monitor. [employer] supplies one patch monitor per enrollment. Additional stickers are not available. Please do not apply patch if you will be having a Nuclear Stress Test,  Echocardiogram, Cardiac CT, MRI, or Chest Xray during the period you would be wearing the  monitor. The patch cannot be worn during these tests. You cannot remove and re-apply the  ZIO XT patch monitor.  Your ZIO patch monitor will be mailed 3 day USPS to your address on file. It may take 3-5 days  to receive your monitor after you have been enrolled.  Once you have received your monitor, please review the enclosed instructions. Your monitor  has already been registered assigning a specific monitor serial # to you.  Billing and Patient Assistance Program Information  We have supplied [employer] with any of your insurance information on file for  billing purposes. [employer] offers a sliding scale Patient Assistance Program for patients that do not have  insurance, or whose insurance does not completely cover the cost of the ZIO monitor.  You must apply for the Patient Assistance Program to qualify for this discounted rate.  To apply, please call [employer] at [phone], select option 4, select option 2, ask to apply for  Patient Assistance Program. Meredeth Ide will ask your household income, and how many people  are in your household. They will quote your out-of-pocket cost based on that information.  [employer] will also be able to set up a 47-month, interest-free payment plan if needed.  Applying the monitor   Shave hair from upper left chest.  Hold abrader disc by orange tab. Rub abrader in 40 strokes over the upper left chest as  indicated in your monitor instructions.  Clean area with 4 enclosed alcohol pads. Let dry.  Apply patch as indicated in monitor instructions. Patch will be placed under collarbone on left  side of chest with arrow pointing upward.  Rub patch adhesive wings for 2 minutes. Remove white label marked "1". Remove the white  label marked "2". Rub patch adhesive wings for 2 additional minutes.  While looking in a mirror, press and release button in center of patch. A small green light will  flash 3-4  times. This will be your only indicator that the monitor has been turned on.  Do not shower for the first 24 hours. You may shower after the first 24 hours.  Press the button if you feel a symptom. You will hear a small click. Record Date, Time and  Symptom in the Patient Logbook.  When you are ready to remove the patch, follow instructions on the last 2 pages of Patient  Logbook. Stick patch monitor onto the last page of Patient Logbook.  Place Patient Logbook in the blue and white box. Use locking tab on box and tape box closed  securely. The blue and white box has prepaid postage on it. Please place it in the mailbox  as  soon as possible. Your physician should have your test results approximately 7 days after the  monitor has been mailed back to [facility].  Call [facility] Customer Care at [phone] if you have questions regarding  your ZIO XT patch monitor. Call them immediately if you see an orange light blinking on your  monitor.  If your monitor falls off in less than 4 days, contact our Monitor department at [phone].  If your monitor becomes loose or falls off after 4 days call [employer] at [phone] for  suggestions on securing your monitor   Follow-Up: At [facility], you and your health needs are our priority.  As part of our continuing mission to provide you with exceptional heart care, we have created designated Provider Care Teams.  These Care Teams include your primary Cardiologist (physician) and Advanced Practice Providers (APPs -  Physician Assistants and Nurse Practitioners) who all work together to provide you with the care you need, when you need it.  Your next appointment:   3-4 month(s)  Provider:   Epifanio Lesches, MD   Other Instructions Recommend [employer] Blood Pressure Cuff.

## 2022-12-11 NOTE — Progress Notes (Unsigned)
Enrolled patient for a 7 day Zio XT monitor to be mailed to patients home.  

## 2022-12-12 ENCOUNTER — Other Ambulatory Visit: Payer: Self-pay | Admitting: Cardiology

## 2022-12-12 LAB — LIPID PANEL
Chol/HDL Ratio: 3.9 {ratio} (ref 0.0–4.4)
Cholesterol, Total: 158 mg/dL (ref 100–199)
HDL: 41 mg/dL (ref >39)
LDL Chol Calc (NIH): 100 mg/dL — ABNORMAL HIGH (ref 0–99)
Triglycerides: 90 mg/dL (ref 0–149)
VLDL Cholesterol Cal: 17 mg/dL (ref 5–40)

## 2022-12-12 LAB — BASIC METABOLIC PANEL
BUN/Creatinine Ratio: 18 (ref 9–23)
BUN: 13 mg/dL (ref 6–24)
CO2: 27 mmol/L (ref 20–29)
Calcium: 9.4 mg/dL (ref 8.7–10.2)
Chloride: 102 mmol/L (ref 96–106)
Creatinine, Ser: 0.72 mg/dL (ref 0.57–1.00)
Glucose: 89 mg/dL (ref 70–99)
Potassium: 4.4 mmol/L (ref 3.5–5.2)
Sodium: 141 mmol/L (ref 134–144)
eGFR: 100 mL/min/{1.73_m2} (ref >59)

## 2022-12-13 ENCOUNTER — Other Ambulatory Visit: Payer: Self-pay | Admitting: Cardiology

## 2022-12-21 ENCOUNTER — Encounter (HOSPITAL_COMMUNITY): Payer: Self-pay

## 2022-12-23 ENCOUNTER — Ambulatory Visit (HOSPITAL_COMMUNITY): Admission: RE | Admit: 2022-12-23 | Payer: 59 | Source: Ambulatory Visit

## 2022-12-25 ENCOUNTER — Encounter (HOSPITAL_COMMUNITY): Payer: Self-pay

## 2022-12-25 ENCOUNTER — Other Ambulatory Visit: Payer: Self-pay

## 2022-12-25 DIAGNOSIS — R079 Chest pain, unspecified: Secondary | ICD-10-CM

## 2022-12-25 DIAGNOSIS — K219 Gastro-esophageal reflux disease without esophagitis: Secondary | ICD-10-CM

## 2022-12-25 NOTE — Telephone Encounter (Signed)
Veronica Hunter, pls contact patient and schedule her for a RUQ sonogram to evaluate the gallbladder and send her to our lab for a CBC, CMP and lipase level. If she continues to have heartburn and/or epigastric pain, pls send in RX for Pantoprazole 40mg  every day to be taken 30 minutes before breakfast # 30, 1 refill and she can take the previously prescribed Famotidine at bed time. Patient to go to the ED if she has CP. THX.

## 2022-12-25 NOTE — Telephone Encounter (Signed)
Order completed and to [facility] to review and sign for faxing.

## 2022-12-28 ENCOUNTER — Other Ambulatory Visit: Payer: Self-pay

## 2022-12-28 NOTE — Telephone Encounter (Signed)
Veronica Hunter, pls contact the patient and let her know I received her message. I did review that she had her gallbladder out when I initially saw patient in office. My request for a RUQ sonogram should have stated that I recommend a RUQ sonogram to evaluate her common bile duct to make sure that she doesn't have any evidence of stones in the bile duct which could result in chest and upper abdominal pain. The labs are to assess her white and red blood cell counts, liver enzymes and lipase levels. Further recommendations to be determined after results reviewed.   Pls correct the RUQ sono order to include rule out choledocholithiasis. THX.

## 2022-12-28 NOTE — Addendum Note (Signed)
Addended by: Emeline Darling on: 12/28/2022 03:44 PM   Modules accepted: Orders

## 2022-12-28 NOTE — Telephone Encounter (Signed)
Pt made aware of Alcide Evener NP recommendations. Order was changed. [place] was scheduled for 01/04/2023 at 8:00 AM at [facility]  Pt to arrive at 7:45 AM Pt made aware.  Pt to remain NPO past midnight. Pt made aware.  Pt notified to come and have labs done. Location provided. Pt verbalized understanding with all questions answered.

## 2022-12-29 ENCOUNTER — Ambulatory Visit (HOSPITAL_COMMUNITY): Admission: RE | Admit: 2022-12-29 | Payer: 59 | Source: Ambulatory Visit

## 2022-12-30 ENCOUNTER — Other Ambulatory Visit (HOSPITAL_COMMUNITY): Payer: 59

## 2023-01-01 ENCOUNTER — Ambulatory Visit (HOSPITAL_COMMUNITY): Payer: 59 | Attending: Cardiology

## 2023-01-01 DIAGNOSIS — R002 Palpitations: Secondary | ICD-10-CM | POA: Diagnosis present

## 2023-01-01 DIAGNOSIS — R079 Chest pain, unspecified: Secondary | ICD-10-CM | POA: Diagnosis present

## 2023-01-01 DIAGNOSIS — I341 Nonrheumatic mitral (valve) prolapse: Secondary | ICD-10-CM | POA: Diagnosis present

## 2023-01-01 LAB — ECHOCARDIOGRAM COMPLETE
Area-P 1/2: 3.42 cm2
S' Lateral: 2.9 cm

## 2023-01-04 ENCOUNTER — Ambulatory Visit (HOSPITAL_COMMUNITY)
Admission: RE | Admit: 2023-01-04 | Discharge: 2023-01-04 | Disposition: A | Discharge status: Home / Self Care | Payer: 59 | Source: Ambulatory Visit | Attending: Nurse Practitioner | Admitting: Nurse Practitioner

## 2023-01-04 DIAGNOSIS — K219 Gastro-esophageal reflux disease without esophagitis: Secondary | ICD-10-CM | POA: Diagnosis present

## 2023-01-04 DIAGNOSIS — R079 Chest pain, unspecified: Secondary | ICD-10-CM | POA: Insufficient documentation

## 2023-01-08 ENCOUNTER — Encounter (HOSPITAL_COMMUNITY): Payer: Self-pay

## 2023-01-12 ENCOUNTER — Encounter (HOSPITAL_COMMUNITY): Payer: Self-pay

## 2023-01-12 ENCOUNTER — Ambulatory Visit (HOSPITAL_COMMUNITY)
Admission: RE | Admit: 2023-01-12 | Discharge: 2023-01-12 | Disposition: A | Discharge status: Home / Self Care | Payer: 59 | Source: Ambulatory Visit | Attending: Cardiology | Admitting: Cardiology

## 2023-01-12 DIAGNOSIS — R079 Chest pain, unspecified: Secondary | ICD-10-CM | POA: Diagnosis present

## 2023-01-12 DIAGNOSIS — I2510 Atherosclerotic heart disease of native coronary artery without angina pectoris: Secondary | ICD-10-CM

## 2023-01-12 MED ORDER — NITROGLYCERIN 0.4 MG SL SUBL
0.8000 mg | SUBLINGUAL_TABLET | Freq: Once | SUBLINGUAL | Status: AC
  Administered 2023-01-12: 0.8 mg via SUBLINGUAL

## 2023-01-12 MED ORDER — NITROGLYCERIN 0.4 MG SL SUBL
SUBLINGUAL_TABLET | SUBLINGUAL | Status: AC
  Filled 2023-01-12: qty 2

## 2023-01-12 MED ORDER — IOHEXOL 350 MG/ML SOLN
95.0000 mL | Freq: Once | INTRAVENOUS | Status: AC | PRN
  Administered 2023-01-12: 95 mL via INTRAVENOUS

## 2023-01-14 ENCOUNTER — Other Ambulatory Visit: Payer: Self-pay

## 2023-01-14 ENCOUNTER — Telehealth: Payer: Self-pay

## 2023-01-14 DIAGNOSIS — I341 Nonrheumatic mitral (valve) prolapse: Secondary | ICD-10-CM

## 2023-01-14 MED ORDER — ATORVASTATIN CALCIUM 20 MG PO TABS: 20.0000 mg | ORAL_TABLET | Freq: Every day | ORAL | 3 refills | Status: DC

## 2023-01-14 NOTE — Telephone Encounter (Signed)
Spoke to Pt about the results of her coronary CTA. See result note.

## 2023-01-14 NOTE — Telephone Encounter (Signed)
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  Recommend starting atorvastatin 20 mg daily and check fasting lipid panel in 2 months

## 2023-01-21 ENCOUNTER — Other Ambulatory Visit: Payer: Self-pay | Admitting: Radiology

## 2023-01-21 DIAGNOSIS — Z79890 Hormone replacement therapy (postmenopausal): Secondary | ICD-10-CM

## 2023-01-21 NOTE — Telephone Encounter (Signed)
Med refill request: estradiol 0.025 mg patch Last AEX: 10/28/22 (needed 3 month HRT follow up) Next OV:  02/01/23 Last MMG (if hormonal med) > 5 years Refill authorized: estradiol 0.025 mg #24 no rf. Sent to provider for approval or denial as appropriate.

## 2023-01-27 LAB — COLOGUARD: COLOGUARD: NEGATIVE

## 2023-02-01 ENCOUNTER — Ambulatory Visit (INDEPENDENT_AMBULATORY_CARE_PROVIDER_SITE_OTHER): Payer: 59 | Admitting: Radiology

## 2023-02-01 ENCOUNTER — Encounter: Payer: Self-pay | Admitting: Radiology

## 2023-02-01 VITALS — BP 122/82 | HR 93 | Wt 245.0 lb

## 2023-02-01 DIAGNOSIS — Z79890 Hormone replacement therapy (postmenopausal): Secondary | ICD-10-CM

## 2023-02-01 MED ORDER — ESTRADIOL 0.0375 MG/24HR TD PTTW: 1.0000 | MEDICATED_PATCH | TRANSDERMAL | 10 refills | Status: DC

## 2023-02-01 NOTE — Progress Notes (Signed)
   Veronica Hunter 03/03/1971 [id number]   History:  52 y.o. G3P2 presents for follow up after restart estradiol patch. She is doing well. Still having muscle aches but it has improved. Dryness has improved slightly. Is not exercising regularly. Added soft drinks back in her diet. Noticed some left breast pain recently.Cardiologist recommended she start a statin to negate her risks after finding plaque build up. Unsure that she wants to start that as she is nervous about the side effects.  Gynecologic History Hysterectomy   Past medical history, past surgical history, family history and social history were all reviewed and documented in the EPIC chart.  ROS:  A ROS was performed and pertinent positives and negatives are included.  Exam:  Vitals:   02/01/23 1450  BP: 122/82  Pulse: 93  SpO2: 97%  Weight: 245 lb (111.1 kg)   Body mass index is 38.37 kg/m.  Physical Exam Constitutional:      Appearance: Normal appearance. She is obese.  Pulmonary:     Effort: Pulmonary effort is normal.  Neurological:     Mental Status: She is alert.  Psychiatric:        Mood and Affect: Mood normal.        Thought Content: Thought content normal.        Judgment: Judgment normal.      Assessment/Plan:   1. Hormone replacement therapy (HRT) Will increase dose slowly at patient request- will let me know in 3 months if she is ready to increase, if not will continue current dosing. Begin regular exercise, increase protein Consider starting a statin as cardiologist recommended Breast pain likely from adding in extra caffeine- eliminate sodas - estradiol (VIVELLE-DOT) 0.0375 MG/24HR; Place 1 patch onto the skin 2 (two) times a week.  Dispense: 8 patch; Refill: 10     Vester Titsworth B WHNP-BC 3:37 PM 02/01/2023

## 2023-02-09 ENCOUNTER — Telehealth: Payer: 59 | Admitting: Physician Assistant

## 2023-02-09 ENCOUNTER — Encounter: Payer: Self-pay | Admitting: Cardiology

## 2023-02-09 DIAGNOSIS — G8929 Other chronic pain: Secondary | ICD-10-CM

## 2023-02-09 DIAGNOSIS — M25512 Pain in left shoulder: Secondary | ICD-10-CM

## 2023-02-09 DIAGNOSIS — M542 Cervicalgia: Secondary | ICD-10-CM

## 2023-02-09 MED ORDER — CELECOXIB 100 MG PO CAPS: 100.0000 mg | ORAL_CAPSULE | Freq: Two times a day (BID) | ORAL | 0 refills | Status: DC

## 2023-02-09 MED ORDER — TIZANIDINE HCL 4 MG PO TABS: 4.0000 mg | ORAL_TABLET | Freq: Three times a day (TID) | ORAL | 0 refills | Status: DC | PRN

## 2023-02-09 NOTE — Patient Instructions (Signed)
Evangeline Gula, thank you for joining [name], PA-C for today's virtual visit.  While this provider is not your primary care provider (PCP), if your PCP is located in our provider database this encounter information will be shared with them immediately following your visit.   A [facility] MyChart account gives you access to today's visit and all your visits, tests, and labs performed at [facility] " click here if you don't have a [facility] MyChart account or go to [email or web address]  Consent: (Patient) Veronica Hunter provided verbal consent for this virtual visit at the beginning of the encounter.  Current Medications:  Current Outpatient Medications:    celecoxib (CELEBREX) 100 MG capsule, Take 1 capsule (100 mg total) by mouth 2 (two) times daily., Disp: 30 capsule, Rfl: 0   tiZANidine (ZANAFLEX) 4 MG tablet, Take 1 tablet (4 mg total) by mouth every 8 (eight) hours as needed for muscle spasms., Disp: 30 tablet, Rfl: 0   atorvastatin (LIPITOR) 20 MG tablet, Take 1 tablet (20 mg total) by mouth daily. (Patient not taking: Reported on 02/01/2023), Disp: 90 tablet, Rfl: 3   B Complex-Folic Acid (B COMPLEX VITAMINS, W/ FA,) CAPS, Take 1 capsule by mouth daily. (Patient not taking: Reported on 02/01/2023), Disp: , Rfl:    cetirizine (ZYRTEC) 10 MG tablet, Take 10 mg by mouth as needed., Disp: , Rfl:    Cholecalciferol 50 MCG (2000 UT) TABS, Take 2,000 Units by mouth daily. (Patient not taking: Reported on 02/01/2023), Disp: , Rfl:    cyanocobalamin 100 MCG tablet, Take 100 mcg by mouth daily. (Patient not taking: Reported on 02/01/2023), Disp: , Rfl:    doxycycline (VIBRAMYCIN) 100 MG capsule, Take 100 mg by mouth 2 (two) times daily as needed. (Patient not taking: Reported on 02/01/2023), Disp: , Rfl:    EPINEPHrine 0.3 mg/0.3 mL IJ SOAJ injection, ADMINISTER 0.3 MG IN THE MUSCLE 1 TIME FOR 1 DOSE (Patient not taking: Reported on 02/01/2023), Disp: , Rfl:     estradiol (VIVELLE-DOT) 0.0375 MG/24HR, Place 1 patch onto the skin 2 (two) times a week., Disp: 8 patch, Rfl: 10   famotidine (PEPCID) 20 MG tablet, Take 20 mg by mouth daily as needed for heartburn or indigestion., Disp: , Rfl:    hydrOXYzine (ATARAX) 25 MG tablet, May take 1-2 tablets every 8 hours as needed for anxiety. (Patient not taking: Reported on 02/01/2023), Disp: , Rfl:    ibuprofen (ADVIL) 800 MG tablet, Take 800 mg by mouth 3 (three) times daily as needed., Disp: , Rfl:    MAGNESIUM PO, Take by mouth., Disp: , Rfl:    metroNIDAZOLE (METROGEL) 0.75 % gel, Apply topically., Disp: , Rfl:    pantoprazole (PROTONIX) 40 MG tablet, Take by mouth. (Patient not taking: Reported on 02/01/2023), Disp: , Rfl:    Probiotic Product (PROBIOTIC BLEND PO), Take by mouth. 1 stick pack daily, Disp: , Rfl:    Medications ordered in this encounter:  Meds ordered this encounter  Medications   tiZANidine (ZANAFLEX) 4 MG tablet    Sig: Take 1 tablet (4 mg total) by mouth every 8 (eight) hours as needed for muscle spasms.    Dispense:  30 tablet    Refill:  0    Order Specific Question:   Supervising Provider    Answer:   Merrilee Jansky [id number]   celecoxib (CELEBREX) 100 MG capsule    Sig: Take 1 capsule (100 mg total) by mouth 2 (two) times daily.  Dispense:  30 capsule    Refill:  0    Order Specific Question:   Supervising Provider    Answer:   Merrilee Jansky [id number]     *If you need refills on other medications prior to your next appointment, please contact your pharmacy*  Follow-Up: Call back or seek an in-person evaluation if the symptoms worsen or if the condition fails to improve as anticipated.  [facility] [phone]  Other Instructions  Stop OTC Ibuprofen. Take the Celebrex and Tizanidine as directed, when needed. Avoid heavy lifting and overexertion. Be mindful of positions that seem to make the issue worse. Follow-up with your regular providers  for ongoing evaluation and management.   If you have been instructed to have an in-person evaluation today at a local Urgent Care facility, please use the link below. It will take you to a list of all of our available [facility], including address, phone number and hours of operation. Please do not delay care.  [facility]  If you or a family member do not have a primary care provider, use the link below to schedule a visit and establish care. When you choose a [facility] primary care physician or advanced practice provider, you gain a long-term partner in health. Find a Primary Care Provider  Learn more about [facility]'s in-office and virtual care options: [facility] - Get Care Now

## 2023-02-09 NOTE — Progress Notes (Signed)
Virtual Visit Consent   Veronica Hunter, you are scheduled for a virtual visit with a  provider today. Just as with appointments in the office, your consent must be obtained to participate. Your consent will be active for this visit and any virtual visit you may have with one of our providers in the next 365 days. If you have a MyChart account, a copy of this consent can be sent to you electronically.  As this is a virtual visit, video technology does not allow for your provider to perform a traditional examination. This may limit your provider's ability to fully assess your condition. If your provider identifies any concerns that need to be evaluated in person or the need to arrange testing (such as labs, EKG, etc.), we will make arrangements to do so. Although advances in technology are sophisticated, we cannot ensure that it will always work on either your end or our end. If the connection with a video visit is poor, the visit may have to be switched to a telephone visit. With either a video or telephone visit, we are not always able to ensure that we have a secure connection.  By engaging in this virtual visit, you consent to the provision of healthcare and authorize for your insurance to be billed (if applicable) for the services provided during this visit. Depending on your insurance coverage, you may receive a charge related to this service.  I need to obtain your verbal consent now. Are you willing to proceed with your visit today? [name] has provided verbal consent on 02/09/2023 for a virtual visit (video or telephone). Veronica Hunter, [place]  Date: 02/09/2023 4:29 PM  Virtual Visit via Video Note   I, Veronica Hunter, connected with  [name]  ([id number], [birth year]) on 02/09/23 at  3:45 PM EST by a video-enabled telemedicine application and verified that I am speaking with the correct person using two identifiers.  Location: Patient: Virtual Visit Location  Patient: Home Provider: Virtual Visit Location Provider: Home Office   I discussed the limitations of evaluation and management by telemedicine and the availability of in person appointments. The patient expressed understanding and agreed to proceed.    History of Present Illness: Veronica Hunter is a 52 y.o. who identifies as a female who was assigned female at birth, and is being seen today for flare of her ongoing neck, shoulder and jaw pain. Patient notes ongoing issue with bilateral shoulders and neck, L>R with some worsening pain of L neck/jaw over the past several days. Notes pain is constant and aching. Is sometimes alleviated with pressing in certain areas of her neck, collarbone, etc., and at other times that and movement make symptoms worse. Symptoms are non exertional and are not associated with diaphoreses, palpitations, shortness of breath or presyncope. She has had evaluated recently with her Cardiologist as a precaution, having underwent a CTAMorphology with Calcium Score on 11/25 revealing mild non obstructive CAD and no discernible cardiac cause of patient's symptoms. Most recently was evaluated by Rheumatology after being referred from her orthopedist as symptoms continued despite trigger point injections and she was noted to have positive ANA. Notes further testing from Rheumatologist was unremarkable but has not had another follow-up visit yet. Is taking Ibuprofen 800 mg OTC throughout the day with little relief. Does not take her muscle relaxant and unsure if she has any more. Notes she is allergic to prednisone now as it causes a rash.  HPI: HPI  Problems:  Patient Active  Problem List   Diagnosis Date Noted   GERD (gastroesophageal reflux disease) 08/18/2022   Chest pain 08/18/2022   Difficulty in walking, not elsewhere classified 05/21/2021   Pain in left ankle and joints of left foot 05/21/2021   Myofascial pain syndrome, cervical 02/12/2021   Dizziness 04/07/2020    Cervicalgia 04/07/2020    Allergies:  Allergies  Allergen Reactions   Cephalosporins    Epinephrine     "really bad racing heart" and "everything that's not supposed to happen happens", occurs even with epinephrine in a numbing agent. Patient states years ago her dentist told her to put Epinephrine on her allergy list.   Escitalopram Oxalate Other (See Comments)    Did not like the way it made her feel.   No Healthtouch Food Allergies     Mustard, Pork, and Chocolate   Shellfish Allergy    Sudafed [Pseudoephedrine]    Medications:  Current Outpatient Medications:    celecoxib (CELEBREX) 100 MG capsule, Take 1 capsule (100 mg total) by mouth 2 (two) times daily., Disp: 30 capsule, Rfl: 0   tiZANidine (ZANAFLEX) 4 MG tablet, Take 1 tablet (4 mg total) by mouth every 8 (eight) hours as needed for muscle spasms., Disp: 30 tablet, Rfl: 0   atorvastatin (LIPITOR) 20 MG tablet, Take 1 tablet (20 mg total) by mouth daily. (Patient not taking: Reported on 02/01/2023), Disp: 90 tablet, Rfl: 3   B Complex-Folic Acid (B COMPLEX VITAMINS, W/ FA,) CAPS, Take 1 capsule by mouth daily. (Patient not taking: Reported on 02/01/2023), Disp: , Rfl:    cetirizine (ZYRTEC) 10 MG tablet, Take 10 mg by mouth as needed., Disp: , Rfl:    Cholecalciferol 50 MCG (2000 UT) TABS, Take 2,000 Units by mouth daily. (Patient not taking: Reported on 02/01/2023), Disp: , Rfl:    cyanocobalamin 100 MCG tablet, Take 100 mcg by mouth daily. (Patient not taking: Reported on 02/01/2023), Disp: , Rfl:    doxycycline (VIBRAMYCIN) 100 MG capsule, Take 100 mg by mouth 2 (two) times daily as needed. (Patient not taking: Reported on 02/01/2023), Disp: , Rfl:    EPINEPHrine 0.3 mg/0.3 mL IJ SOAJ injection, ADMINISTER 0.3 MG IN THE MUSCLE 1 TIME FOR 1 DOSE (Patient not taking: Reported on 02/01/2023), Disp: , Rfl:    estradiol (VIVELLE-DOT) 0.0375 MG/24HR, Place 1 patch onto the skin 2 (two) times a week., Disp: 8 patch, Rfl: 10    famotidine (PEPCID) 20 MG tablet, Take 20 mg by mouth daily as needed for heartburn or indigestion., Disp: , Rfl:    hydrOXYzine (ATARAX) 25 MG tablet, May take 1-2 tablets every 8 hours as needed for anxiety. (Patient not taking: Reported on 02/01/2023), Disp: , Rfl:    ibuprofen (ADVIL) 800 MG tablet, Take 800 mg by mouth 3 (three) times daily as needed., Disp: , Rfl:    MAGNESIUM PO, Take by mouth., Disp: , Rfl:    metroNIDAZOLE (METROGEL) 0.75 % gel, Apply topically., Disp: , Rfl:    pantoprazole (PROTONIX) 40 MG tablet, Take by mouth. (Patient not taking: Reported on 02/01/2023), Disp: , Rfl:    Probiotic Product (PROBIOTIC BLEND PO), Take by mouth. 1 stick pack daily, Disp: , Rfl:   Observations/Objective: Patient is well-developed, well-nourished in no acute distress.  Resting comfortably  at home.  Head is normocephalic, atraumatic.  No labored breathing.  Speech is clear and coherent with logical content.  Patient is alert and oriented at baseline.   Assessment and Plan: 1. Cervicalgia - tiZANidine (  ZANAFLEX) 4 MG tablet; Take 1 tablet (4 mg total) by mouth every 8 (eight) hours as needed for muscle spasms.  Dispense: 30 tablet; Refill: 0 - celecoxib (CELEBREX) 100 MG capsule; Take 1 capsule (100 mg total) by mouth 2 (two) times daily.  Dispense: 30 capsule; Refill: 0  2. Chronic left shoulder pain  Ongoing issue with current flare. Recent negative cardiac workup in regards to this issue. Will start trial of Celebrex and Tizanidine. She needs follow-up with her Ortho and Rheum for ongoing evaluation and further management.   Follow Up Instructions: I discussed the assessment and treatment plan with the patient. The patient was provided an opportunity to ask questions and all were answered. The patient agreed with the plan and demonstrated an understanding of the instructions.  A copy of instructions were sent to the patient via MyChart unless otherwise noted below.    The  patient was advised to call back or seek an in-person evaluation if the symptoms worsen or if the condition fails to improve as anticipated.    Veronica Climes, PA-C

## 2023-02-09 NOTE — Telephone Encounter (Signed)
Left message for pt to call back to discuss

## 2023-02-10 NOTE — Telephone Encounter (Signed)
Attempted to call back patient. LM.  Of note, she was seen by PCP yesterday for same issue  Will send MyChart message for follow up

## 2023-03-25 ENCOUNTER — Other Ambulatory Visit (HOSPITAL_COMMUNITY): Payer: Self-pay | Admitting: Family Medicine

## 2023-03-25 DIAGNOSIS — R002 Palpitations: Secondary | ICD-10-CM

## 2023-03-28 NOTE — Progress Notes (Deleted)
Cardiology Office Note:    Date:  03/28/2023   ID:  Veronica Hunter, DOB January 13, 1971, MRN [id number]  PCP:  Mattie Marlin, DO  Cardiologist:  None  Electrophysiologist:  None   Referring MD: Mattie Marlin, DO   No chief complaint on file.   History of Present Illness:    Veronica Hunter is a 53 y.o. female with a hx of fibromyalgia, GERD, reported history of MVP who presents for follow-up.  She was seen by Dr. Wyline Mood for preop evaluation 10/06/2022.  She reported chest pain and palpitations.  Had been seen in the ED on 08/06/2022, workup unremarkable, including negative troponins.  She had a calcium score of 0 in 2022.  Exercise Myoview was ordered but was denied by insurance.  Coronary CTA on 01/12/2023 showed nonobstructive CAD (mild stenosis in proximal RCA, minimal stenosis in proximal LAD, calcium score 0).  Echocardiogram 01/01/2023 showed EF 60 to 65%, grade 1 diastolic dysfunction, normal RV function, mild mitral valve prolapse with trivial mitral regurgitation.  Since last clinic visit,   She reports has been having GI issues, planning EGD.  Has been having chest pain several times per week.  Reports pain is in center of lower chest, sometimes feels sharp and sometimes feels squeezing.  Does report it happens when she exerts herself, occurs with walking up stairs.  Also feels short of breath with exertion.  Also feels chest pain when having episodes of her heart racing.  Reports she will have episodes several times a week where heart feels like racing when she is lying down at night.  Can last up to 30 minutes.  No smoking history.  Family's includes mother had PCI in 3s and died of MI in 70s.   Past Medical History:  Diagnosis Date   Anxiety    Back pain    Bilateral swelling of feet    Chest pain    Constipation    Dizziness    Fibromyalgia    Gallbladder problem    GERD (gastroesophageal reflux disease)    H/O fracture 2020   left foot, right ankle   Headache     Heartburn    High blood pressure    Indigestion    Joint pain    Multiple food allergies    Palpitations    Vitamin B12 deficiency    Vitamin D deficiency     Past Surgical History:  Procedure Laterality Date   GALLBLADDER SURGERY  1996   right ankle fracture surgery  2020   2 plates/10 screws    right ankle tendon repair  2010   TONSILLECTOMY  1996   TOTAL VAGINAL HYSTERECTOMY  2009    Current Medications: No outpatient medications have been marked as taking for the 03/31/23 encounter (Appointment) with Little Ishikawa, MD.     Allergies:   Cephalosporins, Epinephrine, Escitalopram oxalate, No healthtouch food allergies, Shellfish allergy, and Sudafed [pseudoephedrine]   Social History   Socioeconomic History   Marital status: Married    Spouse name: Not on file   Number of children: 2   Years of education: Not on file   Highest education level: Bachelor's degree (e.g., BA, AB, BS)  Occupational History   Occupation: Part time Music therapist  Tobacco Use   Smoking status: Never    Passive exposure: Never   Smokeless tobacco: Never  Vaping Use   Vaping status: Never Used  Substance and Sexual Activity   Alcohol use: Not Currently   Drug use:  Never   Sexual activity: Yes    Partners: Male    Birth control/protection: Surgical    Comment: hysterectomy, menarche 53yo, sexual debut 53yo  Other Topics Concern   Not on file  Social History Narrative   Lives at home with spouse & daughter   Right handed   Caffeine: 2 glasses of sweet tea/day   Social Drivers of Corporate investment banker Strain: Not on file  Food Insecurity: Low Risk  (03/22/2023)   Received from [facility]   Hunger Vital Sign    Worried About Running Out of Food in the Last Year: Never true    Ran Out of Food in the Last Year: Never true  Transportation Needs: No Transportation Needs (03/22/2023)   Received from [facility]    In the past 12 months, has lack  of reliable transportation kept you from medical appointments, meetings, work or from getting things needed for daily living? : No  Physical Activity: Not on file  Stress: Not on file  Social Connections: Not on file     Family History: The patient's family history includes Allergic rhinitis in her mother, paternal aunt, and son; Cancer in her mother; Diabetes in her maternal grandmother and mother; Food Allergy in her son; Heart Problems in her mother; Heart attack in her mother; Heart disease in her mother; Hyperlipidemia in her mother; Hypertension in her mother; Sinusitis in her mother, paternal aunt, and son; Sudden death in her father and mother; Urticaria in her mother. There is no history of Migraines, Colon cancer, Esophageal cancer, Stomach cancer, Asthma, Immunodeficiency, Atopy, Angioedema, or Eczema.  ROS:   Please see the history of present illness.     All other systems reviewed and are negative.  EKGs/Labs/Other Studies Reviewed:    The following studies were reviewed today:   EKG:   08/06/2022: Normal sinus rhythm, rate 76, Q waves in inferior leads and V1-4  Recent Labs: 08/31/2022: ALT 26; Hemoglobin 13.8; Platelets 194; TSH 2.400 12/11/2022: BUN 13; Creatinine, Ser 0.72; Potassium 4.4; Sodium 141  Recent Lipid Panel    Component Value Date/Time   CHOL 158 12/11/2022 1033   TRIG 90 12/11/2022 1033   HDL 41 12/11/2022 1033   CHOLHDL 3.9 12/11/2022 1033   LDLCALC 100 (H) 12/11/2022 1033    Physical Exam:    VS:  There were no vitals taken for this visit.    Wt Readings from Last 3 Encounters:  02/01/23 245 lb (111.1 kg)  12/11/22 245 lb (111.1 kg)  10/28/22 242 lb (109.8 kg)     GEN:  Well nourished, well developed in no acute distress HEENT: Normal NECK: No JVD; No carotid bruits LYMPHATICS: No lymphadenopathy CARDIAC: RRR, no murmurs, rubs, gallops RESPIRATORY:  Clear to auscultation without rales, wheezing or rhonchi  ABDOMEN: Soft, non-tender,  non-distended MUSCULOSKELETAL:  No edema; No deformity  SKIN: Warm and dry NEUROLOGIC:  Alert and oriented x 3 PSYCHIATRIC:  Normal affect   ASSESSMENT:    No diagnosis found.  PLAN:    CAD: Reported exertional chest pain.  Coronary CTA on 01/12/2023 showed nonobstructive CAD (mild stenosis in proximal RCA, minimal stenosis in proximal LAD, calcium score 0). -Started atorvastatin 20 mg daily  Palpitations: Description concerning for arrhythmia, Zio patch ordered at prior clinic visit but was not turned in***  Mitral valve prolapse: Echocardiogram 01/01/2023 showed EF 60 to 65%, grade 1 diastolic dysfunction, normal RV function, mild mitral valve prolapse with trivial mitral regurgitation.  Elevated BP without diagnosis of hypertension: She reports BP has been intermittently elevated when she checks at home.  She has been using wrist monitor.  BP okay in clinic today.  Recommend obtaining upper arm Omron monitor and checking BP twice daily for next week and letting us know results***  RTC in 3 months  Medication Adjustments/Labs and Tests Ordered: Current medicines are reviewed at length with the patient today.  Concerns regarding medicines are outlined above.  No orders of the defined types were placed in this encounter.  No orders of the defined types were placed in this encounter.   There are no Patient Instructions on file for this visit.   Signed, Little Ishikawa, MD  03/28/2023 3:07 PM     [facility]

## 2023-03-30 ENCOUNTER — Ambulatory Visit (HOSPITAL_COMMUNITY)
Admission: RE | Admit: 2023-03-30 | Discharge: 2023-03-30 | Disposition: A | Discharge status: Home / Self Care | Payer: 59 | Source: Ambulatory Visit | Attending: Family Medicine | Admitting: Family Medicine

## 2023-03-30 ENCOUNTER — Telehealth: Payer: Self-pay | Admitting: Neurology

## 2023-03-30 ENCOUNTER — Ambulatory Visit (HOSPITAL_COMMUNITY): Payer: 59

## 2023-03-30 DIAGNOSIS — R002 Palpitations: Secondary | ICD-10-CM | POA: Insufficient documentation

## 2023-03-30 NOTE — Telephone Encounter (Signed)
Request to make an Appointment

## 2023-03-30 NOTE — Progress Notes (Signed)
Temporal artery duplex completed. Please see CV Procedures for preliminary results.  Shona Simpson, RVT 03/30/23 9:32 AM

## 2023-03-31 ENCOUNTER — Ambulatory Visit: Payer: 59 | Admitting: Cardiology

## 2023-04-06 ENCOUNTER — Encounter (HOSPITAL_COMMUNITY): Payer: 59

## 2023-05-27 ENCOUNTER — Ambulatory Visit: Admitting: Cardiology

## 2023-05-27 NOTE — Progress Notes (Unsigned)
 Cardiology Office Note:    Date:  05/28/2023   ID:  Veronica Hunter, DOB 10-18-1970, MRN [id number]  PCP:  Mattie Marlin, DO  Cardiologist:  Little Ishikawa, MD  Electrophysiologist:  None   Referring MD: Mattie Marlin, DO   Chief Complaint  Patient presents with   Chest Pain    History of Present Illness:    Veronica Hunter is a 53 y.o. female with a hx of fibromyalgia, GERD, reported history of MVP who presents for follow-up.  She was seen by Dr. Wyline Mood for preop evaluation 10/06/2022.  She reported chest pain and palpitations.  Had been seen in the ED on 08/06/2022, workup unremarkable, including negative troponins.  She had a calcium score of 0 in 2022.  Exercise Myoview was ordered but was denied by insurance.  Coronary CTA on 01/12/2023 showed nonobstructive CAD (mild stenosis in proximal RCA, minimal stenosis in proximal LAD, calcium score 0).  Echocardiogram 01/01/2023 showed EF 60 to 65%, grade 1 diastolic dysfunction, normal RV function, mild mitral valve prolapse with trivial mitral regurgitation.   Since last clinic visit, she reports she has been doing okay.  She has not been taking statin.  She reports has been having hot flashes.  Continues to have intermittent chest pain.  Continues to have heart racing episodes, has not worn her Zio patch.  BP Readings from Last 3 Encounters:  05/28/23 138/80  02/01/23 122/82  01/12/23 126/63     Past Medical History:  Diagnosis Date   Anxiety    Back pain    Bilateral swelling of feet    Chest pain    Constipation    Dizziness    Fibromyalgia    Gallbladder problem    GERD (gastroesophageal reflux disease)    H/O fracture 2020   left foot, right ankle   Headache    Heartburn    High blood pressure    Indigestion    Joint pain    Multiple food allergies    Palpitations    Vitamin B12 deficiency    Vitamin D deficiency     Past Surgical History:  Procedure Laterality Date   GALLBLADDER SURGERY  1996    right ankle fracture surgery  2020   2 plates/10 screws    right ankle tendon repair  2010   TONSILLECTOMY  1996   TOTAL VAGINAL HYSTERECTOMY  2009    Current Medications: Current Meds  Medication Sig   estradiol (VIVELLE-DOT) 0.0375 MG/24HR Place 1 patch onto the skin 2 (two) times a week.   famotidine (PEPCID) 20 MG tablet Take 20 mg by mouth daily as needed for heartburn or indigestion.   ibuprofen (ADVIL) 800 MG tablet Take 800 mg by mouth 3 (three) times daily as needed.     Allergies:   Cephalosporins, Epinephrine, Escitalopram oxalate, No healthtouch food allergies, Shellfish allergy, and Sudafed [pseudoephedrine]   Social History   Socioeconomic History   Marital status: Married    Spouse name: Not on file   Number of children: 2   Years of education: Not on file   Highest education level: Bachelor's degree (e.g., BA, AB, BS)  Occupational History   Occupation: Part time Music therapist  Tobacco Use   Smoking status: Never    Passive exposure: Never   Smokeless tobacco: Never  Vaping Use   Vaping status: Never Used  Substance and Sexual Activity   Alcohol use: Never   Drug use: Never   Sexual activity: Yes  Partners: Male    Birth control/protection: Surgical    Comment: hysterectomy, menarche 53yo, sexual debut 53yo  Other Topics Concern   Not on file  Social History Narrative   Lives at home with spouse & daughter   Right handed   Caffeine: 2 glasses of sweet tea/day   Social Drivers of Corporate investment banker Strain: Not on file  Food Insecurity: Low Risk  (03/22/2023)   Received from [facility]   Hunger Vital Sign    Worried About Running Out of Food in the Last Year: Never true    Ran Out of Food in the Last Year: Never true  Transportation Needs: No Transportation Needs (03/22/2023)   Received from [facility]    In the past 12 months, has lack of reliable transportation kept you from medical appointments, meetings,  work or from getting things needed for daily living? : No  Physical Activity: Not on file  Stress: Not on file  Social Connections: Not on file     Family History: The patient's family history includes Allergic rhinitis in her mother, paternal aunt, and son; Cancer in her mother; Diabetes in her maternal grandmother and mother; Food Allergy in her son; Heart Problems in her mother; Heart attack in her mother; Heart disease in her mother; Hyperlipidemia in her mother; Hypertension in her mother; Sinusitis in her mother, paternal aunt, and son; Sudden death in her father and mother; Urticaria in her mother. There is no history of Migraines, Colon cancer, Esophageal cancer, Stomach cancer, Asthma, Immunodeficiency, Atopy, Angioedema, or Eczema.  ROS:   Please see the history of present illness.     All other systems reviewed and are negative.  EKGs/Labs/Other Studies Reviewed:    The following studies were reviewed today:   EKG:   08/06/2022: Normal sinus rhythm, rate 76, Q waves in inferior leads and V1-4  Recent Labs: 08/31/2022: ALT 26; Hemoglobin 13.8; Platelets 194; TSH 2.400 12/11/2022: BUN 13; Creatinine, Ser 0.72; Potassium 4.4; Sodium 141  Recent Lipid Panel    Component Value Date/Time   CHOL 158 12/11/2022 1033   TRIG 90 12/11/2022 1033   HDL 41 12/11/2022 1033   CHOLHDL 3.9 12/11/2022 1033   LDLCALC 100 (H) 12/11/2022 1033    Physical Exam:    VS:  BP 138/80   Pulse 78   Ht 5\' 7"  (1.702 m)   Wt 243 lb 9.6 oz (110.5 kg)   SpO2 95%   BMI 38.15 kg/m     Wt Readings from Last 3 Encounters:  05/28/23 243 lb 9.6 oz (110.5 kg)  02/01/23 245 lb (111.1 kg)  12/11/22 245 lb (111.1 kg)     GEN:  Well nourished, well developed in no acute distress HEENT: Normal NECK: No JVD; No carotid bruits LYMPHATICS: No lymphadenopathy CARDIAC: RRR, no murmurs, rubs, gallops RESPIRATORY:  Clear to auscultation without rales, wheezing or rhonchi  ABDOMEN: Soft, non-tender,  non-distended MUSCULOSKELETAL:  No edema; No deformity  SKIN: Warm and dry NEUROLOGIC:  Alert and oriented x 3 PSYCHIATRIC:  Normal affect   ASSESSMENT:    1. Coronary artery disease involving native coronary artery of native heart without angina pectoris   2. Mitral valve prolapse   3. Palpitations   4. Snoring   5. Daytime somnolence   6. Medication management     PLAN:    CAD: Reported exertional chest pain.  Coronary CTA on 01/12/2023 showed nonobstructive CAD (mild stenosis in proximal RCA, minimal stenosis  in proximal LAD, calcium score 0). -Started atorvastatin 20 mg daily but she reports he stopped taking.  Recommend restarting atorvastatin 20 mg daily and check fasting lipid panel in 2 months   Palpitations: Description concerning for arrhythmia, Zio patch ordered at prior clinic visit but was not turned in.  She reports she has not worn her ZIO yet but still has it at home.  Recommend wearing ZIO x 7 days   Mitral valve prolapse: Echocardiogram 01/01/2023 showed EF 60 to 65%, grade 1 diastolic dysfunction, normal RV function, mild mitral valve prolapse with trivial mitral regurgitation.   Elevated BP without diagnosis of hypertension: She reports BP has been intermittently elevated when she checks at home.  Recommend checking BP twice daily for next week and letting us know results  Hyperlipidemia: LDL 100 on 12/11/22.  Coronary CTA results as above.  Starting atorvastatin 20 mg daily.    Snoring/daytime somnolence: Check Itamar sleep study.  STOP-BANG 4  RTC in 6 months  Medication Adjustments/Labs and Tests Ordered: Current medicines are reviewed at length with the patient today.  Concerns regarding medicines are outlined above.  Orders Placed This Encounter  Procedures   Lipid panel   Itamar Sleep Study   Meds ordered this encounter  Medications   atorvastatin (LIPITOR) 20 MG tablet    Sig: Take 1 tablet (20 mg total) by mouth daily.    Dispense:  90 tablet     Refill:  3    Patient Instructions  Medication Instructions:  Restart: Atorvastatin 20 mg one tablet, once daily.  Lab Work: Fasting Lipid Panel in about 2 months (around 07/28/2023); go to any [facility] or our new location (level one)  Testing/Procedures: WatchPAT?  Is a FDA cleared portable home sleep study test that uses a watch and 3 points of contact to monitor 7 different channels, including your heart rate, oxygen saturations, body position, snoring, and chest motion.  The study is easy to use from the comfort of your own home and accurately detect sleep apnea.  Before bed, you attach the chest sensor, attached the sleep apnea bracelet to your nondominant hand, and attach the finger probe.  After the study, the raw data is downloaded from the watch and scored for apnea events.   For more information: [email or web address]  Patient Testing Instructions:  Do not put battery into the device until bedtime when you are ready to begin the test. Please call the support number if you need assistance after following the instructions below: 24 hour support line- [phone] or ITAMAR support at [phone] (option 2)  Download the IntelWatchPAT One" app through the google play store or App Store  Be sure to turn on or enable access to bluetooth in settlings on your smartphone/ device  Make sure no other bluetooth devices are on and within the vicinity of your smartphone/ device and WatchPAT watch during testing.  Make sure to leave your smart phone/ device plugged in and charging all night.  When ready for bed:  Follow the instructions step by step in the WatchPAT One App to activate the testing device. For additional instructions, including video instruction, visit the WatchPAT One video on Youtube. You can search for WatchPat One within Youtube (video is 4 minutes and 18 seconds) or enter: https://youtube/watch?[email or web address]_vbiwxE Please note: You will be prompted to enter a Pin  to connect via bluetooth when starting the test. The PIN will be assigned to you when you receive the test.  The device is disposable, but  it recommended that you retain the device until you receive a call letting you know the study has been received and the results have been interpreted.  We will let you know if the study did not transmit to [place] properly after the test is completed. You do not need to call us to confirm the receipt of the test.  Please complete the test within 48 hours of receiving PIN.   Frequently Asked Questions:  What is Watch Dennie Bible one?  A single use fully disposable home sleep apnea testing device and will not need to be returned after completion.  What are the requirements to use WatchPAT one?  The be able to have a successful watchpat one sleep study, you should have your Watch pat one device, your smart phone, watch pat one app, your PIN number and Internet access What type of phone do I need?  You should have a smart phone that uses Android 5.1 and above or any Iphone with IOS 10 and above How can I download the WatchPAT one app?  Based on your device type search for WatchPAT one app either in google play for android devices or APP store for Iphone's Where will I get my PIN for the study?  Your PIN will be provided by your physician's office. It is used for authentication and if you lose/forget your PIN, please reach out to your providers office.  I do not have Internet at home. Can I do WatchPAT one study?  WatchPAT One needs Internet connection throughout the night to be able to transmit the sleep data. You can use your home/local internet or your cellular's data package. However, it is always recommended to use home/local Internet. It is estimated that between 20MB-30MB will be used with each study.However, the application will be looking for space in the phone to start the study.  What happens if I lose internet or bluetooth connection?  During the internet  disconnection, your phone will not be able to transmit the sleep data. All the data, will be stored in your phone. As soon as the internet connection is back on, the phone will being sending the sleep data. During the bluetooth disconnection, WatchPAT one will not be able to to send the sleep data to your phone. Data will be kept in the [facility] one until two devices have bluetooth connection back on. As soon as the connection is back on, WatchPAT one will send the sleep data to the phone.  How long do I need to wear the WatchPAT one?  After you start the study, you should wear the device at least 6 hours.  How far should I keep my phone from the device?  During the night, your phone should be within 15 feet.  What happens if I leave the room for restroom or other reasons?  Leaving the room for any reason will not cause any problem. As soon as your get back to the room, both devices will reconnect and will continue to send the sleep data. Can I use my phone during the sleep study?  Yes, you can use your phone as usual during the study. But it is recommended to put your watchpat one on when you are ready to go to bed.  How will I get my study results?  A soon as you completed your study, your sleep data will be sent to the provider. They will then share the results with you when they are ready.      Follow-Up:  At [facility], you and your health needs are our priority.  As part of our continuing mission to provide you with exceptional heart care, we have created designated Provider Care Teams.  These Care Teams include your primary Cardiologist (physician) and Advanced Practice Providers (APPs -  Physician Assistants and Nurse Practitioners) who all work together to provide you with the care you need, when you need it.   Your next appointment:   6 month(s)  Provider:   Little Ishikawa, MD    Other Instructions Please Check your Blood Pressure, twice daily (in morning and at  bedtime), for ONE WEEK, then send [place] a MyChart Message or call [phone] with your BP log. Thank you.  Please check the Expiration on your 7 Day Zio Heart Monitor, if expired, call us and ask for a new monitor order.             Signed, Little Ishikawa, MD  05/28/2023 10:39 AM    [facility]

## 2023-05-28 ENCOUNTER — Encounter: Payer: Self-pay | Admitting: Cardiology

## 2023-05-28 ENCOUNTER — Ambulatory Visit: Attending: Cardiology | Admitting: Cardiology

## 2023-05-28 ENCOUNTER — Encounter: Payer: Self-pay | Admitting: *Deleted

## 2023-05-28 VITALS — BP 138/80 | HR 78 | Ht 67.0 in | Wt 243.6 lb

## 2023-05-28 DIAGNOSIS — R002 Palpitations: Secondary | ICD-10-CM

## 2023-05-28 DIAGNOSIS — R0683 Snoring: Secondary | ICD-10-CM | POA: Diagnosis not present

## 2023-05-28 DIAGNOSIS — I2510 Atherosclerotic heart disease of native coronary artery without angina pectoris: Secondary | ICD-10-CM | POA: Diagnosis not present

## 2023-05-28 DIAGNOSIS — I341 Nonrheumatic mitral (valve) prolapse: Secondary | ICD-10-CM

## 2023-05-28 DIAGNOSIS — R400 Somnolence: Secondary | ICD-10-CM

## 2023-05-28 DIAGNOSIS — Z79899 Other long term (current) drug therapy: Secondary | ICD-10-CM

## 2023-05-28 DIAGNOSIS — R079 Chest pain, unspecified: Secondary | ICD-10-CM

## 2023-05-28 MED ORDER — ATORVASTATIN CALCIUM 20 MG PO TABS: 20.0000 mg | ORAL_TABLET | Freq: Every day | ORAL | 3 refills | Status: DC

## 2023-05-28 NOTE — Patient Instructions (Addendum)
 Medication Instructions:  Restart: Atorvastatin 20 mg one tablet, once daily.  Lab Work: Fasting Lipid Panel in about 2 months (around 07/28/2023); go to any [facility] or our new location (level one)  Testing/Procedures: WatchPAT?  Is a FDA cleared portable home sleep study test that uses a watch and 3 points of contact to monitor 7 different channels, including your heart rate, oxygen saturations, body position, snoring, and chest motion.  The study is easy to use from the comfort of your own home and accurately detect sleep apnea.  Before bed, you attach the chest sensor, attached the sleep apnea bracelet to your nondominant hand, and attach the finger probe.  After the study, the raw data is downloaded from the watch and scored for apnea events.   For more information: [email or web address]  Patient Testing Instructions:  Do not put battery into the device until bedtime when you are ready to begin the test. Please call the support number if you need assistance after following the instructions below: 24 hour support line- [phone] or ITAMAR support at [phone] (option 2)  Download the IntelWatchPAT One" app through the google play store or App Store  Be sure to turn on or enable access to bluetooth in settlings on your smartphone/ device  Make sure no other bluetooth devices are on and within the vicinity of your smartphone/ device and WatchPAT watch during testing.  Make sure to leave your smart phone/ device plugged in and charging all night.  When ready for bed:  Follow the instructions step by step in the WatchPAT One App to activate the testing device. For additional instructions, including video instruction, visit the WatchPAT One video on Youtube. You can search for WatchPat One within Youtube (video is 4 minutes and 18 seconds) or enter: https://youtube/watch?[email or web address]_vbiwxE Please note: You will be prompted to enter a Pin to connect via bluetooth when starting  the test. The PIN will be assigned to you when you receive the test.  The device is disposable, but it recommended that you retain the device until you receive a call letting you know the study has been received and the results have been interpreted.  We will let you know if the study did not transmit to [place] properly after the test is completed. You do not need to call us to confirm the receipt of the test.  Please complete the test within 48 hours of receiving PIN.   Frequently Asked Questions:  What is Watch Dennie Bible one?  A single use fully disposable home sleep apnea testing device and will not need to be returned after completion.  What are the requirements to use WatchPAT one?  The be able to have a successful watchpat one sleep study, you should have your Watch pat one device, your smart phone, watch pat one app, your PIN number and Internet access What type of phone do I need?  You should have a smart phone that uses Android 5.1 and above or any Iphone with IOS 10 and above How can I download the WatchPAT one app?  Based on your device type search for WatchPAT one app either in google play for android devices or APP store for Iphone's Where will I get my PIN for the study?  Your PIN will be provided by your physician's office. It is used for authentication and if you lose/forget your PIN, please reach out to your providers office.  I do not have Internet at home. Can I do WatchPAT one study?  WatchPAT One  needs Internet connection throughout the night to be able to transmit the sleep data. You can use your home/local internet or your cellular's data package. However, it is always recommended to use home/local Internet. It is estimated that between 20MB-30MB will be used with each study.However, the application will be looking for space in the phone to start the study.  What happens if I lose internet or bluetooth connection?  During the internet disconnection, your phone will not be able to  transmit the sleep data. All the data, will be stored in your phone. As soon as the internet connection is back on, the phone will being sending the sleep data. During the bluetooth disconnection, WatchPAT one will not be able to to send the sleep data to your phone. Data will be kept in the [facility] one until two devices have bluetooth connection back on. As soon as the connection is back on, WatchPAT one will send the sleep data to the phone.  How long do I need to wear the WatchPAT one?  After you start the study, you should wear the device at least 6 hours.  How far should I keep my phone from the device?  During the night, your phone should be within 15 feet.  What happens if I leave the room for restroom or other reasons?  Leaving the room for any reason will not cause any problem. As soon as your get back to the room, both devices will reconnect and will continue to send the sleep data. Can I use my phone during the sleep study?  Yes, you can use your phone as usual during the study. But it is recommended to put your watchpat one on when you are ready to go to bed.  How will I get my study results?  A soon as you completed your study, your sleep data will be sent to the provider. They will then share the results with you when they are ready.      Follow-Up: At [facility], you and your health needs are our priority.  As part of our continuing mission to provide you with exceptional heart care, we have created designated Provider Care Teams.  These Care Teams include your primary Cardiologist (physician) and Advanced Practice Providers (APPs -  Physician Assistants and Nurse Practitioners) who all work together to provide you with the care you need, when you need it.   Your next appointment:   6 month(s)  Provider:   Little Ishikawa, MD    Other Instructions Please Check your Blood Pressure, twice daily (in morning and at bedtime), for ONE WEEK, then send [place] a MyChart  Message or call [phone] with your BP log. Thank you.  Please check the Expiration on your 7 Day Zio Heart Monitor, if expired, call us and ask for a new monitor order.

## 2023-06-02 ENCOUNTER — Ambulatory Visit: Attending: Cardiology

## 2023-06-02 ENCOUNTER — Other Ambulatory Visit: Payer: Self-pay | Admitting: Cardiology

## 2023-06-02 DIAGNOSIS — I341 Nonrheumatic mitral (valve) prolapse: Secondary | ICD-10-CM

## 2023-06-02 DIAGNOSIS — R002 Palpitations: Secondary | ICD-10-CM

## 2023-06-02 DIAGNOSIS — R079 Chest pain, unspecified: Secondary | ICD-10-CM

## 2023-06-02 NOTE — Telephone Encounter (Signed)
 Called pt and notified that monitor is expired and to mail back in provided box, etc. Verbalized understanding.  New Zio monitor  order entered. Informed monitors that I do not know how to cancel previous order.awaiting response

## 2023-06-02 NOTE — Progress Notes (Unsigned)
 Enrolled for [employer] to mail a ZIO XT long term holter monitor to the patients address on file.

## 2023-06-15 NOTE — Telephone Encounter (Signed)
Please schedule OV with me.

## 2023-06-16 ENCOUNTER — Ambulatory Visit: Admitting: Radiology

## 2023-06-21 ENCOUNTER — Ambulatory Visit (INDEPENDENT_AMBULATORY_CARE_PROVIDER_SITE_OTHER): Admitting: Radiology

## 2023-06-21 VITALS — BP 116/70 | HR 87 | Wt 243.0 lb

## 2023-06-21 DIAGNOSIS — E559 Vitamin D deficiency, unspecified: Secondary | ICD-10-CM | POA: Diagnosis not present

## 2023-06-21 DIAGNOSIS — N951 Menopausal and female climacteric states: Secondary | ICD-10-CM | POA: Diagnosis not present

## 2023-06-21 DIAGNOSIS — L659 Nonscarring hair loss, unspecified: Secondary | ICD-10-CM | POA: Diagnosis not present

## 2023-06-21 NOTE — Progress Notes (Signed)
   Page Pucciarelli 24-Nov-1970 [id number]   History:  53 y.o. HRT consult. She states that about 30 days ago she started flushing. Not hot flashes but flushing. She says that her hair has continued to thin as well. She is wanting blood work to check her levels. Normal TSH , CBC and CMP at PCP last week.  Gynecologic History Hysterectomy      12/10/2021    2:57 PM  Depression screen PHQ 2/9  Decreased Interest 3  Down, Depressed, Hopeless 2  PHQ - 2 Score 5  Altered sleeping 2  Tired, decreased energy 3  Change in appetite 2  Feeling bad or failure about yourself  1  Trouble concentrating 2  Moving slowly or fidgety/restless 1  Suicidal thoughts 0  PHQ-9 Score 16  Difficult doing work/chores Somewhat difficult     Past medical history, past surgical history, family history and social history were all reviewed and documented in the EPIC chart.  ROS:  A ROS was performed and pertinent positives and negatives are included.  Exam:  Vitals:   06/21/23 1503  BP: 116/70  Pulse: 87  SpO2: 100%  Weight: 243 lb (110.2 kg)   Body mass index is 38.06 kg/m.  Physical Exam Vitals and nursing note reviewed.  Constitutional:      Appearance: Normal appearance. She is obese.  Pulmonary:     Effort: Pulmonary effort is normal.  Neurological:     Mental Status: She is alert.  Psychiatric:        Mood and Affect: Mood normal.        Thought Content: Thought content normal.        Judgment: Judgment normal.      Assessment/Plan:   1. Menopausal hot flushes (Primary) - Estradiol - Testos,Total,Free and SHBG (Female)  2. Hair thinning - Iron, TIBC and Ferritin Panel - Estradiol - Testos,Total,Free and SHBG (Female)  3. Vitamin D deficiency - Vitamin D (25 hydroxy)   Will contact with results and mange accordingly  Synetta Eves B WHNP-BC 3:58 PM 06/21/2023

## 2023-06-22 ENCOUNTER — Other Ambulatory Visit: Payer: Self-pay | Admitting: Radiology

## 2023-06-22 DIAGNOSIS — N951 Menopausal and female climacteric states: Secondary | ICD-10-CM

## 2023-06-22 MED ORDER — ESTRADIOL 0.05 MG/24HR TD PTTW: 1.0000 | MEDICATED_PATCH | TRANSDERMAL | 3 refills | Status: DC

## 2023-06-25 LAB — TESTOS,TOTAL,FREE AND SHBG (FEMALE)
Free Testosterone: 2.6 pg/mL (ref 0.1–6.4)
Sex Hormone Binding: 21 nmol/L (ref 17–124)
Testosterone, Total, LC-MS-MS: 14 ng/dL (ref 2–45)

## 2023-06-25 LAB — VITAMIN D 25 HYDROXY (VIT D DEFICIENCY, FRACTURES): Vit D, 25-Hydroxy: 38 ng/mL (ref 30–100)

## 2023-06-25 LAB — IRON,TIBC AND FERRITIN PANEL
%SAT: 16 % (ref 16–45)
Ferritin: 66 ng/mL (ref 16–232)
Iron: 56 ug/dL (ref 45–160)
TIBC: 350 ug/dL (ref 250–450)

## 2023-06-25 LAB — ESTRADIOL: Estradiol: 61 pg/mL

## 2023-06-29 DIAGNOSIS — Z79890 Hormone replacement therapy (postmenopausal): Secondary | ICD-10-CM

## 2023-07-02 MED ORDER — NONFORMULARY OR COMPOUNDED ITEM: 5 refills | Status: DC

## 2023-07-02 NOTE — Telephone Encounter (Signed)
 Veronica Hunter,  Please call in testosterone 4% PLO or amhydrous versabase 30grams to apply a pea sized amount nightly to custom care.

## 2023-07-14 ENCOUNTER — Ambulatory Visit (INDEPENDENT_AMBULATORY_CARE_PROVIDER_SITE_OTHER): Admitting: Neurology

## 2023-07-14 ENCOUNTER — Encounter: Payer: Self-pay | Admitting: Neurology

## 2023-07-14 VITALS — BP 134/71 | HR 77 | Ht 67.0 in | Wt 246.0 lb

## 2023-07-14 DIAGNOSIS — E538 Deficiency of other specified B group vitamins: Secondary | ICD-10-CM | POA: Diagnosis not present

## 2023-07-14 DIAGNOSIS — E519 Thiamine deficiency, unspecified: Secondary | ICD-10-CM

## 2023-07-14 NOTE — Patient Instructions (Addendum)
 MIND diet [employer] "XX Brain" [place] Exercise, sleeping well, socialization, managing medical conditions, managing depression/anxiety, managing medications, weight loss, healthy hearing, vitamin D  levels Healthy brain study at [facility]   Recommendations to prevent or slow progression of cognitive decline:   Exercise You should increase exercise 30 to 45 minutes per day at least 3 days a week although 5 to 7 would be preferred. Any type of exercise (including walking) is acceptable although a recumbent bicycle may be best if you are unsteady. Disease related apathy can be a significant roadblock to exercise and the only way to overcome this is to make it a daily routine and perhaps have a reward at the end (something your loved one loves to eat or drink perhaps) or a personal trainer coming to the home can also be very useful. In general a structured, repetitive schedule is best.   Cardiovascular Health: You should optimize all cardiovascular risk factors (blood pressure, sugar, cholesterol) as vascular disease such as strokes and heart attacks can make memory problems much worse.   Diet: Eating a heart healthy (Mediterranean) diet is also a good idea; fish and poultry instead of red meat, nuts (mostly non-peanuts), vegetables, fruits, olive oil or canola oil (instead of butter), minimal salt (use other spices to flavor foods), whole grain rice, bread, cereal and pasta and wine in moderation.  General Health: Any diseases which effect your body will effect your brain such as a pneumonia, urinary infection, blood clot, heart attack or stroke. Keep contact with your primary care doctor for regular follow ups.  Sleep. A good nights sleep is healthy for the brain. Seven hours is recommended. If you have insomnia or poor sleep habits see the recommendations below  Tips: Structured and consistent daytime and nighttime routine, including regular wake times, bedtimes, and mealtimes
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
## 2023-07-19 LAB — METHYLMALONIC ACID, SERUM: Methylmalonic Acid: 184 nmol/L (ref 0–378)

## 2023-07-19 LAB — B12 AND FOLATE PANEL
Folate: 10.2 ng/mL (ref >3.0)
Vitamin B-12: 815 pg/mL (ref 232–1245)

## 2023-07-19 LAB — VITAMIN B1: Thiamine: 141.9 nmol/L (ref 66.5–200.0)

## 2023-07-20 ENCOUNTER — Ambulatory Visit: Payer: Self-pay | Admitting: Neurology

## 2023-07-26 ENCOUNTER — Encounter: Payer: Self-pay | Admitting: Nurse Practitioner

## 2023-07-26 ENCOUNTER — Ambulatory Visit (INDEPENDENT_AMBULATORY_CARE_PROVIDER_SITE_OTHER): Admitting: Nurse Practitioner

## 2023-07-26 VITALS — BP 128/82 | HR 80

## 2023-07-26 DIAGNOSIS — Z79890 Hormone replacement therapy (postmenopausal): Secondary | ICD-10-CM

## 2023-07-26 DIAGNOSIS — B372 Candidiasis of skin and nail: Secondary | ICD-10-CM

## 2023-07-26 MED ORDER — NYSTATIN 100000 UNIT/GM EX POWD: 1.0000 | Freq: Two times a day (BID) | CUTANEOUS | 1 refills | Status: AC

## 2023-07-26 NOTE — Progress Notes (Signed)
   Acute Office Visit  Subjective:    Patient ID: Veronica Hunter, female    DOB: September 29, 1970, 53 y.o.   MRN: [id number]   HPI 53 y.o. presents today for yeast infection of skin. H/O of yeast under abdominal fold. Has used Clotrimazole  in the past for this with good management. Now has same type of rash in left groin and under abdominal folds. Itchy, red. Has some questions about her HRT.   No LMP recorded. Patient has had a hysterectomy.    Review of Systems  Constitutional: Negative.   Skin:  Positive for rash.       Objective:     Physical Exam Constitutional:      Appearance: Normal appearance.  Skin:    Findings: Rash present.          BP 128/82   Pulse 80   SpO2 98%  Wt Readings from Last 3 Encounters:  07/14/23 246 lb (111.6 kg)  06/21/23 243 lb (110.2 kg)  05/28/23 243 lb 9.6 oz (110.5 kg)        Patient informed chaperone available to be present for breast and/or pelvic exam. Patient has requested no chaperone to be present. Patient has been advised what will be completed during breast and pelvic exam.   Assessment & Plan:   Problem List Items Addressed This Visit   None Visit Diagnoses       Skin yeast infection    -  Primary   Relevant Medications   nystatin  (MYCOSTATIN /NYSTOP ) powder     Postmenopausal hormone therapy          Plan: Rash consistent with yeast. Will try Nystatin  powder BID. Continue for 1 week after rash is gone. Keep area clean and dry to prevent recurrences. Questions answered about HRT.   Return if symptoms worsen or fail to improve.    Andee Bamberger DNP, 12:21 PM 07/26/2023

## 2023-08-12 ENCOUNTER — Other Ambulatory Visit: Payer: Self-pay

## 2023-08-12 ENCOUNTER — Encounter (HOSPITAL_COMMUNITY): Payer: Self-pay

## 2023-08-12 ENCOUNTER — Emergency Department (HOSPITAL_COMMUNITY)
Admission: EM | Admit: 2023-08-12 | Discharge: 2023-08-13 | Disposition: A | Discharge status: Home / Self Care | Attending: Emergency Medicine | Admitting: Emergency Medicine

## 2023-08-12 DIAGNOSIS — K219 Gastro-esophageal reflux disease without esophagitis: Secondary | ICD-10-CM | POA: Diagnosis not present

## 2023-08-12 DIAGNOSIS — R109 Unspecified abdominal pain: Secondary | ICD-10-CM | POA: Diagnosis present

## 2023-08-12 DIAGNOSIS — R1013 Epigastric pain: Secondary | ICD-10-CM | POA: Insufficient documentation

## 2023-08-12 DIAGNOSIS — R110 Nausea: Secondary | ICD-10-CM | POA: Insufficient documentation

## 2023-08-12 DIAGNOSIS — R1010 Upper abdominal pain, unspecified: Secondary | ICD-10-CM | POA: Diagnosis not present

## 2023-08-12 DIAGNOSIS — I10 Essential (primary) hypertension: Secondary | ICD-10-CM | POA: Diagnosis not present

## 2023-08-12 NOTE — ED Triage Notes (Addendum)
 Pt. Arrives pov. Sent from urgent care. She was seen for having gas pain. States that it has happened before usually she has to belch due to her hx of acid reflux. Pt. Had an EKG done which provider sent her for possible myocardial ischemia. Pt. Denies chest pain. States that she feels like she is having gas pain. Denies nausea and vomiting.

## 2023-08-13 ENCOUNTER — Emergency Department (HOSPITAL_COMMUNITY)

## 2023-08-13 ENCOUNTER — Encounter (HOSPITAL_COMMUNITY): Payer: Self-pay

## 2023-08-13 LAB — URINALYSIS, ROUTINE W REFLEX MICROSCOPIC
Bilirubin Urine: NEGATIVE
Glucose, UA: NEGATIVE mg/dL
Ketones, ur: NEGATIVE mg/dL
Leukocytes,Ua: NEGATIVE
Nitrite: NEGATIVE
Protein, ur: NEGATIVE mg/dL
Specific Gravity, Urine: 1.006 (ref 1.005–1.030)
pH: 6 (ref 5.0–8.0)

## 2023-08-13 LAB — COMPREHENSIVE METABOLIC PANEL WITH GFR
ALT: 22 U/L (ref 0–44)
AST: 18 U/L (ref 15–41)
Albumin: 4.1 g/dL (ref 3.5–5.0)
Alkaline Phosphatase: 63 U/L (ref 38–126)
Anion gap: 10 (ref 5–15)
BUN: 15 mg/dL (ref 6–20)
CO2: 26 mmol/L (ref 22–32)
Calcium: 8.9 mg/dL (ref 8.9–10.3)
Chloride: 101 mmol/L (ref 98–111)
Creatinine, Ser: 0.76 mg/dL (ref 0.44–1.00)
GFR, Estimated: >60 mL/min (ref >60)
Glucose, Bld: 111 mg/dL — ABNORMAL HIGH (ref 70–99)
Potassium: 3.8 mmol/L (ref 3.5–5.1)
Sodium: 137 mmol/L (ref 135–145)
Total Bilirubin: 0.5 mg/dL (ref 0.0–1.2)
Total Protein: 8 g/dL (ref 6.5–8.1)

## 2023-08-13 LAB — TROPONIN I (HIGH SENSITIVITY): Troponin I (High Sensitivity): 7 ng/L (ref <18)

## 2023-08-13 LAB — CBC
HCT: 42.3 % (ref 36.0–46.0)
Hemoglobin: 13.5 g/dL (ref 12.0–15.0)
MCH: 29.1 pg (ref 26.0–34.0)
MCHC: 31.9 g/dL (ref 30.0–36.0)
MCV: 91.2 fL (ref 80.0–100.0)
Platelets: 193 10*3/uL (ref 150–400)
RBC: 4.64 MIL/uL (ref 3.87–5.11)
RDW: 12.5 % (ref 11.5–15.5)
WBC: 9.3 10*3/uL (ref 4.0–10.5)
nRBC: 0 % (ref 0.0–0.2)

## 2023-08-13 LAB — LIPASE, BLOOD: Lipase: 36 U/L (ref 11–51)

## 2023-08-13 MED ORDER — IOHEXOL 300 MG/ML  SOLN
100.0000 mL | Freq: Once | INTRAMUSCULAR | Status: AC | PRN
  Administered 2023-08-13: 100 mL via INTRAVENOUS

## 2023-08-13 MED ORDER — OMEPRAZOLE 20 MG PO CPDR: 20.0000 mg | DELAYED_RELEASE_CAPSULE | Freq: Every day | ORAL | 1 refills | Status: DC

## 2023-08-13 MED ORDER — SUCRALFATE 1 G PO TABS: 1.0000 g | ORAL_TABLET | Freq: Four times a day (QID) | ORAL | 0 refills | Status: DC | PRN
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
## 2023-08-13 NOTE — ED Provider Notes (Signed)
 [facility] DEPT [facility] Emergency Department Provider Note MRN:  [id number]  Arrival date & time: 08/13/23     Chief Complaint   Abdominal Pain   History of Present Illness   Veronica Hunter is a 53 y.o. year-old female with a history of GERD presenting to the ED with chief complaint of abdominal pain.  Bad episode epigastric abdominal discomfort 2 weeks ago with frequent belching, mild nausea.  Lasted several hours and then resolved.  Has had issues with acid reflux for several years, a bit worse than normal over the past few weeks.  Had another episode that was very uncomfortable after lunch today.  Some radiation into the chest.  Sent here from urgent care for abnormal EKG.  Patient has concerns about aneurysm, concerns about her heart, concerns about cancer.  Review of Systems  A thorough review of systems was obtained and all systems are negative except as noted in the HPI and PMH.   Patient's Health History    Past Medical History:  Diagnosis Date   Anxiety    Back pain    Bilateral swelling of feet    Breast calcifications on mammogram    bilateral; watching, will have follow-up every 6 months for 2 yrs   Chest pain    Constipation    Dizziness    Fibromyalgia    Gallbladder problem    GERD (gastroesophageal reflux disease)    H/O fracture 2020   left foot, right ankle   Headache    Heartburn    High blood pressure    Indigestion    Joint pain    Multiple food allergies    Palpitations    Vitamin B12 deficiency    Vitamin D  deficiency     Past Surgical History:  Procedure Laterality Date   GALLBLADDER SURGERY  1996   right ankle fracture surgery  2020   2 plates/10 screws    right ankle tendon repair  2010   TONSILLECTOMY  1996   TOTAL VAGINAL HYSTERECTOMY  2009    Family History  Problem Relation Age of Onset   Sinusitis Mother    Urticaria Mother    Allergic rhinitis Mother    Hyperlipidemia Mother    Hypertension Mother    Heart  attack Mother    Cancer Mother        uterine possibly   Diabetes Mother    Heart Problems Mother    Heart disease Mother    Sudden death Mother    Sudden death Father    Allergic rhinitis Paternal Aunt    Sinusitis Paternal Aunt    Diabetes Maternal Grandmother    Sinusitis Son    Allergic rhinitis Son    Food Allergy  Son    Migraines Neg Hx    Colon cancer Neg Hx    Esophageal cancer Neg Hx    Stomach cancer Neg Hx    Asthma Neg Hx    Immunodeficiency Neg Hx    Atopy Neg Hx    Angioedema Neg Hx    Eczema Neg Hx     Social History   Socioeconomic History   Marital status: Married    Spouse name: Not on file   Number of children: 2   Years of education: Not on file   Highest education level: Bachelor's degree (e.g., BA, AB, BS)  Occupational History   Occupation: Part time Music therapist  Tobacco Use   Smoking status: Never    Passive exposure: Never  Smokeless tobacco: Never  Vaping Use   Vaping status: Never Used  Substance and Sexual Activity   Alcohol use: Never   Drug use: Never   Sexual activity: Yes    Partners: Male    Birth control/protection: Surgical    Comment: hysterectomy, menarche 53yo, sexual debut 53yo  Other Topics Concern   Not on file  Social History Narrative   Lives at home with spouse & daughter   Right handed   Caffeine: 2 glasses of sweet tea/day   Social Drivers of Corporate investment banker Strain: Not on file  Food Insecurity: Low Risk  (06/15/2023)   Received from [facility]   Hunger Vital Sign    Worried About Running Out of Food in the Last Year: Never true    Ran Out of Food in the Last Year: Never true  Transportation Needs: No Transportation Needs (06/15/2023)   Received from [employer]    In the past 12 months, has lack of reliable transportation kept you from medical appointments, meetings, work or from getting things needed for daily living? : No  Physical Activity: Not on file  Stress: Not  on file  Social Connections: Not on file  Intimate Partner Violence: Not on file     Physical Exam   Vitals:   08/12/23 2340 08/13/23 0030  BP: (!) 160/85 (!) 145/73  Pulse: 80   Resp: 19 14  Temp: 98.4 F (36.9 C)   SpO2: 98%     CONSTITUTIONAL: Well-appearing, NAD NEURO/PSYCH:  Alert and oriented x 3, no focal deficits EYES:  eyes equal and reactive ENT/NECK:  no LAD, no JVD CARDIO: Regular rate, well-perfused, normal S1 and S2 PULM:  CTAB no wheezing or rhonchi GI/GU:  non-distended, non-tender MSK/SPINE:  No gross deformities, no edema SKIN:  no rash, atraumatic   *Additional and/or pertinent findings included in MDM below  Diagnostic and Interventional Summary    EKG Interpretation Date/Time:  Thursday August 12 2023 23:31:34 EDT Ventricular Rate:  86 PR Interval:  194 QRS Duration:  89 QT Interval:  392 QTC Calculation: 469 R Axis:   -1  Text Interpretation: Sinus rhythm Inferior infarct, old Anterior infarct, old Confirmed by Gwenetta Lennert [phone]) on 08/12/2023 11:39:39 PM       Labs Reviewed  COMPREHENSIVE METABOLIC PANEL WITH GFR - Abnormal; Notable for the following components:      Result Value   Glucose, Bld 111 (*)    All other components within normal limits  URINALYSIS, ROUTINE W REFLEX MICROSCOPIC - Abnormal; Notable for the following components:   Color, Urine COLORLESS (*)    Hgb urine dipstick SMALL (*)    Bacteria, UA RARE (*)    All other components within normal limits  LIPASE, BLOOD  CBC  TROPONIN I (HIGH SENSITIVITY)    CT ABDOMEN PELVIS W CONTRAST  Final Result    DG Chest Port 1 View  Final Result      Medications  iohexol  (OMNIPAQUE ) 300 MG/ML solution 100 mL (100 mLs Intravenous Contrast Given 08/13/23 0149)     Procedures  /  Critical Care Procedures  ED Course and Medical Decision Making  Initial Impression and Ddx Favoring GERD, other considerations include choledocholithiasis, pancreatitis, neoplastic process,  atypical presentation of ACS.  Past medical/surgical history that increases complexity of ED encounter: GERD  Interpretation of Diagnostics I personally reviewed the EKG and my interpretation is as follows: Sinus rhythm, nonspecific findings, no change from prior  No significant blood count or electrolyte disturbance.  Troponin negative  Patient Reassessment and Ultimate Disposition/Management     Shared decision-making was utilized regarding advanced imaging.  Patient has some concerns about possibly something more serious going on.  CT is unremarkable, chest x-ray unremarkable, patient looks well, asymptomatic, normal vitals, appropriate for discharge.  Patient management required discussion with the following services or consulting groups:  None  Complexity of Problems Addressed Acute illness or injury that poses threat of life of bodily function  Additional Data Reviewed and Analyzed Further history obtained from: Further history from spouse/family member  Additional Factors Impacting ED Encounter Risk Prescriptions and Consideration of hospitalization  Merrick Abe. Harless Lien, MD [facility] Emergency Medicine [facility] [email or web address]  Final Clinical Impressions(s) / ED Diagnoses     ICD-10-CM   1. Pain of upper abdomen  R10.10       ED Discharge Orders          Ordered    omeprazole (PRILOSEC) 20 MG capsule  Daily        08/13/23 0249    sucralfate (CARAFATE) 1 g tablet  4 times daily PRN        08/13/23 0249             Discharge Instructions Discussed with and Provided to Patient:     Discharge Instructions      You were evaluated in the Emergency Department and after careful evaluation, we did not find any emergent condition requiring admission or further testing in the hospital.  Your exam/testing today is overall reassuring.  Symptoms likely due to acid reflux.  Recommend use of the omeprazole daily to prevent acid related pain.   Can continue the Pepcid twice daily as needed.  Can also use the Carafate as needed during the day for more immediate relief.  Follow-up with your primary care doctor.  Please return to the Emergency Department if you experience any worsening of your condition.   Thank you for allowing us  to be a part of your care.     Edson Graces, MD 08/13/23 [phone]

## 2023-08-13 NOTE — Discharge Instructions (Signed)
 You were evaluated in the Emergency Department and after careful evaluation, we did not find any emergent condition requiring admission or further testing in the hospital.  Your exam/testing today is overall reassuring.  Symptoms likely due to acid reflux.  Recommend use of the omeprazole daily to prevent acid related pain.  Can continue the Pepcid twice daily as needed.  Can also use the Carafate as needed during the day for more immediate relief.  Follow-up with your primary care doctor.  Please return to the Emergency Department if you experience any worsening of your condition.   Thank you for allowing us  to be a part of your care.

## 2023-08-17 ENCOUNTER — Ambulatory Visit: Payer: 59 | Admitting: Neurology

## 2023-09-16 ENCOUNTER — Ambulatory Visit: Admitting: Physician Assistant

## 2023-09-16 ENCOUNTER — Other Ambulatory Visit: Payer: Self-pay | Admitting: Radiology

## 2023-09-16 ENCOUNTER — Encounter: Payer: Self-pay | Admitting: Physician Assistant

## 2023-09-16 ENCOUNTER — Telehealth: Payer: Self-pay

## 2023-09-16 VITALS — BP 142/92 | HR 74 | Ht 67.0 in | Wt 243.4 lb

## 2023-09-16 DIAGNOSIS — I25119 Atherosclerotic heart disease of native coronary artery with unspecified angina pectoris: Secondary | ICD-10-CM | POA: Diagnosis not present

## 2023-09-16 DIAGNOSIS — K219 Gastro-esophageal reflux disease without esophagitis: Secondary | ICD-10-CM

## 2023-09-16 DIAGNOSIS — N951 Menopausal and female climacteric states: Secondary | ICD-10-CM

## 2023-09-16 DIAGNOSIS — Z1211 Encounter for screening for malignant neoplasm of colon: Secondary | ICD-10-CM

## 2023-09-16 DIAGNOSIS — R079 Chest pain, unspecified: Secondary | ICD-10-CM

## 2023-09-16 MED ORDER — NA SULFATE-K SULFATE-MG SULF 17.5-3.13-1.6 GM/177ML PO SOLN: 1.0000 | Freq: Once | ORAL | 0 refills | Status: AC

## 2023-09-16 NOTE — Progress Notes (Signed)
 Chief Complaint: GERD, epigastric pain  HPI:    Veronica Hunter is a 53 year old Caucasian female with a past medical history as listed below including reflux, anxiety, vitamin D  deficiency and constipation, CAD (echo 01/01/2023 with EF 60-65% grade 1 diastolic dysfunction), status postcholecystectomy and hysterectomy, known to Dr. Avram, who was referred to me by Lazoff, Shawn P, DO for a complaint of GERD and epigastric pain.      08/17/2022 patient seen by Elida Nyle Sharps, NP for reflux symptoms.  Discussed prior EGD and colonoscopy in [place] 20 years prior.  Also discussed chronic chest pain with activity level and was referred to a cardiologist.  At the time recommended an EGD and colonoscopy after cardiac clearance.  Will DUs Famotidine 20 mg daily.    05/28/2023 patient followed with cardiology for CAD and chest pain.  Discussed CTA 01/12/2023 with nonobstructive CAD (mild stenosis in the proximal RCA and minimal stenosis in the proximal LAD).  At that time still with intermittent chest pain and they recommended a Zio patch.  Also discussed possible sleep apnea testing.    08/12/2023 patient seen in the ER for upper abdominal pain.  At that time EKG was sinus rhythm and nonspecific findings, no change from prior.  Troponin negative.  CT unremarkable.  Patient started Prilosec 20 mg daily Carafate  1 g 4 times daily.  CMP with glucose of 111, normal CBC, normal lipase.    Today, the patient tells me that back in June she had terrible reflux symptoms and started on Berberine supplement recently from an integrative medicine doctor and tells me that her symptoms are 75% better and she is fairly happy with that.  She is no longer using Pepcid or Protonix but tells me that with the way her symptoms go she will likely have a flare again soon and would like to make sure she is doing the right things for her body.  Previously had used Pepcid as needed for a while, prior to that had been on what sounds like  a PPI 20 mg up to twice a day.    Patient has followed with cardiology and things seem stable.    Denies fever, chills or weight loss.   Past Medical History:  Diagnosis Date   Anxiety    Back pain    Bilateral swelling of feet    Breast calcifications on mammogram    bilateral; watching, will have follow-up every 6 months for 2 yrs   Chest pain    Constipation    Diverticulitis    Dizziness    Fibromyalgia    Gallbladder problem    GERD (gastroesophageal reflux disease)    H/O fracture 2020   left foot, right ankle   Headache    Heartburn    High blood pressure    Indigestion    Joint pain    Multiple food allergies    Palpitations    Vitamin B12 deficiency    Vitamin D  deficiency     Past Surgical History:  Procedure Laterality Date   GALLBLADDER SURGERY  1996   right ankle fracture surgery  2020   2 plates/10 screws    right ankle tendon repair  2010   TONSILLECTOMY  1996   TOTAL VAGINAL HYSTERECTOMY  2009    Current Outpatient Medications  Medication Sig Dispense Refill   BERBERINE CHLORIDE PO Take 450 mg by mouth.  Take 450 mg by mouth 2 (two) times a day.     EPINEPHrine  0.3  mg/0.3 mL IJ SOAJ injection      estradiol  (VIVELLE -DOT) 0.05 MG/24HR patch Place 1 patch (0.05 mg total) onto the skin 2 (two) times a week. 8 patch 3   ibuprofen (ADVIL) 800 MG tablet Take 800 mg by mouth 3 (three) times daily as needed.     NONFORMULARY OR COMPOUNDED ITEM Apply pea sized amount to inner thigh at bedtime daily 30 each 5   nystatin  (MYCOSTATIN /NYSTOP ) powder Apply 1 Application topically 2 (two) times daily. 30 g 1   omeprazole  (PRILOSEC) 20 MG capsule Take 1 capsule (20 mg total) by mouth daily. 30 capsule 1   sucralfate  (CARAFATE ) 1 g tablet Take 1 tablet (1 g total) by mouth 4 (four) times daily as needed. 30 tablet 0   VITAMIN D  PO Take by mouth daily.     No current facility-administered medications for this visit.    Allergies as of 09/16/2023 - Review  Complete 09/16/2023  Allergen Reaction Noted   Cephalosporins  02/12/2021   Epinephrine   04/04/2020   Escitalopram oxalate Other (See Comments) 08/19/2022   No healthtouch food allergies  12/10/2021   Shellfish allergy   11/06/2022   Sudafed [pseudoephedrine]  04/04/2020    Family History  Problem Relation Age of Onset   Sinusitis Mother    Urticaria Mother    Allergic rhinitis Mother    Hyperlipidemia Mother    Hypertension Mother    Heart attack Mother    Cancer Mother        uterine possibly   Diabetes Mother    Heart Problems Mother    Heart disease Mother    Sudden death Mother    Sudden death Father    Allergic rhinitis Paternal Aunt    Sinusitis Paternal Aunt    Diabetes Maternal Grandmother    Sinusitis Son    Allergic rhinitis Son    Food Allergy  Son    Migraines Neg Hx    Colon cancer Neg Hx    Esophageal cancer Neg Hx    Stomach cancer Neg Hx    Asthma Neg Hx    Immunodeficiency Neg Hx    Atopy Neg Hx    Angioedema Neg Hx    Eczema Neg Hx     Social History   Socioeconomic History   Marital status: Married    Spouse name: Not on file   Number of children: 2   Years of education: Not on file   Highest education level: Bachelor's degree (e.g., BA, AB, BS)  Occupational History   Occupation: Part time Music therapist  Tobacco Use   Smoking status: Never    Passive exposure: Never   Smokeless tobacco: Never  Vaping Use   Vaping status: Never Used  Substance and Sexual Activity   Alcohol use: Never   Drug use: Never   Sexual activity: Yes    Partners: Male    Birth control/protection: Surgical    Comment: hysterectomy, menarche 53yo, sexual debut 53yo  Other Topics Concern   Not on file  Social History Narrative   Lives at home with spouse & daughter   Right handed   Caffeine: 2 glasses of sweet tea/day   Social Drivers of Health   Financial Resource Strain: Not on file  Food Insecurity: Low Risk  (06/15/2023)   Received from [facility]   Hunger Vital Sign    Within the past 12 months, you worried that your food would run out before you got money to buy more: Never true  Within the past 12 months, the food you bought just didn't last and you didn't have money to get more. : Never true  Transportation Needs: No Transportation Needs (06/15/2023)   Received from [employer]    In the past 12 months, has lack of reliable transportation kept you from medical appointments, meetings, work or from getting things needed for daily living? : No  Physical Activity: Not on file  Stress: Not on file  Social Connections: Not on file  Intimate Partner Violence: Not on file    Review of Systems:    Constitutional: No weight loss, fever or chills Cardiovascular: No palpitations   Respiratory: No SOB  Gastrointestinal: See HPI and otherwise negative   Physical Exam:  Vital signs: BP (!) 142/92   Pulse 74   Ht 5' 7 (1.702 m)   Wt 243 lb 6.4 oz (110.4 kg)   BMI 38.12 kg/m    Constitutional:   Pleasant overweight Caucasian female appears to be in NAD, Well developed, Well nourished, alert and cooperative  Respiratory: Respirations even and unlabored. Lungs clear to auscultation bilaterally.   No wheezes, crackles, or rhonchi.  Cardiovascular: Normal S1, S2. No MRG. Regular rate and rhythm. No peripheral edema, cyanosis or pallor.  Gastrointestinal:  Soft, nondistended, nontender. No rebound or guarding. Normal bowel sounds. No appreciable masses or hepatomegaly. Rectal:  Not performed.  Psychiatric: Demonstrates good judgement and reason without abnormal affect or behaviors.  RELEVANT LABS AND IMAGING: CBC    Component Value Date/Time   WBC 9.3 08/12/2023 2340   RBC 4.64 08/12/2023 2340   HGB 13.5 08/12/2023 2340   HGB 13.8 08/31/2022 1550   HCT 42.3 08/12/2023 2340   HCT 42.9 08/31/2022 1550   PLT 193 08/12/2023 2340   PLT 194 08/31/2022 1550   MCV 91.2 08/12/2023 2340   MCV 91 08/31/2022 1550    MCH 29.1 08/12/2023 2340   MCHC 31.9 08/12/2023 2340   RDW 12.5 08/12/2023 2340   RDW 12.2 08/31/2022 1550   LYMPHSABS 1.6 08/31/2022 1550   EOSABS 0.1 08/31/2022 1550   BASOSABS 0.0 08/31/2022 1550    CMP     Component Value Date/Time   NA 137 08/12/2023 2340   NA 141 12/11/2022 1033   K 3.8 08/12/2023 2340   CL 101 08/12/2023 2340   CO2 26 08/12/2023 2340   GLUCOSE 111 (H) 08/12/2023 2340   BUN 15 08/12/2023 2340   BUN 13 12/11/2022 1033   CREATININE 0.76 08/12/2023 2340   CALCIUM  8.9 08/12/2023 2340   PROT 8.0 08/12/2023 2340   PROT 7.4 08/31/2022 1550   ALBUMIN 4.1 08/12/2023 2340   ALBUMIN 4.3 08/31/2022 1550   AST 18 08/12/2023 2340   ALT 22 08/12/2023 2340   ALKPHOS 63 08/12/2023 2340   BILITOT 0.5 08/12/2023 2340   BILITOT 0.3 08/31/2022 1550   GFRNONAA >60 08/12/2023 2340    Assessment: 1.  GERD/epigastric pain: Patient has a chronic history of this over the past 20+ years, recent increase in symptoms led to ER visit, EKG and troponins normal, now on berberine supplement for the past 3 weeks and feels 75% better; consider chronic gastritis +/- functional dyspepsia 2.  Screening for colorectal cancer: Last colonoscopy 20 years ago 3.  CAD with some angina: Recently seen by cardiologist and was told to wear a Zio patch for monitoring  Plan: 1.  For now continue berberine as this seems to be helping her. 2.  Patient does need a  diagnostic EGD and screening colonoscopy in the [facility].  We will schedule this with Dr. Avram.  Did provide the patient a detailed list of risks for procedures and she agrees to proceed. Patient is appropriate for endoscopic procedure(s) in the ambulatory ([facility]) setting.  3.  Did send cardiac clearance for the patient given that she just saw them in regards to a Zio patch and results from this. 4.  Patient to follow in clinic per recommendations after time of procedures.  Delon Failing, PA-C [facility] 09/16/2023, 8:44  AM  Cc: Lazoff, Shawn P, DO

## 2023-09-16 NOTE — Telephone Encounter (Signed)
 Duplicate RF request received from [facility] pharmacy.  Pharmacy is requesting a 90 day supply of estradiol  0.05 mg patch.  #24 sent today (90 day supply).

## 2023-09-16 NOTE — Telephone Encounter (Signed)
 [facility] Pre-operative Risk Assessment     Request for surgical clearance:     Endoscopy Procedure  What type of surgery is being performed?     Colonoscopy/egd  When is this surgery scheduled?     11/25/23  What type of clearance is required ?   cardiac  Are there any medications that need to be held prior to surgery and how long? No medications. Pt has CAD and palpatations  Practice name and name of physician performing surgery?       Gastroenterology  What is your office phone and fax number?      Phone- [phone]  Fax- [phone]  Anesthesia type (None, local, MAC, general) ?       MAC   Please route your response to Corean [place], [facility]

## 2023-09-16 NOTE — Telephone Encounter (Signed)
 Med refill request: estradiol  0.05 mg patch Last AEX: 10/28/22 Next AEX: none scheduled Last MMG (if hormonal med) 07/07/23 BI-RADS 3 probably benign Refill authorized: Needs appointment. Please Advise?

## 2023-09-16 NOTE — Patient Instructions (Signed)
 You have been scheduled for an endoscopy and colonoscopy. Please follow the written instructions given to you at your visit today.  If you use inhalers (even only as needed), please bring them with you on the day of your procedure.  DO NOT TAKE 7 DAYS PRIOR TO TEST- Trulicity (dulaglutide) Ozempic, Wegovy (semaglutide) Mounjaro (tirzepatide) Bydureon Bcise (exanatide extended release)  DO NOT TAKE 1 DAY PRIOR TO YOUR TEST Rybelsus (semaglutide) Adlyxin (lixisenatide) Victoza (liraglutide) Byetta (exanatide) ___________________________________________________________________________  We have sent the following medications to your pharmacy for you to pick up at your convenience: suprep  _______________________________________________________  If your blood pressure at your visit was 140/90 or greater, please contact your primary care physician to follow up on this.  _______________________________________________________  If you are age 34 or older, your body mass index should be between 23-30. Your Body mass index is 38.12 kg/m. If this is out of the aforementioned range listed, please consider follow up with your Primary Care Provider.  If you are age 71 or younger, your body mass index should be between 19-25. Your Body mass index is 38.12 kg/m. If this is out of the aformentioned range listed, please consider follow up with your Primary Care Provider.   ________________________________________________________  The [facility] providers would like to encourage you to use MYCHART to communicate with providers for non-urgent requests or questions.  Due to long hold times on the telephone, sending your provider a message by [facility] may be a faster and more efficient way to get a response.  Please allow 48 business hours for a response.  Please remember that this is for non-urgent requests.  _______________________________________________________

## 2023-09-17 NOTE — Telephone Encounter (Signed)
   Patient Name: Veronica Hunter  DOB: Sep 08, 1970 MRN: [id number]  Primary Cardiologist: Lonni LITTIE Nanas, MD  Chart reviewed as part of pre-operative protocol coverage. Given past medical history and time since last visit, based on ACC/AHA guidelines, Veronica Hunter is at acceptable risk for the planned procedure without further cardiovascular testing.   The patient was advised that if she develops new symptoms prior to surgery to contact our office to arrange for a follow-up visit, and she verbalized understanding.  I will route this recommendation to the requesting party via Epic fax function and remove from pre-op pool.  Please call with questions.  Charnae Lill, [place] 09/17/2023, 1:35 PM

## 2023-09-17 NOTE — Telephone Encounter (Signed)
 Pt has been cleared from a cardiac stand point.

## 2023-09-30 ENCOUNTER — Encounter: Payer: Self-pay | Admitting: Cardiology

## 2023-09-30 ENCOUNTER — Encounter: Payer: Self-pay | Admitting: Internal Medicine

## 2023-09-30 NOTE — Telephone Encounter (Signed)
 Called pt- she reports that she has been taking supplement called Berberine and while she was on it for 3 weeks- her gi symptoms mostly went away.  Went off of it 2 days ago bc her pcp told her to go off it and start probiotics.   Today is 3 rd day w/o berberine- just started probiotics today. Within the first 24 hrs after stopping berberine her gi symptoms started coming back like she had before and they have been pretty intense. She reports that she felt a lot of pressure in abdomen with constipation- and some missed or extra beats and felt a few flutters afterward when she was lying down.  She just wanted to report these findings. That these heart rhythm changes seem to occur more when she is having GI problems and also when she is lying down.   That's why she is wondering about the results of her monitor. Informed her that I would send this information to her provider and we will be in touch with results.  Given ER precautions. She verbalized understanding.

## 2023-09-30 NOTE — Telephone Encounter (Signed)
 Left a message to call back.

## 2023-09-30 NOTE — Telephone Encounter (Signed)
 Pt returning call

## 2023-10-01 ENCOUNTER — Ambulatory Visit: Payer: Self-pay | Admitting: Cardiology

## 2023-10-01 DIAGNOSIS — R079 Chest pain, unspecified: Secondary | ICD-10-CM | POA: Diagnosis not present

## 2023-10-01 DIAGNOSIS — R002 Palpitations: Secondary | ICD-10-CM

## 2023-10-01 DIAGNOSIS — I341 Nonrheumatic mitral (valve) prolapse: Secondary | ICD-10-CM

## 2023-10-01 NOTE — Telephone Encounter (Signed)
 See result note.

## 2023-10-25 ENCOUNTER — Telehealth: Payer: Self-pay | Admitting: Physician Assistant

## 2023-10-25 MED ORDER — HYOSCYAMINE SULFATE 0.125 MG SL SUBL: 0.1250 mg | SUBLINGUAL_TABLET | Freq: Four times a day (QID) | SUBLINGUAL | 1 refills | Status: DC | PRN

## 2023-10-25 NOTE — Telephone Encounter (Signed)
 Called pt. Discussed adding Hyoscyamine  for esophageal spasms. Pt in agreement to try medication. Discussed dosage, how and when to take. Pharmacy verified. Medication sent.

## 2023-10-25 NOTE — Telephone Encounter (Signed)
 Inbound call from patient stating her heart palpitations due to her esophagus are becoming more intense. States previously Dr. Avram recommended to change 9/18 endo and colon to just a endo to be seen sooner. Patient is requesting a call to discuss further. Please advise, thank you

## 2023-10-25 NOTE — Telephone Encounter (Signed)
 Called and spoke with patient. She reports that the spasms in her esophagus are getting worse. She states that she has resorted to a more liquid diet to try and decrease the intensity. She reports that the spasms are worse at hs. She currently sleeps with hob elevated.  Patient reports that she was taking Protonix, however this seemed to make the spasms worse. She is also taking Pepcid, but says this doesn't seem to be helping anymore either. Per Dr. Darilyn message to patient on 09/30/2023 rescheduled patient's upper endoscopy to 11/10/2023 and canceled the screening colonoscopy for now. Patient will reschedule at a later date.Patient has already been cleared from a cardiology standpoint. Will resend instructions via my chart per patient request. Will route to provider for any further recommendations.

## 2023-10-26 ENCOUNTER — Other Ambulatory Visit: Payer: Self-pay

## 2023-10-26 ENCOUNTER — Encounter: Payer: Self-pay | Admitting: Cardiology

## 2023-10-26 ENCOUNTER — Emergency Department (HOSPITAL_COMMUNITY)
Admission: EM | Admit: 2023-10-26 | Discharge: 2023-10-27 | Disposition: A | Discharge status: Home / Self Care | Attending: Emergency Medicine | Admitting: Emergency Medicine

## 2023-10-26 DIAGNOSIS — Z79899 Other long term (current) drug therapy: Secondary | ICD-10-CM | POA: Insufficient documentation

## 2023-10-26 DIAGNOSIS — I10 Essential (primary) hypertension: Secondary | ICD-10-CM | POA: Insufficient documentation

## 2023-10-26 DIAGNOSIS — R002 Palpitations: Secondary | ICD-10-CM | POA: Insufficient documentation

## 2023-10-26 NOTE — Telephone Encounter (Signed)
 Can we get the EKG and notes from her PCP?

## 2023-10-26 NOTE — ED Triage Notes (Signed)
 Patient c/o palpitations x 3 days. Patient report difficulty sleeping with her palpitations. Patient report PCP recommeded to be seen in ED for further work up. Patient denies Chest pain and SOB. Patient denies N/V.

## 2023-10-27 ENCOUNTER — Telehealth: Payer: Self-pay | Admitting: Cardiology

## 2023-10-27 ENCOUNTER — Emergency Department (HOSPITAL_COMMUNITY)

## 2023-10-27 LAB — CBC WITH DIFFERENTIAL/PLATELET
Abs Immature Granulocytes: 0.02 K/uL (ref 0.00–0.07)
Basophils Absolute: 0.1 K/uL (ref 0.0–0.1)
Basophils Relative: 1 %
Eosinophils Absolute: 0.1 K/uL (ref 0.0–0.5)
Eosinophils Relative: 1 %
HCT: 40.5 % (ref 36.0–46.0)
Hemoglobin: 12.9 g/dL (ref 12.0–15.0)
Immature Granulocytes: 0 %
Lymphocytes Relative: 25 %
Lymphs Abs: 2.1 K/uL (ref 0.7–4.0)
MCH: 28.7 pg (ref 26.0–34.0)
MCHC: 31.9 g/dL (ref 30.0–36.0)
MCV: 90.2 fL (ref 80.0–100.0)
Monocytes Absolute: 0.7 K/uL (ref 0.1–1.0)
Monocytes Relative: 8 %
Neutro Abs: 5.6 K/uL (ref 1.7–7.7)
Neutrophils Relative %: 65 %
Platelets: 175 K/uL (ref 150–400)
RBC: 4.49 MIL/uL (ref 3.87–5.11)
RDW: 12.3 % (ref 11.5–15.5)
WBC: 8.7 K/uL (ref 4.0–10.5)
nRBC: 0 % (ref 0.0–0.2)

## 2023-10-27 LAB — COMPREHENSIVE METABOLIC PANEL WITH GFR
ALT: 24 U/L (ref 0–44)
AST: 22 U/L (ref 15–41)
Albumin: 3.8 g/dL (ref 3.5–5.0)
Alkaline Phosphatase: 56 U/L (ref 38–126)
Anion gap: 8 (ref 5–15)
BUN: 16 mg/dL (ref 6–20)
CO2: 27 mmol/L (ref 22–32)
Calcium: 8.8 mg/dL — ABNORMAL LOW (ref 8.9–10.3)
Chloride: 102 mmol/L (ref 98–111)
Creatinine, Ser: 0.78 mg/dL (ref 0.44–1.00)
GFR, Estimated: >60 mL/min (ref >60)
Glucose, Bld: 99 mg/dL (ref 70–99)
Potassium: 4 mmol/L (ref 3.5–5.1)
Sodium: 137 mmol/L (ref 135–145)
Total Bilirubin: 0.4 mg/dL (ref 0.0–1.2)
Total Protein: 7.4 g/dL (ref 6.5–8.1)

## 2023-10-27 LAB — URINALYSIS, ROUTINE W REFLEX MICROSCOPIC
Bilirubin Urine: NEGATIVE
Glucose, UA: NEGATIVE mg/dL
Ketones, ur: NEGATIVE mg/dL
Leukocytes,Ua: NEGATIVE
Nitrite: NEGATIVE
Protein, ur: NEGATIVE mg/dL
Specific Gravity, Urine: 1.008 (ref 1.005–1.030)
pH: 7 (ref 5.0–8.0)

## 2023-10-27 LAB — MAGNESIUM: Magnesium: 2.3 mg/dL (ref 1.7–2.4)

## 2023-10-27 LAB — TROPONIN I (HIGH SENSITIVITY): Troponin I (High Sensitivity): 5 ng/L (ref <18)

## 2023-10-27 MED ORDER — METOPROLOL TARTRATE 25 MG PO TABS
12.5000 mg | ORAL_TABLET | Freq: Two times a day (BID) | ORAL | 0 refills | Status: DC
Start: 2023-10-27 — End: 2023-11-02

## 2023-10-27 MED ORDER — METOPROLOL TARTRATE 5 MG/5ML IV SOLN
5.0000 mg | Freq: Once | INTRAVENOUS | Status: AC
  Administered 2023-10-27: 5 mg via INTRAVENOUS
  Filled 2023-10-27: qty 5

## 2023-10-27 NOTE — Telephone Encounter (Signed)
 Pt would like a c/b in regards to her MyChart message.

## 2023-10-27 NOTE — Telephone Encounter (Signed)
 Spoke with pt for 40 minutes. Pt is very anxious. Pt states she was discharged last night with lopressor  12.5mg  twice daily. Stated she does feel better and having less palpations currently. Pt thought her palpations were coming from a GI concern until last night. Spoke with pt about ED/911 precautions, how metoprolol  works and stressors currently happening in her life. Asked about interaction between pepcid and lopressor . Advised I was unaware of any but would send to pharmD for review in case. Pt stated feeling better after our conversion. Pt has appointment next Friday with Dr Kate, advised I would forward to him and his nurse in case of recommendations before appointment.

## 2023-10-27 NOTE — Telephone Encounter (Signed)
 Agree with plan to start the lopressor  and see in clinic next week

## 2023-10-27 NOTE — Telephone Encounter (Signed)
 Addressed on Phone note 10/27/23

## 2023-10-27 NOTE — ED Provider Notes (Signed)
 [place] EMERGENCY DEPARTMENT AT [facility] Provider Note   CSN: [id number] Arrival date & time: 10/26/23  2333     Patient presents with: Palpitations   Veronica Hunter is a 53 y.o. female past medical history significant for GERD, hypertension, palpitations, and diverticulitis presents today for 3 days of palpitations.  Patient reports difficulty sleeping due to these palpitations.  Patient's PCP recommended she be seen in the ED for evaluation.  Patient denies chest pain, shortness of breath, nausea, vomiting, fever, chills, diarrhea, or any other complaints at this time.    Palpitations      Prior to Admission medications   Medication Sig Start Date End Date Taking? Authorizing Provider  metoprolol  tartrate (LOPRESSOR ) 25 MG tablet Take 0.5 tablets (12.5 mg total) by mouth 2 (two) times daily. 10/27/23  Yes Francis Ileana SAILOR, PA-C  BERBERINE CHLORIDE PO Take 450 mg by mouth.  Take 450 mg by mouth 2 (two) times a day.    [provider]  EPINEPHrine  0.3 mg/0.3 mL IJ SOAJ injection  10/21/22   [provider]  estradiol  (VIVELLE -DOT) 0.05 MG/24HR patch APPLY 1 PATCH(0.05 MG) TOPICALLY TO THE SKIN 2 TIMES A WEEK 09/16/23   Chrzanowski, Jami B, NP  hyoscyamine  (LEVSIN  SL) 0.125 MG SL tablet Place 1 tablet (0.125 mg total) under the tongue every 6 (six) hours as needed. 10/25/23   Zehr, Jessica D, PA-C  ibuprofen (ADVIL) 800 MG tablet Take 800 mg by mouth 3 (three) times daily as needed.    [provider]  NONFORMULARY OR COMPOUNDED ITEM Apply pea sized amount to inner thigh at bedtime daily 07/02/23   Chrzanowski, Jami B, NP  nystatin  (MYCOSTATIN /NYSTOP ) powder Apply 1 Application topically 2 (two) times daily. 07/26/23   Prentiss Annabella LABOR, NP  omeprazole  (PRILOSEC) 20 MG capsule Take 1 capsule (20 mg total) by mouth daily. 08/13/23   Theadore Ozell HERO, MD  sucralfate  (CARAFATE ) 1 g tablet Take 1 tablet (1 g total) by mouth 4 (four) times daily as needed.  08/13/23   Theadore Ozell HERO, MD  VITAMIN D  PO Take by mouth daily.    [provider]    Allergies: Cephalosporins, Epinephrine , Escitalopram oxalate, No healthtouch food allergies, Shellfish allergy , and Sudafed [pseudoephedrine]    Review of Systems  Cardiovascular:  Positive for palpitations.    Updated Vital Signs BP (!) 144/79 (BP Location: Left Arm)   Pulse 74   Temp 98.8 F (37.1 C) (Oral)   Resp 18   SpO2 95%   Physical Exam Vitals and nursing note reviewed.  Constitutional:      General: She is not in acute distress.    Appearance: She is well-developed.  HENT:     Head: Normocephalic and atraumatic.  Eyes:     Conjunctiva/sclera: Conjunctivae normal.  Cardiovascular:     Rate and Rhythm: Normal rate and regular rhythm.     Heart sounds: No murmur heard. Pulmonary:     Effort: Pulmonary effort is normal. No respiratory distress.     Breath sounds: Normal breath sounds.  Abdominal:     Palpations: Abdomen is soft.     Tenderness: There is no abdominal tenderness.  Musculoskeletal:        General: No swelling.     Cervical back: Neck supple.  Skin:    General: Skin is warm and dry.     Capillary Refill: Capillary refill takes less than 2 seconds.  Neurological:     Mental Status: She is  alert.  Psychiatric:        Mood and Affect: Mood normal.     (all labs ordered are listed, but only abnormal results are displayed) Labs Reviewed  COMPREHENSIVE METABOLIC PANEL WITH GFR - Abnormal; Notable for the following components:      Result Value   Calcium  8.8 (*)    All other components within normal limits  URINALYSIS, ROUTINE W REFLEX MICROSCOPIC - Abnormal; Notable for the following components:   Color, Urine COLORLESS (*)    Hgb urine dipstick SMALL (*)    Bacteria, UA RARE (*)    All other components within normal limits  CBC WITH DIFFERENTIAL/PLATELET  MAGNESIUM  TROPONIN I (HIGH SENSITIVITY)    EKG: EKG Interpretation Date/Time:  Tuesday  October 26 2023 23:44:08 EDT Ventricular Rate:  92 PR Interval:  188 QRS Duration:  89 QT Interval:  381 QTC Calculation: 472 R Axis:   -10  Text Interpretation: Sinus tachycardia Ventricular trigeminy Inferior infarct, old Anterior infarct, old Confirmed by Theadore Sharper [id number]) on 10/27/2023 1:58:30 AM  Radiology: DG Chest 2 View Result Date: 10/27/2023 CLINICAL DATA:  Palpitations EXAM: CHEST - 2 VIEW COMPARISON:  08/13/2023 FINDINGS: Stable cardiomediastinal silhouette. No focal consolidation, pleural effusion, or pneumothorax. No displaced rib fractures. IMPRESSION: No acute cardiopulmonary disease. Electronically Signed   By: Norman Gatlin M.D.   On: 10/27/2023 02:21     Procedures   Medications Ordered in the ED  metoprolol  tartrate (LOPRESSOR ) injection 5 mg (5 mg Intravenous Given 10/27/23 0213)                                    Medical Decision Making Amount and/or Complexity of Data Reviewed Labs: ordered. Radiology: ordered.  Risk Prescription drug management.   This patient presents to the ED for concern of palpitations, this involves an extensive number of treatment options, and is a complaint that carries with it a high risk of complications and morbidity.  The differential diagnosis includes STEMI, NSTEMI, electrolyte abnormality, arrhythmia, anemia   Additional history obtained:  Additional history obtained from EMR External records from outside source obtained and reviewed including cardiology notes   Lab Tests:  I Ordered, and personally interpreted labs.  The pertinent results include: Mild hypocalcemia at 8.8, CBC WNL, UA with small hemoglobin and rare bacteria, magnesium 2.3   Imaging Studies ordered:  I ordered imaging studies including chest x-ray I independently visualized and interpreted imaging which showed no acute cardiopulmonary disease I agree with the radiologist interpretation   Cardiac Monitoring: / EKG:  The patient was  maintained on a cardiac monitor.  I personally viewed and interpreted the cardiac monitored which showed an underlying rhythm of: Sinus tachycardia, ventricular trigeminy   Problem List / ED Course / Critical interventions / Medication management I ordered medication including metoprolol  Reevaluation of the patient after these medicines showed that the patient improvement in the intensity of symptoms I have reviewed the patients home medicines and have made adjustments as needed  Test / Admission - Considered:  Considered for admission or further workup however patient's vital signs, physical exam, labs, and imaging are reassuring.  Patient symptoms likely due to frequent PVCs.  Patient given short course of metoprolol  outpatient and advised to follow-up with cardiology as soon as possible for further evaluation workup.  Patient given return precautions.  I feel patient safe for discharge at this time.  Final diagnoses:  Palpitations    ED Discharge Orders          Ordered    metoprolol  tartrate (LOPRESSOR ) 25 MG tablet  2 times daily        10/27/23 0321               Francis Ileana SAILOR, PA-C 10/27/23 0322    Theadore Ozell HERO, MD 10/27/23 [phone]

## 2023-10-27 NOTE — Discharge Instructions (Signed)
 Today you were seen for palpitations.  Please pick up your medication and take as prescribed.  Please follow-up with cardiology soon as possible for further evaluation workup.  Thank you for letting us  treat you today. After reviewing your labs and imaging, I feel you are safe to go home. Please follow up with your PCP in the next several days and provide them with your records from this visit. Return to the Emergency Room if pain becomes severe or symptoms worsen.

## 2023-10-28 ENCOUNTER — Telehealth: Payer: Self-pay | Admitting: *Deleted

## 2023-10-28 NOTE — Telephone Encounter (Signed)
 Called and spoke to patient. Patient reports symptoms are better since starting Lopressor  but continues to have thumping sensation in center of chest after eating and at night when lying down. Patient request sooner appt to see provider. Appt made for 8/26. Ed precautions reviewed with patient . Made patient aware that this will be forward to provider for advice or sooner appt.

## 2023-10-28 NOTE — Telephone Encounter (Signed)
 Called and made patient aware per Dr. Kate agreed with plan to start Lopressor . Patient verbalized an understanding

## 2023-10-28 NOTE — Telephone Encounter (Signed)
 8/26 is soonest opening, would keep appointment then

## 2023-10-29 ENCOUNTER — Telehealth: Payer: Self-pay | Admitting: Cardiology

## 2023-10-29 NOTE — Telephone Encounter (Signed)
 Called patient and medication listed reviewed with patient. Made patient aware to make sure she taking Prilosec and sucralfate  as ordered per her provider for indigestion and reflux as patient explained. Patient verbalized she has not started taking medication yet but will start today.  Advised patient to keep appt with Dr. Kate 8/26 @9 :20am. Patient verbalized an understanding.

## 2023-10-29 NOTE — Telephone Encounter (Signed)
 Pt would like to know what type of medication she can use when she's having reflux at night

## 2023-10-31 NOTE — Progress Notes (Unsigned)
 Cardiology Office Note:    Date:  11/02/2023   ID:  Veronica Hunter, DOB [birth year], MRN [id number]  PCP:  Macarthur Elouise SQUIBB, DO  Cardiologist:  Lonni LITTIE Nanas, MD  Electrophysiologist:  None   Referring MD: Lazoff, Shawn P, DO   Chief Complaint  Patient presents with   Palpitations    History of Present Illness:    Veronica Hunter is a 53 y.o. female with a hx of fibromyalgia, GERD, reported history of MVP who presents for follow-up.  She was seen by Dr. Alvan for preop evaluation 10/06/2022.  She reported chest pain and palpitations.  Had been seen in the ED on 08/06/2022, workup unremarkable, including negative troponins.  She had a calcium  score of 0 in 2022.  Exercise Myoview was ordered but was denied by insurance.  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  Echocardiogram 01/01/2023 showed EF 60 to 65%, grade 1 diastolic dysfunction, normal RV function, mild mitral valve prolapse with trivial mitral regurgitation.  Zio patch x 7 days 08/2023 showed 3 episodes of SVT with longest lasting 15 seconds, symptoms corresponded to PVCs.   Since last clinic visit, she reports has been having palpitations where feels like heart is thumping.  Feels lightheaded during episodes.  Seems related to eating and drinking.  She denies any caffeine use.  BP Readings from Last 3 Encounters:  11/02/23 130/78  10/27/23 (!) 157/89  09/16/23 (!) 142/92     Past Medical History:  Diagnosis Date   Anxiety    Back pain    Bilateral swelling of feet    Breast calcifications on mammogram    bilateral; watching, will have follow-up every 6 months for 2 yrs   Chest pain    Constipation    Diverticulitis    Dizziness    Fibromyalgia    Gallbladder problem    GERD (gastroesophageal reflux disease)    H/O fracture 2020   left foot, right ankle   Headache    Heartburn    High blood pressure     Indigestion    Joint pain    Multiple food allergies    Palpitations    Vitamin B12 deficiency    Vitamin D  deficiency     Past Surgical History:  Procedure Laterality Date   GALLBLADDER SURGERY  1996   right ankle fracture surgery  2020   2 plates/10 screws    right ankle tendon repair  2010   TONSILLECTOMY  1996   TOTAL VAGINAL HYSTERECTOMY  2009    Current Medications: Current Meds  Medication Sig   atorvastatin  (LIPITOR) 20 MG tablet Take 1 tablet (20 mg total) by mouth daily.   EPINEPHrine  0.3 mg/0.3 mL IJ SOAJ injection    estradiol  (VIVELLE -DOT) 0.05 MG/24HR patch APPLY 1 PATCH(0.05 MG) TOPICALLY TO THE SKIN 2 TIMES A WEEK   ibuprofen (ADVIL) 800 MG tablet Take 800 mg by mouth 3 (three) times daily as needed.   metoprolol  succinate (TOPROL  XL) 25 MG 24 hr tablet Take 1 tablet (25 mg total) by mouth daily.   sucralfate  (CARAFATE ) 1 g tablet Take 1 tablet (1 g total) by mouth 4 (four) times daily as needed.   [DISCONTINUED] metoprolol  tartrate (LOPRESSOR ) 25 MG tablet Take 0.5 tablets (12.5 mg total) by mouth 2 (two) times daily.     Allergies:   Cephalosporins, Epinephrine , Escitalopram oxalate, No healthtouch food allergies, Shellfish allergy , and Sudafed [pseudoephedrine]  Social History   Socioeconomic History   Marital status: Married    Spouse name: Not on file   Number of children: 2   Years of education: Not on file   Highest education level: Bachelor's degree (e.g., BA, AB, BS)  Occupational History   Occupation: Part time Music therapist  Tobacco Use   Smoking status: Never    Passive exposure: Never   Smokeless tobacco: Never  Vaping Use   Vaping status: Never Used  Substance and Sexual Activity   Alcohol use: Never   Drug use: Never   Sexual activity: Yes    Partners: Male    Birth control/protection: Surgical    Comment: hysterectomy, menarche 53yo, sexual debut 53yo  Other Topics Concern   Not on file  Social History Narrative   Lives at  home with spouse & daughter   Right handed   Caffeine: 2 glasses of sweet tea/day   Social Drivers of Corporate investment banker Strain: Not on file  Food Insecurity: Low Risk  (10/26/2023)   Received from [facility]   Hunger Vital Sign    Within the past 12 months, you worried that your food would run out before you got money to buy more: Never true    Within the past 12 months, the food you bought just didn't last and you didn't have money to get more. : Never true  Transportation Needs: No Transportation Needs (10/26/2023)   Received from [facility]    In the past 12 months, has lack of reliable transportation kept you from medical appointments, meetings, work or from getting things needed for daily living? : No  Physical Activity: Not on file  Stress: Not on file  Social Connections: Not on file     Family History: The patient's family history includes Allergic rhinitis in her mother, paternal aunt, and son; Cancer in her mother; Diabetes in her maternal grandmother and mother; Food Allergy  in her son; Heart Problems in her mother; Heart attack in her mother; Heart disease in her mother; Hyperlipidemia in her mother; Hypertension in her mother; Sinusitis in her mother, paternal aunt, and son; Sudden death in her father and mother; Urticaria in her mother. There is no history of Migraines, Colon cancer, Esophageal cancer, Stomach cancer, Asthma, Immunodeficiency, Atopy, Angioedema, or Eczema.  ROS:   Please see the history of present illness.     All other systems reviewed and are negative.  EKGs/Labs/Other Studies Reviewed:    The following studies were reviewed today:   EKG:   08/06/2022: Normal sinus rhythm, rate 76, Q waves in inferior leads and V1-4  Recent Labs: 10/27/2023: ALT 24; BUN 16; Creatinine, Ser 0.78; Hemoglobin 12.9; Magnesium 2.3; Platelets 175; Potassium 4.0; Sodium 137  Recent Lipid Panel    Component Value Date/Time   CHOL 158  12/11/2022 1033   TRIG 90 12/11/2022 1033   HDL 41 12/11/2022 1033   CHOLHDL 3.9 12/11/2022 1033   LDLCALC 100 (H) 12/11/2022 1033    Physical Exam:    VS:  BP 130/78   Pulse 74   Ht 5' 7 (1.702 m)   Wt 243 lb 3.2 oz (110.3 kg)   SpO2 98%   BMI 38.09 kg/m     Wt Readings from Last 3 Encounters:  11/02/23 243 lb 3.2 oz (110.3 kg)  09/16/23 243 lb 6.4 oz (110.4 kg)  07/14/23 246 lb (111.6 kg)     GEN:  Well nourished, well developed in  no acute distress HEENT: Normal NECK: No JVD; No carotid bruits LYMPHATICS: No lymphadenopathy CARDIAC: RRR, no murmurs, rubs, gallops RESPIRATORY:  Clear to auscultation without rales, wheezing or rhonchi  ABDOMEN: Soft, non-tender, non-distended MUSCULOSKELETAL:  No edema; No deformity  SKIN: Warm and dry NEUROLOGIC:  Alert and oriented x 3 PSYCHIATRIC:  Normal affect   ASSESSMENT:    1. Palpitations   2. PVC's (premature ventricular contractions)   3. Coronary artery disease involving native coronary artery of native heart without angina pectoris   4. Hyperlipidemia, unspecified hyperlipidemia type   5. Essential hypertension   6. Snoring      PLAN:    Palpitations/PVCs: Zio patch x 7 days 08/2023 showed 3 episodes of SVT with longest lasting 15 seconds.  Symptoms corresponded to PVCs.  ED visit 10/2023 with palpitations, appeared to correspond to PVCs - Started on metoprolol  12.5 mg twice daily with improvement in palpitations, will consolidate to Toprol -XL 25 mg daily  CAD: Reported exertional chest pain.  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 - Had recommended starting atorvastatin  but she did not start.  Recommend starting atorvastatin  20 mg daily   Mitral valve prolapse: Echocardiogram 01/01/2023 showed EF 60 to 65%, grade 1 diastolic dysfunction, normal RV function, mild mitral valve prolapse with trivial mitral regurgitation.    Hypertension: Continue metoprolol , will consolidate to Toprol -XL as above  Hyperlipidemia: LDL 100 on 12/11/22.  Coronary CTA results as above.  Start atorvastatin  20 mg daily and check fasting lipid panel in 3 months  Snoring/daytime somnolence: Check Itamar sleep study.  STOP-BANG 4  RTC in 3 months  Medication Adjustments/Labs and Tests Ordered: Current medicines are reviewed at length with the patient today.  Concerns regarding medicines are outlined above.  Orders Placed This Encounter  Procedures   Lipid panel   Meds ordered this encounter  Medications   metoprolol  succinate (TOPROL  XL) 25 MG 24 hr tablet    Sig: Take 1 tablet (25 mg total) by mouth daily.    Dispense:  90 tablet    Refill:  3   atorvastatin  (LIPITOR) 20 MG tablet    Sig: Take 1 tablet (20 mg total) by mouth daily.    Dispense:  90 tablet    Refill:  3    Patient Instructions  Medication Instructions:  Start Atorvastatin  20 mg daily Start Metoprolol  succinate (Toprol  XL) 25 mg daily *If you need a refill on your cardiac medications before your next appointment, please call your pharmacy*  Lab Work: lipid 3 MONTHS If you have labs (blood work) drawn today and your tests are completely normal, you will receive your results only by: MyChart Message (if you have MyChart) OR A paper copy in the mail If you have any lab test that is abnormal or we need to change your treatment, we will call you to review the results.  Testing/Procedures: ITAMAR someone will call with pin  Follow-Up: At [facility], you and your health needs are our priority.  As part of our continuing mission to provide you with exceptional heart care, our providers are all part of one team.  This team includes your primary Cardiologist (physician) and Advanced Practice Providers or APPs (Physician Assistants and Nurse Practitioners) who all work together to provide you with the care you need, when you need it.  Your next  appointment:   3 months  Provider:   Dr.Thurmond Hildebran  We recommend  signing up for the patient portal called MyChart.  Sign up information is provided on this After Visit Summary.  MyChart is used to connect with patients for Virtual Visits (Telemedicine).  Patients are able to view lab/test results, encounter notes, upcoming appointments, etc.  Non-urgent messages can be sent to your provider as well.   To learn more about what you can do with MyChart, go to [email or web address].   Other Instructions none       Signed, Lonni LITTIE Nanas, MD  11/02/2023 11:51 AM     [facility]

## 2023-11-02 ENCOUNTER — Encounter: Payer: Self-pay | Admitting: Cardiology

## 2023-11-02 ENCOUNTER — Telehealth: Payer: Self-pay

## 2023-11-02 ENCOUNTER — Ambulatory Visit: Attending: Cardiology | Admitting: Cardiology

## 2023-11-02 VITALS — BP 130/78 | HR 74 | Ht 67.0 in | Wt 243.2 lb

## 2023-11-02 DIAGNOSIS — E785 Hyperlipidemia, unspecified: Secondary | ICD-10-CM | POA: Diagnosis not present

## 2023-11-02 DIAGNOSIS — R002 Palpitations: Secondary | ICD-10-CM | POA: Diagnosis not present

## 2023-11-02 DIAGNOSIS — I493 Ventricular premature depolarization: Secondary | ICD-10-CM | POA: Diagnosis not present

## 2023-11-02 DIAGNOSIS — R0683 Snoring: Secondary | ICD-10-CM

## 2023-11-02 DIAGNOSIS — I10 Essential (primary) hypertension: Secondary | ICD-10-CM

## 2023-11-02 DIAGNOSIS — I2510 Atherosclerotic heart disease of native coronary artery without angina pectoris: Secondary | ICD-10-CM

## 2023-11-02 MED ORDER — METOPROLOL SUCCINATE ER 25 MG PO TB24: 25.0000 mg | ORAL_TABLET | Freq: Every day | ORAL | 3 refills | Status: DC

## 2023-11-02 MED ORDER — ATORVASTATIN CALCIUM 20 MG PO TABS: 20.0000 mg | ORAL_TABLET | Freq: Every day | ORAL | 3 refills | Status: DC

## 2023-11-02 NOTE — Patient Instructions (Addendum)
 Medication Instructions:  Start Atorvastatin  20 mg daily Start Metoprolol  succinate (Toprol  XL) 25 mg daily *If you need a refill on your cardiac medications before your next appointment, please call your pharmacy*  Lab Work: lipid 3 MONTHS If you have labs (blood work) drawn today and your tests are completely normal, you will receive your results only by: MyChart Message (if you have MyChart) OR A paper copy in the mail If you have any lab test that is abnormal or we need to change your treatment, we will call you to review the results.  Testing/Procedures: ITAMAR someone will call with pin  Follow-Up: At [facility], you and your health needs are our priority.  As part of our continuing mission to provide you with exceptional heart care, our providers are all part of one team.  This team includes your primary Cardiologist (physician) and Advanced Practice Providers or APPs (Physician Assistants and Nurse Practitioners) who all work together to provide you with the care you need, when you need it.  Your next appointment:   3 months  Provider:   Dr.Schumann  We recommend signing up for the patient portal called MyChart.  Sign up information is provided on this After Visit Summary.  MyChart is used to connect with patients for Virtual Visits (Telemedicine).  Patients are able to view lab/test results, encounter notes, upcoming appointments, etc.  Non-urgent messages can be sent to your provider as well.   To learn more about what you can do with MyChart, go to [email or web address].   Other Instructions none

## 2023-11-02 NOTE — Telephone Encounter (Signed)
**Note De-Identified Veronica Hunter Obfuscation** Ordering provider: Dr Kate Associated diagnoses: Snoring-R06.83 and Somnolence-R40.0 WatchPAT PA obtained on 11/02/2023 by Veronica Hunter, Veronica HERO, LPN. Authorization: Per the [facility] Provider Portal a PA is not required for a Itamar-HST.     Patient notified of PIN ([id number]) on 11/02/2023 Veronica Hunter Notification Method: phone. Per the pts request, I have sent her the Pin # for her WatchPAT One-HST device Veronica Hunter a [facility] message   Phone note routed to covering staff for follow-up.

## 2023-11-05 ENCOUNTER — Ambulatory Visit: Admitting: Cardiology

## 2023-11-09 ENCOUNTER — Telehealth: Payer: Self-pay | Admitting: Physician Assistant

## 2023-11-09 NOTE — Telephone Encounter (Signed)
 The pt has been made aware that cardiology has approved her for procedure as long as she has no change in symptoms.  She states that there are no changes and she would like to reschedule the endo colon. Pt transferred to the schedulers to set up. She will call if she has any changes  in symptoms prior to the procedures

## 2023-11-09 NOTE — Telephone Encounter (Signed)
 PT called to cancel EGD on 9/18. 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 PT is requesting to speak with a nurse to find out what she should do in this ordeal. Please advise.

## 2023-11-10 ENCOUNTER — Encounter: Admitting: Internal Medicine

## 2023-11-18 ENCOUNTER — Encounter: Payer: Self-pay | Admitting: Internal Medicine

## 2023-11-22 ENCOUNTER — Telehealth: Payer: Self-pay | Admitting: Internal Medicine

## 2023-11-22 NOTE — Telephone Encounter (Signed)
 The following PT is scheduled for an EGD on 9/18. Her husband has covid and she is only exhibiting some drainage at the moment. She wants to know if she can proceed with appointment. Please advise.

## 2023-11-22 NOTE — Telephone Encounter (Signed)
 She needs to monitor - can keep on the books but she needs to update us  on 9/17 with symptoms - I will not charge a cancellation fee if we do  But if she wants to reschedule that is ok also

## 2023-11-22 NOTE — Telephone Encounter (Signed)
 Patient chooses to reschedule. She is without a care partner with her husband sick. She would like to reschedule to next week. No openings. Scheduled for 12/21/23 now.

## 2023-11-25 ENCOUNTER — Encounter: Admitting: Internal Medicine

## 2023-11-26 ENCOUNTER — Encounter: Payer: Self-pay | Admitting: Cardiology

## 2023-11-30 ENCOUNTER — Other Ambulatory Visit: Payer: Self-pay | Admitting: *Deleted

## 2023-11-30 MED ORDER — METOPROLOL SUCCINATE ER 50 MG PO TB24: 50.0000 mg | ORAL_TABLET | Freq: Every day | ORAL | 3 refills | Status: DC

## 2023-11-30 NOTE — Progress Notes (Signed)
 Called and made patient aware Dr. Kate would like for her to increase Toprol  XL 50 mg a day. Patient verbalized an understanding but requested an appointment with the provider to discuss medication dose increase and additional symptoms. Advise patient to send mychart message. Appointment made per patient request. .

## 2023-11-30 NOTE — Telephone Encounter (Signed)
 Recommend increasing Toprol -XL dose to 50 mg daily

## 2023-12-03 ENCOUNTER — Other Ambulatory Visit: Payer: Self-pay | Admitting: Radiology

## 2023-12-03 DIAGNOSIS — N951 Menopausal and female climacteric states: Secondary | ICD-10-CM

## 2023-12-06 ENCOUNTER — Other Ambulatory Visit: Payer: Self-pay | Admitting: Radiology

## 2023-12-06 DIAGNOSIS — N951 Menopausal and female climacteric states: Secondary | ICD-10-CM

## 2023-12-06 NOTE — Telephone Encounter (Signed)
 Med refill request:  estradiol  (vivelle -dot) 0.05 mg/24 hr patch Start: 09/16/23 - Dispense: #24 patches with 0 refills  Last AEX:  10/28/22 LOV: 07/26/23 - Saw T. Prentiss, NP Next AEX:  Not yet scheduled Last MMG (if hormonal med):  12/22/22 Refill authorized? Please Advise.

## 2023-12-06 NOTE — Telephone Encounter (Signed)
 Med refill request: Vivelle   Last AEX: 10/28/22 Next AEX: not scheduled  Last MMG (if hormonal med) Refill authorized: Refill not appropriate, duplicate request.  last rx 12/06/23 #24 with 0 refills. Routing to provider for review

## 2023-12-10 ENCOUNTER — Encounter: Payer: Self-pay | Admitting: Cardiology

## 2023-12-10 ENCOUNTER — Ambulatory Visit: Attending: Cardiology | Admitting: Cardiology

## 2023-12-10 VITALS — BP 140/72 | HR 77 | Ht 67.0 in | Wt 243.3 lb

## 2023-12-10 DIAGNOSIS — R002 Palpitations: Secondary | ICD-10-CM | POA: Diagnosis not present

## 2023-12-10 DIAGNOSIS — I341 Nonrheumatic mitral (valve) prolapse: Secondary | ICD-10-CM

## 2023-12-10 DIAGNOSIS — R079 Chest pain, unspecified: Secondary | ICD-10-CM

## 2023-12-10 DIAGNOSIS — I493 Ventricular premature depolarization: Secondary | ICD-10-CM

## 2023-12-10 DIAGNOSIS — I10 Essential (primary) hypertension: Secondary | ICD-10-CM

## 2023-12-10 DIAGNOSIS — I2510 Atherosclerotic heart disease of native coronary artery without angina pectoris: Secondary | ICD-10-CM | POA: Diagnosis not present

## 2023-12-10 DIAGNOSIS — E785 Hyperlipidemia, unspecified: Secondary | ICD-10-CM

## 2023-12-10 DIAGNOSIS — R0683 Snoring: Secondary | ICD-10-CM

## 2023-12-10 MED ORDER — METOPROLOL TARTRATE 25 MG PO TABS: 12.5000 mg | ORAL_TABLET | ORAL | 3 refills | Status: AC | PRN

## 2023-12-10 NOTE — Progress Notes (Signed)
 Cardiology Office Note:    Date:  12/10/2023   ID:  Veronica Hunter, DOB 1970-04-20, MRN [id number]  PCP:  Macarthur Elouise SQUIBB, DO  Cardiologist:  Lonni LITTIE Nanas, MD  Electrophysiologist:  None   Referring MD: Lazoff, Shawn P, DO   Chief Complaint  Patient presents with   Coronary Artery Disease    History of Present Illness:    Veronica Hunter is a 53 y.o. female with a hx of fibromyalgia, GERD, reported history of MVP who presents for follow-up.  She was seen by Dr. Alvan for preop evaluation 10/06/2022.  She reported chest pain and palpitations.  Had been seen in the ED on 08/06/2022, workup unremarkable, including negative troponins.  She had a calcium  score of 0 in 2022.  Exercise Myoview was ordered but was denied by insurance.  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  Echocardiogram 01/01/2023 showed EF 60 to 65%, grade 1 diastolic dysfunction, normal RV function, mild mitral valve prolapse with trivial mitral regurgitation.  Zio patch x 7 days 08/2023 showed 3 episodes of SVT with longest lasting 15 seconds, symptoms corresponded to PVCs.   Since last clinic visit, she reports she is doing okay.  She continues to have thumping feeling in chest after exercise.  Also chest pain with this.  Has improved some with metoprolol  but continues to have issues with this.  Reports BP 110s to 120s when she checks at home.  BP Readings from Last 3 Encounters:  12/10/23 (!) 140/72  11/02/23 130/78  10/27/23 (!) 157/89     Past Medical History:  Diagnosis Date   Anxiety    Back pain    Bilateral swelling of feet    Breast calcifications on mammogram    bilateral; watching, will have follow-up every 6 months for 2 yrs   Chest pain    Constipation    Diverticulitis    Dizziness    Fibromyalgia    Gallbladder problem    GERD (gastroesophageal reflux disease)    H/O fracture 2020   left  foot, right ankle   Headache    Heartburn    High blood pressure    Indigestion    Joint pain    Multiple food allergies    Palpitations    Vitamin B12 deficiency    Vitamin D  deficiency     Past Surgical History:  Procedure Laterality Date   GALLBLADDER SURGERY  1996   right ankle fracture surgery  2020   2 plates/10 screws    right ankle tendon repair  2010   TONSILLECTOMY  1996   TOTAL VAGINAL HYSTERECTOMY  2009    Current Medications: Current Meds  Medication Sig   EPINEPHrine  0.3 mg/0.3 mL IJ SOAJ injection    estradiol  (VIVELLE -DOT) 0.05 MG/24HR patch APPLY 1 PATCH(0.05 MG) TOPICALLY TO THE SKIN 2 TIMES A WEEK   metoprolol  succinate (TOPROL  XL) 25 MG 24 hr tablet Take 1 tablet (25 mg total) by mouth daily.   metoprolol  tartrate (LOPRESSOR ) 25 MG tablet Take 0.5 tablets (12.5 mg total) by mouth as needed. Daily as needed   [DISCONTINUED] metoprolol  succinate (TOPROL  XL) 50 MG 24 hr tablet Take 1 tablet (50 mg total) by mouth daily. Take with or immediately following a meal.     Allergies:   Cephalosporins, Epinephrine , Escitalopram oxalate, No healthtouch food allergies, Shellfish allergy , and Sudafed [pseudoephedrine]   Social History   Socioeconomic History  Marital status: Married    Spouse name: Not on file   Number of children: 2   Years of education: Not on file   Highest education level: Bachelor's degree (e.g., BA, AB, BS)  Occupational History   Occupation: Part time Music therapist  Tobacco Use   Smoking status: Never    Passive exposure: Never   Smokeless tobacco: Never  Vaping Use   Vaping status: Never Used  Substance and Sexual Activity   Alcohol use: Never   Drug use: Never   Sexual activity: Yes    Partners: Male    Birth control/protection: Surgical    Comment: hysterectomy, menarche 53yo, sexual debut 53yo  Other Topics Concern   Not on file  Social History Narrative   Lives at home with spouse & daughter   Right handed    Caffeine: 2 glasses of sweet tea/day   Social Drivers of Corporate investment banker Strain: Not on file  Food Insecurity: Low Risk  (10/26/2023)   Received from [facility]   Hunger Vital Sign    Within the past 12 months, you worried that your food would run out before you got money to buy more: Never true    Within the past 12 months, the food you bought just didn't last and you didn't have money to get more. : Never true  Transportation Needs: No Transportation Needs (10/26/2023)   Received from [facility]    In the past 12 months, has lack of reliable transportation kept you from medical appointments, meetings, work or from getting things needed for daily living? : No  Physical Activity: Not on file  Stress: Not on file  Social Connections: Not on file     Family History: The patient's family history includes Allergic rhinitis in her mother, paternal aunt, and son; Cancer in her mother; Diabetes in her maternal grandmother and mother; Food Allergy  in her son; Heart Problems in her mother; Heart attack in her mother; Heart disease in her mother; Hyperlipidemia in her mother; Hypertension in her mother; Sinusitis in her mother, paternal aunt, and son; Sudden death in her father and mother; Urticaria in her mother. There is no history of Migraines, Colon cancer, Esophageal cancer, Stomach cancer, Asthma, Immunodeficiency, Atopy, Angioedema, or Eczema.  ROS:   Please see the history of present illness.     All other systems reviewed and are negative.  EKGs/Labs/Other Studies Reviewed:    The following studies were reviewed today:   EKG:   08/06/2022: Normal sinus rhythm, rate 76, Q waves in inferior leads and V1-4 12/10/23: NSR, PVCs, LVH, Q waves in inferior leads and V1-4  Recent Labs: 10/27/2023: ALT 24; BUN 16; Creatinine, Ser 0.78; Hemoglobin 12.9; Magnesium 2.3; Platelets 175; Potassium 4.0; Sodium 137  Recent Lipid Panel    Component Value Date/Time    CHOL 158 12/11/2022 1033   TRIG 90 12/11/2022 1033   HDL 41 12/11/2022 1033   CHOLHDL 3.9 12/11/2022 1033   LDLCALC 100 (H) 12/11/2022 1033    Physical Exam:    VS:  BP (!) 140/72   Pulse 77   Ht 5' 7 (1.702 m)   Wt 243 lb 4.8 oz (110.4 kg)   SpO2 99%   BMI 38.11 kg/m     Wt Readings from Last 3 Encounters:  12/10/23 243 lb 4.8 oz (110.4 kg)  11/02/23 243 lb 3.2 oz (110.3 kg)  09/16/23 243 lb 6.4 oz (110.4 kg)     GEN:  Well nourished, well developed in no acute distress HEENT: Normal NECK: No JVD; No carotid bruits LYMPHATICS: No lymphadenopathy CARDIAC: RRR, no murmurs, rubs, gallops RESPIRATORY:  Clear to auscultation without rales, wheezing or rhonchi  ABDOMEN: Soft, non-tender, non-distended MUSCULOSKELETAL:  No edema; No deformity  SKIN: Warm and dry NEUROLOGIC:  Alert and oriented x 3 PSYCHIATRIC:  Normal affect   ASSESSMENT:    1. PVC's (premature ventricular contractions)   2. Palpitations   3. Coronary artery disease involving native coronary artery of native heart without angina pectoris   4. Essential hypertension   5. Mitral valve prolapse   6. Hyperlipidemia, unspecified hyperlipidemia type   7. Snoring   8. Chest pain of uncertain etiology     PLAN:    Palpitations/PVCs: Zio patch x 7 days 08/2023 showed 3 episodes of SVT with longest lasting 15 seconds.  Symptoms corresponded to PVCs.  ED visit 10/2023 with palpitations, appeared to correspond to PVCs - Continue Toprol -XL 25 mg daily.  She is requesting an extra dose to take before exercise, would prescribe as needed Lopressor  12.5 mg.  Given her worsening symptoms with exercise, query whether she is having arrhythmias triggered by exercise, will evaluate with ETT  CAD: Reported exertional chest pain.  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 - Had recommended starting atorvastatin  but she did not start.   Recommend starting atorvastatin  20 mg daily   Mitral valve prolapse: Echocardiogram 01/01/2023 showed EF 60 to 65%, grade 1 diastolic dysfunction, normal RV function, mild mitral valve prolapse with trivial mitral regurgitation.   Hypertension: Continue Toprol -XL  Hyperlipidemia: LDL 100 on 12/11/22.  Coronary CTA results as above.  Start atorvastatin  20 mg daily and check fasting lipid panel in 3 months  Snoring/daytime somnolence: Check Itamar sleep study.  STOP-BANG 4.    RTC in 4 months  Informed Consent   Shared Decision Making/Informed Consent The risks [chest pain, shortness of breath, cardiac arrhythmias, dizziness, blood pressure fluctuations, myocardial infarction, stroke/transient ischemic attack, and life-threatening complications (estimated to be 1 in 10,000)], benefits (risk stratification, diagnosing coronary artery disease, treatment guidance) and alternatives of an exercise tolerance test were discussed in detail with Ms. Shellenbarger and she agrees to proceed.      Medication Adjustments/Labs and Tests Ordered: Current medicines are reviewed at length with the patient today.  Concerns regarding medicines are outlined above.  Orders Placed This Encounter  Procedures   EXERCISE TOLERANCE TEST (ETT)   EKG 12-Lead   Meds ordered this encounter  Medications   metoprolol  tartrate (LOPRESSOR ) 25 MG tablet    Sig: Take 0.5 tablets (12.5 mg total) by mouth as needed. Daily as needed    Dispense:  90 tablet    Refill:  3    Patient Instructions  Medication Instructions:  Start Metoprolol  tartrate 12.5 mg as needed daily Continue all current medications *If you need a refill on your cardiac medications before your next appointment, please call your pharmacy*  Lab Work: none If you have labs (blood work) drawn today and your tests are completely normal, you will receive your results only by: MyChart Message (if you have MyChart) OR A paper copy in the mail If you have any  lab test that is abnormal or we need to change your treatment, we will call you to review the results.  Testing/Procedures: Your physician has requested that you have an Exercise Stress Test. An exercise tolerance test  is a test to check how your heart works during exercise. You will need to walk on a treadmill for this test. An electrocardiogram (ECG) will record your heartbeat when you are at rest and when you are exercising.    Please arrive 15 minutes prior to your appointment time for registration and insurance purposes.   The test will take approximately 45 minutes to complete.   How to prepare for your Exercise Stress Test: Do bring a list of your current medications with you.  If not listed below, you may take your medications as normal. Do wear comfortable clothes (no dresses or overalls) and walking shoes, tennis shoes preferred (no heels or open toed shoes are allowed) Do Not wear cologne, perfume, aftershave or lotions (deodorant is allowed). Do not drink or eat foods with caffeine for 24 hours before the test. (Chocolate, coffee, tea, or energy drinks) If you use an inhaler, bring it with you to the test. Do not smoke for 4 hours before the test.    If these instructions are not followed, your test will have to be rescheduled.   If you cannot keep your appointment, please provide 24 hours notification to our office, to avoid a possible $50 charge to your account.     Follow-Up: At [facility], you and your health needs are our priority.  As part of our continuing mission to provide you with exceptional heart care, our providers are all part of one team.  This team includes your primary Cardiologist (physician) and Advanced Practice Providers or APPs (Physician Assistants and Nurse Practitioners) who all work together to provide you with the care you need, when you need it.  Your next appointment:   4 month(s)  Provider:   Lonni LITTIE Nanas, MD    We  recommend signing up for the patient portal called MyChart.  Sign up information is provided on this After Visit Summary.  MyChart is used to connect with patients for Virtual Visits (Telemedicine).  Patients are able to view lab/test results, encounter notes, upcoming appointments, etc.  Non-urgent messages can be sent to your provider as well.   To learn more about what you can do with MyChart, go to [email or web address].   Other Instructions none           Signed, Lonni LITTIE Nanas, MD  12/10/2023 2:59 PM    [facility]

## 2023-12-10 NOTE — Patient Instructions (Signed)
 Medication Instructions:  Start Metoprolol  tartrate 12.5 mg as needed daily Continue all current medications *If you need a refill on your cardiac medications before your next appointment, please call your pharmacy*  Lab Work: none If you have labs (blood work) drawn today and your tests are completely normal, you will receive your results only by: MyChart Message (if you have MyChart) OR A paper copy in the mail If you have any lab test that is abnormal or we need to change your treatment, we will call you to review the results.  Testing/Procedures: Your physician has requested that you have an Exercise Stress Test. An exercise tolerance test is a test to check how your heart works during exercise. You will need to walk on a treadmill for this test. An electrocardiogram (ECG) will record your heartbeat when you are at rest and when you are exercising.    Please arrive 15 minutes prior to your appointment time for registration and insurance purposes.   The test will take approximately 45 minutes to complete.   How to prepare for your Exercise Stress Test: Do bring a list of your current medications with you.  If not listed below, you may take your medications as normal. Do wear comfortable clothes (no dresses or overalls) and walking shoes, tennis shoes preferred (no heels or open toed shoes are allowed) Do Not wear cologne, perfume, aftershave or lotions (deodorant is allowed). Do not drink or eat foods with caffeine for 24 hours before the test. (Chocolate, coffee, tea, or energy drinks) If you use an inhaler, bring it with you to the test. Do not smoke for 4 hours before the test.    If these instructions are not followed, your test will have to be rescheduled.   If you cannot keep your appointment, please provide 24 hours notification to our office, to avoid a possible $50 charge to your account.     Follow-Up: At [facility], you and your health needs are our  priority.  As part of our continuing mission to provide you with exceptional heart care, our providers are all part of one team.  This team includes your primary Cardiologist (physician) and Advanced Practice Providers or APPs (Physician Assistants and Nurse Practitioners) who all work together to provide you with the care you need, when you need it.  Your next appointment:   4 month(s)  Provider:   Lonni LITTIE Nanas, MD    We recommend signing up for the patient portal called MyChart.  Sign up information is provided on this After Visit Summary.  MyChart is used to connect with patients for Virtual Visits (Telemedicine).  Patients are able to view lab/test results, encounter notes, upcoming appointments, etc.  Non-urgent messages can be sent to your provider as well.   To learn more about what you can do with MyChart, go to [email or web address].   Other Instructions none

## 2023-12-20 NOTE — Progress Notes (Addendum)
 [facility] History and Physical   Primary Care Physician:  Lazoff, Shawn P, DO   Reason for Procedure:  Chest pain issues question GERD question esophageal spasm  Plan:    EGD   NOTE: She had a negative Cologuard in 2024 so does not need a screening colonoscopy at this time  HPI: Veronica Hunter is a 53 y.o. female seen in the clinic in July 2025 by Delon Failing, PA-C.  At that time she was having chest pain issues she had been to cardiology and has since been back there and pain is not thought to be cardiac in origin.  She has tried and not completely responded to PPI and other acid suppression.  She was prescribed beriberine supplement by integrative health doctor and that was helping quite a bit at 1 point but she had persistent issues and we called in hyoscyamine .  She has not described dysphagia.  She also has fibromyalgia diagnosis but does not have a lot of body aches  Today she describes some nocturnal heartburn often but not always relieved by antacids (lying down is trigger, she has a relatively chronic upper abdominal ache and is always tender. She also has palpitations and abdominal and chest thumping which seems beter w/ treatment w/ metoprolol .  She has had variable responses to PPi.  She says most of these symptoms after a pregnancy 24 yrs ago  complicated by hyperemesis and sxs increased in past 2-3 years.  Saw cardiology in early October (this month) the chest pain of unclear etiology.  She described a thumping chest pain after exercise.  She also has palpitations and a Zio patch is ordered.   Wt Readings from Last 3 Encounters:  12/21/23 243 lb 4.8 oz (110.4 kg)  12/10/23 243 lb 4.8 oz (110.4 kg)  11/02/23 243 lb 3.2 oz (110.3 kg)     Past Medical History:  Diagnosis Date   Anxiety    Back pain    Bilateral swelling of feet    Breast calcifications on mammogram    bilateral; watching, will have follow-up every 6 months for 2 yrs   Chest pain     Constipation    Diverticulitis    Dizziness    Fibromyalgia    Gallbladder problem    GERD (gastroesophageal reflux disease)    H/O fracture 2020   left foot, right ankle   Headache    Heartburn    High blood pressure    Indigestion    Joint pain    Multiple food allergies    Palpitations    Vitamin B12 deficiency    Vitamin D  deficiency     Past Surgical History:  Procedure Laterality Date   GALLBLADDER SURGERY  1996   right ankle fracture surgery  2020   2 plates/10 screws    right ankle tendon repair  2010   TONSILLECTOMY  1996   TOTAL VAGINAL HYSTERECTOMY  2009     Current Outpatient Medications  Medication Sig Dispense Refill   estradiol  (VIVELLE -DOT) 0.05 MG/24HR patch APPLY 1 PATCH(0.05 MG) TOPICALLY TO THE SKIN 2 TIMES A WEEK 24 patch 0   metoprolol  succinate (TOPROL  XL) 25 MG 24 hr tablet Take 1 tablet (25 mg total) by mouth daily.     Multiple Vitamin (MULTIVITAMIN) tablet Take 1 tablet by mouth daily.     atorvastatin  (LIPITOR) 20 MG tablet Take 1 tablet (20 mg total) by mouth daily. (Patient not taking: Reported on 12/10/2023) 90 tablet 3   BERBERINE CHLORIDE  PO Take 450 mg by mouth.  Take 450 mg by mouth 2 (two) times a day. (Patient not taking: Reported on 12/10/2023)     EPINEPHrine  0.3 mg/0.3 mL IJ SOAJ injection      hyoscyamine  (LEVSIN  SL) 0.125 MG SL tablet Place 1 tablet (0.125 mg total) under the tongue every 6 (six) hours as needed. (Patient not taking: Reported on 12/10/2023) 60 tablet 1   ibuprofen (ADVIL) 800 MG tablet Take 800 mg by mouth 3 (three) times daily as needed. (Patient not taking: No sig reported)     metoprolol  tartrate (LOPRESSOR ) 25 MG tablet Take 0.5 tablets (12.5 mg total) by mouth as needed. Daily as needed 90 tablet 3   NONFORMULARY OR COMPOUNDED ITEM Apply pea sized amount to inner thigh at bedtime daily (Patient not taking: No sig reported) 30 each 5   nystatin  (MYCOSTATIN /NYSTOP ) powder Apply 1 Application topically 2 (two)  times daily. (Patient not taking: No sig reported) 30 g 1   omeprazole  (PRILOSEC) 20 MG capsule Take 1 capsule (20 mg total) by mouth daily. (Patient not taking: No sig reported) 30 capsule 1   sucralfate  (CARAFATE ) 1 g tablet Take 1 tablet (1 g total) by mouth 4 (four) times daily as needed. (Patient not taking: No sig reported) 30 tablet 0   VITAMIN D  PO Take by mouth daily. (Patient not taking: No sig reported)     Current Facility-Administered Medications  Medication Dose Route Frequency Provider Last Rate Last Admin   0.9 %  sodium chloride infusion  500 mL Intravenous Continuous Avram Lupita BRAVO, MD        Allergies as of 12/21/2023 - Review Complete 12/21/2023  Allergen Reaction Noted   Shellfish allergy  Other (See Comments) 11/06/2022   Cephalosporins Rash 02/12/2021   Epinephrine  Other (See Comments) 04/04/2020   Escitalopram oxalate Other (See Comments) 08/19/2022   No healthtouch food allergies Rash 12/10/2021   Sudafed [pseudoephedrine] Other (See Comments) 04/04/2020    Family History  Problem Relation Age of Onset   Sinusitis Mother    Urticaria Mother    Allergic rhinitis Mother    Hyperlipidemia Mother    Hypertension Mother    Heart attack Mother    Cancer Mother        uterine possibly   Diabetes Mother    Heart Problems Mother    Heart disease Mother    Sudden death Mother    Sudden death Father    Allergic rhinitis Paternal Aunt    Sinusitis Paternal Aunt    Diabetes Maternal Grandmother    Sinusitis Son    Allergic rhinitis Son    Food Allergy  Son    Migraines Neg Hx    Colon cancer Neg Hx    Esophageal cancer Neg Hx    Stomach cancer Neg Hx    Asthma Neg Hx    Immunodeficiency Neg Hx    Atopy Neg Hx    Angioedema Neg Hx    Eczema Neg Hx    Rectal cancer Neg Hx     Social History   Socioeconomic History   Marital status: Married    Spouse name: Not on file   Number of children: 2   Years of education: Not on file   Highest education  level: Bachelor's degree (e.g., BA, AB, BS)  Occupational History   Occupation: Part time Music therapist  Tobacco Use   Smoking status: Never    Passive exposure: Never   Smokeless tobacco: Never  Vaping Use  Vaping status: Never Used  Substance and Sexual Activity   Alcohol use: Never   Drug use: Never   Sexual activity: Yes    Partners: Male    Birth control/protection: Surgical    Comment: hysterectomy, menarche 53yo, sexual debut 53yo  Other Topics Concern   Not on file  Social History Narrative   Lives at home with spouse & daughter   Right handed   Caffeine: 2 glasses of sweet tea/day   Social Drivers of Corporate investment banker Strain: Not on file  Food Insecurity: Low Risk  (10/26/2023)   Received from [facility]   Hunger Vital Sign    Within the past 12 months, you worried that your food would run out before you got money to buy more: Never true    Within the past 12 months, the food you bought just didn't last and you didn't have money to get more. : Never true  Transportation Needs: No Transportation Needs (10/26/2023)   Received from [facility]    In the past 12 months, has lack of reliable transportation kept you from medical appointments, meetings, work or from getting things needed for daily living? : No  Physical Activity: Not on file  Stress: Not on file  Social Connections: Not on file  Intimate Partner Violence: Not on file    Review of Systems:  All other review of systems negative except as mentioned in the HPI.  Physical Exam: Vital signs BP 136/86   Pulse 68   Temp 98 F (36.7 C) (Temporal)   Ht 5' 7 (1.702 m)   Wt 243 lb 4.8 oz (110.4 kg)   SpO2 100%   BMI 38.11 kg/m   General:   Alert,  Well-developed, well-nourished, pleasant and cooperative in NAD Lungs:  Clear throughout to auscultation.   Heart:  Regular rate and rhythm; no murmurs, clicks, rubs,  or gallops. Abdomen:  Soft, sl tender epigastrium and  nondistended. Normal bowel sounds.   Neuro/Psych:  Alert and cooperative. Normal mood and affect. A and O x 3   @Johnika Escareno  CHARLENA Commander, MD, [facility] [phone] (pager) 12/21/2023 10:17 AM@

## 2023-12-21 ENCOUNTER — Encounter: Payer: Self-pay | Admitting: Internal Medicine

## 2023-12-21 ENCOUNTER — Ambulatory Visit: Admitting: Internal Medicine

## 2023-12-21 VITALS — BP 125/68 | HR 64 | Temp 98.0°F | Resp 14 | Ht 67.0 in | Wt 243.3 lb

## 2023-12-21 DIAGNOSIS — K317 Polyp of stomach and duodenum: Secondary | ICD-10-CM

## 2023-12-21 DIAGNOSIS — R0789 Other chest pain: Secondary | ICD-10-CM

## 2023-12-21 DIAGNOSIS — K219 Gastro-esophageal reflux disease without esophagitis: Secondary | ICD-10-CM

## 2023-12-21 DIAGNOSIS — K2970 Gastritis, unspecified, without bleeding: Secondary | ICD-10-CM | POA: Diagnosis not present

## 2023-12-21 DIAGNOSIS — K3189 Other diseases of stomach and duodenum: Secondary | ICD-10-CM

## 2023-12-21 DIAGNOSIS — K2100 Gastro-esophageal reflux disease with esophagitis, without bleeding: Secondary | ICD-10-CM | POA: Diagnosis present

## 2023-12-21 DIAGNOSIS — K2950 Unspecified chronic gastritis without bleeding: Secondary | ICD-10-CM | POA: Diagnosis not present

## 2023-12-21 DIAGNOSIS — G8929 Other chronic pain: Secondary | ICD-10-CM

## 2023-12-21 MED ORDER — SODIUM CHLORIDE 0.9 % IV SOLN: 500.0000 mL | INTRAVENOUS | Status: DC

## 2023-12-21 NOTE — Progress Notes (Signed)
 Sedate, gd SR, tolerated procedure well, VSS, report to RN

## 2023-12-21 NOTE — Progress Notes (Signed)
 Called to room to assist during endoscopic procedure.  Patient ID and intended procedure confirmed with present staff. Received instructions for my participation in the procedure from the performing physician.

## 2023-12-21 NOTE — Patient Instructions (Addendum)
 I took esophageal and stomach biopsies today.  There were changes of reflux in the esophagus and inflammation and a few polyps seen in the stomach.  Some of these findings may be related to symptoms but cannot say with certainty for all of your symptoms.  Will see what the pathology tells us  and make recommendations.  I do think you have some acid reflux problems.  I appreciate the opportunity to care for you. Lupita CHARLENA Commander, MD, [facility]    Handouts Provided:  GERD  YOU HAD AN ENDOSCOPIC PROCEDURE TODAY AT THE [facility]:   Refer to the procedure report that was given to you for any specific questions about what was found during the examination.  If the procedure report does not answer your questions, please call your gastroenterologist to clarify.  If you requested that your care partner not be given the details of your procedure findings, then the procedure report has been included in a sealed envelope for you to review at your convenience later.  YOU SHOULD EXPECT: Some feelings of bloating in the abdomen. Passage of more gas than usual.  Walking can help get rid of the air that was put into your GI tract during the procedure and reduce the bloating. If you had a lower endoscopy (such as a colonoscopy or flexible sigmoidoscopy) you may notice spotting of blood in your stool or on the toilet paper. If you underwent a bowel prep for your procedure, you may not have a normal bowel movement for a few days.  Please Note:  You might notice some irritation and congestion in your nose or some drainage.  This is from the oxygen used during your procedure.  There is no need for concern and it should clear up in a day or so.  SYMPTOMS TO REPORT IMMEDIATELY:  Following upper endoscopy (EGD)  Vomiting of blood or coffee ground material  New chest pain or pain under the shoulder blades  Painful or persistently difficult swallowing  New shortness of breath  Fever of 100F or  higher  Black, tarry-looking stools  For urgent or emergent issues, a gastroenterologist can be reached at any hour by calling [phone]. Do not use MyChart messaging for urgent concerns.    DIET:  We do recommend a small meal at first, but then you may proceed to your regular diet.  Drink plenty of fluids but you should avoid alcoholic beverages for 24 hours.  ACTIVITY:  You should plan to take it easy for the rest of today and you should NOT DRIVE or use heavy machinery until tomorrow (because of the sedation medicines used during the test).    FOLLOW UP: Our staff will call the number listed on your records the next business day following your procedure.  We will call around 7:15- 8:00 am to check on you and address any questions or concerns that you may have regarding the information given to you following your procedure. If we do not reach you, we will leave a message.     If any biopsies were taken you will be contacted by phone or by letter within the next 1-3 weeks.  Please call us  at [phone] if you have not heard about the biopsies in 3 weeks.    SIGNATURES/CONFIDENTIALITY: You and/or your care partner have signed paperwork which will be entered into your electronic medical record.  These signatures attest to the fact that that the information above on your After Visit Summary has been reviewed  and is understood.  Full responsibility of the confidentiality of this discharge information lies with you and/or your care-partner.

## 2023-12-21 NOTE — Op Note (Signed)
 [facility] Patient Name: Veronica Hunter Procedure Date: 12/21/2023 10:11 AM MRN: [id number] Endoscopist: Lupita FORBES Commander , MD, [id number] Age: 53 Referring MD:  Date of Birth: Jun 28, 1970 Gender: Female Account #: [id number] Procedure:                Upper GI endoscopy Indications:              Epigastric abdominal pain, Heartburn, Unexplained                            chest pain Medicines:                Monitored Anesthesia Care Procedure:                Pre-Anesthesia Assessment:                           - Prior to the procedure, a History and Physical                            was performed, and patient medications and                            allergies were reviewed. The patient's tolerance of                            previous anesthesia was also reviewed. The risks                            and benefits of the procedure and the sedation                            options and risks were discussed with the patient.                            All questions were answered, and informed consent                            was obtained. Prior Anticoagulants: The patient has                            taken no anticoagulant or antiplatelet agents. ASA                            Grade Assessment: II - A patient with mild systemic                            disease. After reviewing the risks and benefits,                            the patient was deemed in satisfactory condition to                            undergo the procedure.  After obtaining informed consent, the endoscope was                            passed under direct vision. Throughout the                            procedure, the patient's blood pressure, pulse, and                            oxygen saturations were monitored continuously. The                            GIF [id number] #[id number] was introduced through the                            mouth, and advanced to the second part of  duodenum.                            The upper GI endoscopy was accomplished without                            difficulty. The patient tolerated the procedure                            well. Scope In: Scope Out: Findings:                 LA Grade A (one or more mucosal breaks less than 5                            mm, not extending between tops of 2 mucosal folds)                            esophagitis with no bleeding was found in the                            distal esophagus. Biopsies were taken with a cold                            forceps for histology. Verification of patient                            identification for the specimen was done. Estimated                            blood loss was minimal.                           A few diminutive sessile polyps were found in the                            gastric fundus and in the gastric body. Biopsies  were taken with a cold forceps for histology.                            Verification of patient identification for the                            specimen was done. Estimated blood loss was minimal.                           Diffuse mild inflammation characterized by                            congestion (edema) and erythema was found in the                            gastric body and in the gastric antrum. Biopsies                            were taken with a cold forceps for histology.                            Verification of patient identification for the                            specimen was done. Estimated blood loss was minimal.                           The gastroesophageal flap valve was visualized                            endoscopically and classified as Hill Grade I                            (prominent fold, tight to endoscope).                           The exam was otherwise without abnormality.                           The cardia and gastric fundus were otherwise normal                             on retroflexion. Complications:            No immediate complications. Estimated Blood Loss:     Estimated blood loss was minimal. Impression:               - LA Grade A reflux esophagitis with no bleeding.                            Biopsied.                           - A few gastric polyps. Biopsied.                           -  Gastritis. Biopsied. Antral and body bottles -                            Sydney protocol used.                           - Gastroesophageal flap valve classified as Hill                            Grade I (prominent fold, tight to endoscope).                           - The examination was otherwise normal. Some of                            these findings can explain some symptoms but she                            also has a thumping pain that sounds more related                            to palpitations. See H and P from today also. Recommendation:           - Patient has a contact number available for                            emergencies. The signs and symptoms of potential                            delayed complications were discussed with the                            patient. Return to normal activities tomorrow.                            Written discharge instructions were provided to the                            patient.                           - GERD prevention diet.                           - Continue present medications.                           - Await pathology results. Lupita FORBES Commander, MD 12/21/2023 10:57:58 AM This report has been signed electronically.

## 2023-12-22 ENCOUNTER — Encounter: Admitting: Internal Medicine

## 2023-12-22 ENCOUNTER — Telehealth: Payer: Self-pay

## 2023-12-22 NOTE — Telephone Encounter (Signed)
  Follow up Call-     12/21/2023    9:12 AM  Call back number  Post procedure Call Back phone  # [phone]  Permission to leave phone message Yes     Patient questions:  Do you have a fever, pain , or abdominal swelling? No. Pain Score  0 *  Have you tolerated food without any problems? Yes.    Have you been able to return to your normal activities? Yes.    Do you have any questions about your discharge instructions: Diet   No. Medications  No. Follow up visit  No.  Do you have questions or concerns about your Care? Yes.   Patient reports having a metallic taste in her mouth and a funny feeling when she swallows, denies difficulty swallowing. Also has further questions about findings. Please advise.  Actions: * If pain score is 4 or above: No action needed, pain <4.

## 2023-12-22 NOTE — Telephone Encounter (Signed)
 Called pt to relay MD's response. No answer. Left a detailed message of Dr. Darilyn response.

## 2023-12-22 NOTE — Telephone Encounter (Signed)
 I do not know why she has a metallic taste Not something I have had happen after EGD in my patients Do not suspect it represents a problem  If she has questions about findings I ask that she send a My Chart message

## 2023-12-23 ENCOUNTER — Telehealth: Payer: Self-pay | Admitting: Internal Medicine

## 2023-12-23 ENCOUNTER — Encounter: Payer: Self-pay | Admitting: Internal Medicine

## 2023-12-23 NOTE — Telephone Encounter (Signed)
 I called the patient.  She has been having coughing paroxysms after the endoscopy but minimally productive but really mostly a dry cough.  No fever.  After having episodes of coughing she has been having some chest pain with cough.  She has used some cough drops with some benefit but she is having a lot of problems at night.  There is no dyspnea.  The endoscopy was uneventful and there was no suggestive aspiration and I do not think that is what is going on now.  I am not sure why she is having paroxysms that lead to gagging but suggest that we try to suppress the cough with a cough medicine with dextromethorphan but also suggested she see her primary care provider for an evaluation.

## 2023-12-24 LAB — SURGICAL PATHOLOGY

## 2023-12-25 ENCOUNTER — Ambulatory Visit: Payer: Self-pay | Admitting: Internal Medicine

## 2023-12-30 ENCOUNTER — Ambulatory Visit (HOSPITAL_COMMUNITY)
Admission: RE | Admit: 2023-12-30 | Discharge: 2023-12-30 | Disposition: A | Discharge status: Home / Self Care | Source: Ambulatory Visit | Attending: Internal Medicine | Admitting: Internal Medicine

## 2023-12-30 DIAGNOSIS — R079 Chest pain, unspecified: Secondary | ICD-10-CM | POA: Diagnosis present

## 2023-12-30 LAB — EXERCISE TOLERANCE TEST
Angina Index: 0
Duke Treadmill Score: 7
Estimated workload: 8.5
Exercise duration (min): 7 min
Exercise duration (sec): 0 s
MPHR: 166 {beats}/min
Peak HR: 144 {beats}/min
Percent HR: 86 %
Rest HR: 90 {beats}/min
ST Depression (mm): 0 mm

## 2023-12-31 ENCOUNTER — Telehealth: Payer: Self-pay | Admitting: *Deleted

## 2023-12-31 ENCOUNTER — Ambulatory Visit: Payer: Self-pay | Admitting: Cardiology

## 2023-12-31 DIAGNOSIS — I493 Ventricular premature depolarization: Secondary | ICD-10-CM

## 2023-12-31 NOTE — Telephone Encounter (Signed)
 Called and made patient aware per Dr. Kate pt wants to know if she should continue to exercise. Yoga piliates, weight and strength training. Made pt aware that Dr. Kate recommended lets hold off for now until get mri and see ep. walking is fine, Pt verbalized understanding.

## 2023-12-31 NOTE — Telephone Encounter (Signed)
 Called patient and gave Dr. Kate recommendations Increased PVCs during exercise, which likely explains her symptoms. Made patient aware that Dr. Kate recommended cardiac MRI  for further evaluations and referral to EP. Pt verbalized an understanding.

## 2024-01-03 ENCOUNTER — Telehealth: Payer: Self-pay | Admitting: Student

## 2024-01-03 NOTE — Telephone Encounter (Signed)
  Patient called after hours Answering Service. She started having more frequent PVCs earlier this afternoon after eating lunch. She also felt like she was having reflux. She took Mylanta but PVCs seemed to get worse with this. She took some deep breaths and PVCs slowed down some. However, she then started having some chest pressure around 1.5 hours ago.  She gets anxious with her PVCs and suspects this is likely contributing to her symptoms.  However, she wanted to know whether she should just take her evening dose of Toprol -XL or go to the emergency department.   She has been dealing with these symptoms for about a year now.  She had a coronary CTA in 01/2023 that showed a coronary calcium  score of 0 and mild nonobstructive disease.  She also had an ETT last week that showed no ST deviation during exercise but did show marked increase in PVCs and ventricular couplets during exercise.  Cardiac MRI was ordered and is scheduled for next week and she was referred to EP.  I discussed that I cannot tell her with 100% certainty that her chest pain is not anything concerning over the phone.  However, I have a low suspicion for this.  I think it is reasonable for her to go ahead and take her evening dose of Toprol -XL. If chest pressure continues after this, she may need to come to the emergency department for further evaluation.  Patient voiced understanding.  She also asked about her estradiol  patch.  She thinks symptoms may be correlated with this as they first started after her gynecologist prescribed this.  She usually applies a patch on Fridays and Mondays.  She applied a patch last Friday but realized earlier this morning that it had fallen off at some point.  Interestingly, she denied any PVCs or symptoms during this time.  She reapplied the patch this morning and then started having an increase in symptoms so she is wondering if this is the cause.  Explained that it be reasonable to do a trial off this and  recommended discussing this with her gynecologist.  She was very appreciative for the call.  Kharis Lapenna E Mohamadou Maciver, PA-C 01/03/2024 7:02 PM

## 2024-01-04 ENCOUNTER — Encounter: Payer: Self-pay | Admitting: Internal Medicine

## 2024-01-04 ENCOUNTER — Encounter: Payer: Self-pay | Admitting: Cardiology

## 2024-01-05 ENCOUNTER — Ambulatory Visit (INDEPENDENT_AMBULATORY_CARE_PROVIDER_SITE_OTHER): Admitting: Internal Medicine

## 2024-01-05 ENCOUNTER — Encounter: Payer: Self-pay | Admitting: Internal Medicine

## 2024-01-05 VITALS — BP 124/78 | HR 74 | Ht 67.0 in | Wt 239.0 lb

## 2024-01-05 DIAGNOSIS — R1013 Epigastric pain: Secondary | ICD-10-CM

## 2024-01-05 DIAGNOSIS — R053 Chronic cough: Secondary | ICD-10-CM

## 2024-01-05 DIAGNOSIS — R1310 Dysphagia, unspecified: Secondary | ICD-10-CM

## 2024-01-05 DIAGNOSIS — R002 Palpitations: Secondary | ICD-10-CM

## 2024-01-05 DIAGNOSIS — K219 Gastro-esophageal reflux disease without esophagitis: Secondary | ICD-10-CM | POA: Diagnosis not present

## 2024-01-05 MED ORDER — OMEPRAZOLE 40 MG PO CPDR: 40.0000 mg | DELAYED_RELEASE_CAPSULE | Freq: Two times a day (BID) | ORAL | 0 refills | Status: DC

## 2024-01-05 NOTE — Patient Instructions (Addendum)
  VISIT SUMMARY: During your visit, we discussed your ongoing issues with palpitations, GERD, chronic cough, and upper abdominal discomfort. We have adjusted your medications and recommended further tests to better understand and manage your symptoms.  YOUR PLAN: GASTROESOPHAGEAL REFLUX DISEASE (GERD) WITH ASSOCIATED CHRONIC COUGH AND UPPER ABDOMINAL SYMPTOMS: You have persistent symptoms of GERD, including burning in your throat and chest, chronic cough, and upper abdominal pain. Previous treatments have not consistently helped. -Start taking omeprazole  40 mg twice daily for two months. -We have ordered a barium swallow test to check your esophageal function. -If symptoms persist, we may consider implanting a temporary sensor during an endoscopy. -Follow dietary modifications and avoid known triggers. -Use water or candy to help with throat irritation and cough.  PREMATURE VENTRICULAR CONTRACTIONS (PVCS) POSSIBLY RELATED TO GERD: You have palpitations and chest thumping, which may be related to your GERD. Your current medication, metoprolol , has helped reduce these symptoms. -Continue taking metoprolol  as prescribed. -Monitor for improvement in your palpitations as we treat your GERD.  You have been scheduled for a Barium Esophogram at [facility] (1st floor of the hospital) on 01/19/2024 at 10:00am. Please arrive 20 minutes prior to your appointment for registration. If you need to reschedule for any reason, please contact radiology at [phone] to do so. __________________________________________________________________ A barium swallow is an examination that concentrates on views of the esophagus. This tends to be a double contrast exam (barium and two liquids which, when combined, create a gas to distend the wall of the oesophagus) or single contrast (non-ionic iodine based). The study is usually tailored to your symptoms so a good history is essential. Attention is paid during the  study to the form, structure and configuration of the esophagus, looking for functional disorders (such as aspiration, dysphagia, achalasia, motility and reflux) EXAMINATION You may be asked to change into a gown, depending on the type of swallow being performed. A radiologist and radiographer will perform the procedure. The radiologist will advise you of the type of contrast selected for your procedure and direct you during the exam. You will be asked to stand, sit or lie in several different positions and to hold a small amount of fluid in your mouth before being asked to swallow while the imaging is performed .In some instances you may be asked to swallow barium coated marshmallows to assess the motility of a solid food bolus. The exam can be recorded as a digital or video fluoroscopy procedure. POST PROCEDURE It will take 1-2 days for the barium to pass through your system. To facilitate this, it is important, unless otherwise directed, to increase your fluids for the next 24-48hrs and to resume your normal diet.  This test typically takes about 30 minutes to perform. __________________________________________________________________________________ 01/19/2024   I appreciate the opportunity to care for you. Lupita Commander, MD, [facility]                   Contains text generated by Abridge.                                 Contains text generated by Abridge.

## 2024-01-05 NOTE — Progress Notes (Signed)
 Veronica Hunter 53 y.o. [birth year] [id number]  Assessment & Plan:   Encounter Diagnoses  Name Primary?   Gastroesophageal reflux disease without esophagitis Yes   Dysphagia, unspecified type    Chronic cough    Palpitations    Dyspepsia        Gastroesophageal reflux disease (GERD) with associated chronic cough (can be related to GERD) and upper abdominal symptoms Endoscopy showed normal esophagus, mild inactive gastritis, and benign fundic gland polyps. Reflux may cause cardiac irritability. - Prescribed omeprazole  40 mg twice daily for two months. - Ordered barium swallow test to assess esophageal function. - Discussed option of implanting Bravo pH probe off PPI but patient has decided to pursue medical therapy with maximal acid suppression regimen - Advised on dietary modifications and avoidance of known triggers. - Recommended using water, cough drops or candy for throat irritation and cough in an effort to try to suppress the urge to cough or clear her throat  Premature ventricular contractions (PVCs) possibly related to GERD Metoprolol  reduced palpitations and lowered blood pressure. GERD treatment may alleviate cardiac symptoms if GERD is triggering arrhythmia - Continue metoprolol  as prescribed. - Monitor for improvement in PVCs with GERD treatment.  Orders Placed This Encounter  Procedures   DG ESOPHAGUS W DOUBLE CM (HD)   Meds ordered this encounter  Medications   omeprazole  (PRILOSEC) 40 MG capsule    Sig: Take 1 capsule (40 mg total) by mouth 2 (two) times daily before a meal. About 30 minutes before breakfast and supper    Dispense:  180 capsule    Refill:  0     CC: Lazoff, Shawn P, DO       Subjective:   Chief Complaint: Suspected GERD issues  HPI Discussed the use of AI scribe software for clinical note transcription with the patient, who gave verbal consent to proceed.   Veronica Hunter is a 53 year old female with palpitations and GERD who  presents with persistent chest thumping and cough.  Palpitations and chest thumping - Palpitations and sensation of chest thumping present for several years - Symptoms primarily occur at night when lying down, but recently also during the day - Official diagnosis of premature ventricular contractions (PVCs) this year after multiple EKGs and heart monitoring showed minor heart palpitations - Currently taking metoprolol   Gastroesophageal reflux symptoms - Burning sensation in throat and chest associated with reflux - Stomach pain accompanying reflux episodes - Symptoms often coincide with palpitations and chest thumping - Previous use of Pepcid, Protonix, and omeprazole  with inconsistent relief - Not currently taking any gastrointestinal medications  Chronic cough and throat clearing - Persistent cough present for years, often with need to clear throat - Recent intensification of cough, leading to gagging and dry heaving - Cough sometimes coincides with chest thumping and palpitations, particularly during reflux episodes  Upper abdominal discomfort and gas - Upper abdominal cramping and 'gassy pain' - Belching provides relief approximately 80% of the time - Elimination of carbonated beverages from diet - Regular bowel movements  Food allergies and hypersensitivity reactions-past medical history listed multiple food allergies but there is only a history of shellfish allergy  which was found on testing in 2024 but she was unaware of this issue. - History of hives, particularly around the neck triggers not identified (idiopathic) - Avoids seafood due to past adverse reaction during pregnancy with ingestion of tuna     Allergies  Allergen Reactions   Shellfish Allergy  Other (See Comments)    Allergy   tested at doctor's office   Cephalosporins Rash   Epinephrine  Other (See Comments)    really bad racing heart and everything that's not supposed to happen happens, occurs even with  epinephrine  in a numbing agent. Patient states years ago her dentist told her to put Epinephrine  on her allergy  list.   Escitalopram Oxalate Other (See Comments)    Did not like the way it made her feel.   No Healthtouch Food Allergies Rash    Mustard, Pork, and Chocolate: Migraines, rash, N/V   Sudafed [Pseudoephedrine] Other (See Comments)    Heart races   Current Meds  Medication Sig   EPINEPHrine  0.3 mg/0.3 mL IJ SOAJ injection    estradiol  (VIVELLE -DOT) 0.05 MG/24HR patch APPLY 1 PATCH(0.05 MG) TOPICALLY TO THE SKIN 2 TIMES A WEEK (Patient taking differently: As needed)   ibuprofen (ADVIL) 800 MG tablet Take 800 mg by mouth 3 (three) times daily as needed.   metoprolol  succinate (TOPROL  XL) 25 MG 24 hr tablet Take 1 tablet (25 mg total) by mouth daily.   metoprolol  tartrate (LOPRESSOR ) 25 MG tablet Take 0.5 tablets (12.5 mg total) by mouth as needed. Daily as needed   Multiple Vitamin (MULTIVITAMIN) tablet Take 1 tablet by mouth daily.   NONFORMULARY OR COMPOUNDED ITEM Apply pea sized amount to inner thigh at bedtime daily   nystatin  (MYCOSTATIN /NYSTOP ) powder Apply 1 Application topically 2 (two) times daily.   omeprazole  (PRILOSEC) 20 MG capsule Take 1 capsule (20 mg total) by mouth daily.   VITAMIN D  PO Take by mouth daily.   Past Medical History:  Diagnosis Date   Anxiety    Back pain    Bilateral swelling of feet    Breast calcifications on mammogram    bilateral; watching, will have follow-up every 6 months for 2 yrs   Chest pain    Constipation    Diverticulitis    Dizziness    Fibromyalgia    Gallbladder problem    GERD (gastroesophageal reflux disease)    H/O fracture 2020   left foot, right ankle   Headache    Heartburn    High blood pressure    Indigestion    Joint pain    Multiple food allergies    Palpitations    Vitamin B12 deficiency    Vitamin D  deficiency    Past Surgical History:  Procedure Laterality Date   GALLBLADDER SURGERY  1996   right  ankle fracture surgery  2020   2 plates/10 screws    right ankle tendon repair  2010   TONSILLECTOMY  1996   TOTAL VAGINAL HYSTERECTOMY  2009   Social History   Social History Narrative   Lives at home with spouse & daughter   Right handed   Caffeine: 2 glasses of sweet tea/day   family history includes Allergic rhinitis in her mother, paternal aunt, and son; Cancer in her mother; Diabetes in her maternal grandmother and mother; Food Allergy  in her son; Heart Problems in her mother; Heart attack in her mother; Heart disease in her mother; Hyperlipidemia in her mother; Hypertension in her mother; Sinusitis in her mother, paternal aunt, and son; Sudden death in her father and mother; Urticaria in her mother.   Review of Systems  As per HPI Objective:   Physical Exam @BP  124/78   Pulse 74   Ht 5' 7 (1.702 m)   Wt 239 lb (108.4 kg)   BMI 37.43 kg/m @  General:  NAD Eyes:   anicteric  Lungs:  clear, anterior chest wall is mildly tender Heart::  S1S2 no rubs, murmurs or gallops Abdomen:  soft and nontender, BS+ Ext:   no edema, cyanosis or clubbing    Data reviewed see HPI

## 2024-01-10 ENCOUNTER — Encounter (HOSPITAL_COMMUNITY): Payer: Self-pay

## 2024-01-10 ENCOUNTER — Encounter (HOSPITAL_BASED_OUTPATIENT_CLINIC_OR_DEPARTMENT_OTHER): Admitting: Cardiology

## 2024-01-10 DIAGNOSIS — G4733 Obstructive sleep apnea (adult) (pediatric): Secondary | ICD-10-CM | POA: Diagnosis not present

## 2024-01-12 ENCOUNTER — Encounter: Payer: Self-pay | Admitting: Cardiology

## 2024-01-12 ENCOUNTER — Other Ambulatory Visit: Payer: Self-pay | Admitting: Cardiology

## 2024-01-12 ENCOUNTER — Ambulatory Visit (HOSPITAL_COMMUNITY)
Admission: RE | Admit: 2024-01-12 | Discharge: 2024-01-12 | Disposition: A | Discharge status: Home / Self Care | Source: Ambulatory Visit | Attending: Cardiology | Admitting: Cardiology

## 2024-01-12 DIAGNOSIS — I493 Ventricular premature depolarization: Secondary | ICD-10-CM

## 2024-01-12 MED ORDER — GADOBUTROL 1 MMOL/ML IV SOLN
10.0000 mL | Freq: Once | INTRAVENOUS | Status: AC | PRN
  Administered 2024-01-12: 10 mL via INTRAVENOUS

## 2024-01-13 ENCOUNTER — Ambulatory Visit: Payer: Self-pay | Admitting: Cardiology

## 2024-01-13 ENCOUNTER — Encounter: Payer: Self-pay | Admitting: Obstetrics and Gynecology

## 2024-01-13 ENCOUNTER — Ambulatory Visit: Admitting: Obstetrics and Gynecology

## 2024-01-13 VITALS — BP 118/62 | HR 73 | Temp 97.8°F | Ht 67.75 in | Wt 237.0 lb

## 2024-01-13 DIAGNOSIS — B372 Candidiasis of skin and nail: Secondary | ICD-10-CM | POA: Diagnosis not present

## 2024-01-13 DIAGNOSIS — Z79890 Hormone replacement therapy (postmenopausal): Secondary | ICD-10-CM | POA: Diagnosis not present

## 2024-01-13 DIAGNOSIS — N6452 Nipple discharge: Secondary | ICD-10-CM | POA: Diagnosis not present

## 2024-01-13 MED ORDER — CLOTRIMAZOLE-BETAMETHASONE 1-0.05 % EX CREA: 1.0000 | TOPICAL_CREAM | Freq: Two times a day (BID) | CUTANEOUS | 0 refills | Status: AC

## 2024-01-13 NOTE — Progress Notes (Addendum)
 53 y.o. [id number] female s/p TVH, BSO on ERT here for problem visit. Married.  No LMP recorded. Patient has had a hysterectomy.   She reports Recurrent yeast infection under fold of abdomen.  Symptoms recur monthly.  She will apply Lotrisone  cream.  She has avoided using daily nystatin  powder as she did not want to cause the recurrent yeast infection.  She notes that she started HRT last year for concerns regarding dry skin, thinning hair, weight gain.  She has not seen an improvement in the symptoms since starting her HRT.  She however has experienced increasing PVCs now managed by cardiology and intermittent hot flashes.  She stopped her patch 3 weeks ago and feels the side effects have resolved.  She is wondering if she needs to be on any hormone replacement at this time.  She is also noted rare (3 times over the past year) clear, left nipple discharge.  No breast pain or masses. She is being followed with mammograms every 6 months for BI-RADS 3 imaging.  OB History  Gravida Para Term Preterm AB Living  3 2 2  1 2   SAB IAB Ectopic Multiple Live Births      2    # Outcome Date GA Lbr Len/2nd Weight Sex Type Anes PTL Lv  3 AB           2 Term      Vag-Spont   LIV  1 Term      Vag-Spont   LIV   Past Medical History:  Diagnosis Date   Anxiety    Back pain    Bilateral swelling of feet    Breast calcifications on mammogram    bilateral; watching, will have follow-up every 6 months for 2 yrs   Chest pain    Constipation    Diverticulitis    Dizziness    Fibromyalgia    GERD (gastroesophageal reflux disease)    H/O fracture 2020   left foot, right ankle   Headache    Heartburn    High blood pressure    Indigestion    Joint pain    Palpitations    Shellfish allergy     Vitamin B12 deficiency    Vitamin D  deficiency    Past Surgical History:  Procedure Laterality Date   GALLBLADDER SURGERY  1996   right ankle fracture surgery  2020   2 plates/10 screws    right ankle  tendon repair  2010   TONSILLECTOMY  1996   TOTAL VAGINAL HYSTERECTOMY  2009   Current Outpatient Medications on File Prior to Visit  Medication Sig Dispense Refill   doxycycline (ADOXA) 50 MG tablet Take 50 mg by mouth 2 (two) times daily.     EPINEPHrine  0.3 mg/0.3 mL IJ SOAJ injection      ibuprofen (ADVIL) 800 MG tablet Take 800 mg by mouth 3 (three) times daily as needed.     metoprolol  succinate (TOPROL  XL) 25 MG 24 hr tablet Take 1 tablet (25 mg total) by mouth daily.     Multiple Vitamin (MULTIVITAMIN) tablet Take 1 tablet by mouth daily.     nystatin  (MYCOSTATIN /NYSTOP ) powder Apply 1 Application topically 2 (two) times daily. 30 g 1   VITAMIN D  PO Take by mouth daily.     metoprolol  tartrate (LOPRESSOR ) 25 MG tablet Take 0.5 tablets (12.5 mg total) by mouth as needed. Daily as needed (Patient not taking: Reported on 01/13/2024) 90 tablet 3   No current facility-administered medications on  file prior to visit.   Allergies  Allergen Reactions   Shellfish Allergy  Other (See Comments)    Allergy  tested at doctor's office   Cephalosporins Rash   Epinephrine  Other (See Comments)    really bad racing heart and everything that's not supposed to happen happens, occurs even with epinephrine  in a numbing agent. Patient states years ago her dentist told her to put Epinephrine  on her allergy  list.   Escitalopram Oxalate Other (See Comments)    Did not like the way it made her feel.   No Healthtouch Food Allergies Rash    Mustard, Pork, and Chocolate: Migraines, rash, N/V   Sudafed [Pseudoephedrine] Other (See Comments)    Heart races      PE Today's Vitals   01/13/24 1054  BP: 118/62  Pulse: 73  Temp: 97.8 F (36.6 C)  TempSrc: Oral  SpO2: 98%  Weight: 237 lb (107.5 kg)  Height: 5' 7.75 (1.721 m)   Body mass index is 36.3 kg/m.  Physical Exam Vitals reviewed.  Constitutional:      General: She is not in acute distress.    Appearance: Normal appearance.  HENT:      Head: Normocephalic and atraumatic.     Nose: Nose normal.  Eyes:     Extraocular Movements: Extraocular movements intact.     Conjunctiva/sclera: Conjunctivae normal.  Pulmonary:     Effort: Pulmonary effort is normal.  Abdominal:     Comments: Mild erythema along pannus along incision  Musculoskeletal:        General: Normal range of motion.     Cervical back: Normal range of motion.  Neurological:     General: No focal deficit present.     Mental Status: She is alert.  Psychiatric:        Mood and Affect: Mood normal.        Behavior: Behavior normal.      Assessment and Plan:        Skin yeast infection -     Clotrimazole -Betamethasone ; Apply 1 Application topically 2 (two) times daily for 14 days.  Dispense: 45 g; Refill: 0 Recommend daily nystatin  powder use  Hormone replacement therapy  Okay to discontinue estrogen testosterone on replacement therapy. Discussed importance of healthy diet, regular exercise, good sleep hygiene, multivitamins.  Nipple discharge - Prolactin    Vera LULLA Pa, MD

## 2024-01-13 NOTE — Addendum Note (Signed)
 Addended by: DALLIE BOLLARD V on: 01/13/2024 04:47 PM   Modules accepted: Orders

## 2024-01-16 NOTE — Progress Notes (Unsigned)
 Cardiology Office Note:    Date:  01/16/2024   ID:  Delon Blumenthal, DOB [birth year], MRN [id number]  PCP:  Macarthur Elouise SQUIBB, DO  Cardiologist:  Lonni LITTIE Nanas, MD  Electrophysiologist:  None   Referring MD: Lazoff, Shawn P, DO   No chief complaint on file.   History of Present Illness:    Veronica Hunter is a 53 y.o. female with a hx of fibromyalgia, GERD, reported history of MVP who presents for follow-up.  She was seen by Dr. Alvan for preop evaluation 10/06/2022.  She reported chest pain and palpitations.  Had been seen in the ED on 08/06/2022, workup unremarkable, including negative troponins.  She had a calcium  score of 0 in 2022.  Exercise Myoview was ordered but was denied by insurance.  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  Echocardiogram 01/01/2023 showed EF 60 to 65%, grade 1 diastolic dysfunction, normal RV function, mild mitral valve prolapse with trivial mitral regurgitation.  Zio patch x 7 days 08/2023 showed 3 episodes of SVT with longest lasting 15 seconds, symptoms corresponded to PVCs.  ETT 12/30/2023 showed no no ST deviation but marked increase in PVCs and ventricular couplets during exercise.  Cardiac MRI 01/12/2024 showed normal biventricular size and systolic function, no LGE.  Since last clinic visit,  she reports she is doing okay.  She continues to have thumping feeling in chest after exercise.  Also chest pain with this.  Has improved some with metoprolol  but continues to have issues with this.  Reports BP 110s to 120s when she checks at home.  BP Readings from Last 3 Encounters:  01/13/24 118/62  01/05/24 124/78  12/21/23 125/68     Past Medical History:  Diagnosis Date   Anxiety    Back pain    Bilateral swelling of feet    Breast calcifications on mammogram    bilateral; watching, will have follow-up every 6 months for 2 yrs   Chest pain     Constipation    Diverticulitis    Dizziness    Fibromyalgia    GERD (gastroesophageal reflux disease)    H/O fracture 2020   left foot, right ankle   Headache    Heartburn    High blood pressure    Indigestion    Joint pain    Palpitations    Shellfish allergy     Vitamin B12 deficiency    Vitamin D  deficiency     Past Surgical History:  Procedure Laterality Date   GALLBLADDER SURGERY  1996   right ankle fracture surgery  2020   2 plates/10 screws    right ankle tendon repair  2010   TONSILLECTOMY  1996   TOTAL VAGINAL HYSTERECTOMY  2009    Current Medications: No outpatient medications have been marked as taking for the 01/18/24 encounter (Appointment) with Nanas Lonni LITTIE, MD.     Allergies:   Shellfish allergy , Cephalosporins, Epinephrine , Escitalopram oxalate, No healthtouch food allergies, and Sudafed [pseudoephedrine]   Social History   Socioeconomic History   Marital status: Married    Spouse name: Not on file   Number of children: 2   Years of education: Not on file   Highest education level: Bachelor's degree (e.g., BA, AB, BS)  Occupational History   Occupation: Part time music therapist  Tobacco Use   Smoking status: Never    Passive exposure: Never   Smokeless tobacco: Never  Vaping Use  Vaping status: Never Used  Substance and Sexual Activity   Alcohol use: Never   Drug use: Never   Sexual activity: Yes    Partners: Male    Birth control/protection: Surgical    Comment: hysterectomy, menarche 53yo, sexual debut 53yo  Other Topics Concern   Not on file  Social History Narrative   Lives at home with spouse & daughter   Right handed   Caffeine: 2 glasses of sweet tea/day   Social Drivers of Corporate Investment Banker Strain: Not on file  Food Insecurity: Low Risk  (10/26/2023)   Received from [facility]   Hunger Vital Sign    Within the past 12 months, you worried that your food would run out before you got money to buy more:  Never true    Within the past 12 months, the food you bought just didn't last and you didn't have money to get more. : Never true  Transportation Needs: No Transportation Needs (10/26/2023)   Received from [facility]    In the past 12 months, has lack of reliable transportation kept you from medical appointments, meetings, work or from getting things needed for daily living? : No  Physical Activity: Not on file  Stress: Not on file  Social Connections: Not on file     Family History: The patient's family history includes Allergic rhinitis in her mother, paternal aunt, and son; Cancer in her mother; Diabetes in her maternal grandmother and mother; Food Allergy  in her son; Heart Problems in her mother; Heart attack in her mother; Heart disease in her mother; Hyperlipidemia in her mother; Hypertension in her mother; Sinusitis in her mother, paternal aunt, and son; Sudden death in her father and mother; Urticaria in her mother. There is no history of Migraines, Colon cancer, Esophageal cancer, Stomach cancer, Asthma, Immunodeficiency, Atopy, Angioedema, Eczema, or Rectal cancer.  ROS:   Please see the history of present illness.     All other systems reviewed and are negative.  EKGs/Labs/Other Studies Reviewed:    The following studies were reviewed today:   EKG:   08/06/2022: Normal sinus rhythm, rate 76, Q waves in inferior leads and V1-4 12/10/23: NSR, PVCs, LVH, Q waves in inferior leads and V1-4  Recent Labs: 10/27/2023: ALT 24; BUN 16; Creatinine, Ser 0.78; Hemoglobin 12.9; Magnesium 2.3; Platelets 175; Potassium 4.0; Sodium 137  Recent Lipid Panel    Component Value Date/Time   CHOL 158 12/11/2022 1033   TRIG 90 12/11/2022 1033   HDL 41 12/11/2022 1033   CHOLHDL 3.9 12/11/2022 1033   LDLCALC 100 (H) 12/11/2022 1033    Physical Exam:    VS:  There were no vitals taken for this visit.    Wt Readings from Last 3 Encounters:  01/13/24 237 lb (107.5 kg)   01/05/24 239 lb (108.4 kg)  12/21/23 243 lb 4.8 oz (110.4 kg)     GEN:  Well nourished, well developed in no acute distress HEENT: Normal NECK: No JVD; No carotid bruits LYMPHATICS: No lymphadenopathy CARDIAC: RRR, no murmurs, rubs, gallops RESPIRATORY:  Clear to auscultation without rales, wheezing or rhonchi  ABDOMEN: Soft, non-tender, non-distended MUSCULOSKELETAL:  No edema; No deformity  SKIN: Warm and dry NEUROLOGIC:  Alert and oriented x 3 PSYCHIATRIC:  Normal affect   ASSESSMENT:    No diagnosis found.   PLAN:    Palpitations/PVCs: Zio patch x 7 days 08/2023 showed 3 episodes of SVT with longest lasting 15 seconds.  Symptoms  corresponded to PVCs.  ED visit 10/2023 with palpitations, appeared to correspond to PVCs  ETT 12/30/2023 showed no ST deviation but marked increase in PVCs and ventricular couplets during exercise.  Cardiac MRI 01/12/2024 showed normal biventricular size and systolic function, no LGE. - Suspect PVCs as cause of her symptoms, she reports Strickling notices it during exercise and significant increase in PVC burden with exercise noted on ETT.  Cardiac MRI unremarkable.  Referred to EP for evaluation  CAD: Reported exertional chest pain.  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 - Had recommended starting atorvastatin  but she did not start.  Recommend starting atorvastatin  20 mg daily***   Mitral valve prolapse: Echocardiogram 01/01/2023 showed EF 60 to 65%, grade 1 diastolic dysfunction, normal RV function, mild mitral valve prolapse with trivial mitral regurgitation.   Hypertension: Continue Toprol -XL  Hyperlipidemia: LDL 100 on 12/11/22.  Coronary CTA results as above.  Start atorvastatin  20 mg daily and check fasting lipid panel in 3 months***  Snoring/daytime somnolence: Check Itamar sleep study.  STOP-BANG 4.    RTC in 4 months    Medication  Adjustments/Labs and Tests Ordered: Current medicines are reviewed at length with the patient today.  Concerns regarding medicines are outlined above.  No orders of the defined types were placed in this encounter.  No orders of the defined types were placed in this encounter.   There are no Patient Instructions on file for this visit.   Signed, Lonni LITTIE Nanas, MD  01/16/2024 9:06 PM    [facility]

## 2024-01-17 NOTE — Progress Notes (Signed)
 Urine culture is negative. Vaginal swab is negative for BV and yeast. How are your current symptoms doing after fluconazole and Augmentin?

## 2024-01-18 ENCOUNTER — Ambulatory Visit: Attending: Cardiology | Admitting: Cardiology

## 2024-01-18 ENCOUNTER — Encounter: Payer: Self-pay | Admitting: Obstetrics and Gynecology

## 2024-01-18 ENCOUNTER — Ambulatory Visit (INDEPENDENT_AMBULATORY_CARE_PROVIDER_SITE_OTHER): Admitting: Obstetrics and Gynecology

## 2024-01-18 ENCOUNTER — Ambulatory Visit: Payer: Self-pay | Admitting: Obstetrics and Gynecology

## 2024-01-18 ENCOUNTER — Ambulatory Visit: Attending: Cardiology

## 2024-01-18 VITALS — BP 132/80 | HR 71 | Ht 67.75 in | Wt 240.0 lb

## 2024-01-18 VITALS — BP 132/82 | HR 70 | Temp 97.9°F | Ht 67.0 in | Wt 239.0 lb

## 2024-01-18 DIAGNOSIS — I493 Ventricular premature depolarization: Secondary | ICD-10-CM | POA: Diagnosis not present

## 2024-01-18 DIAGNOSIS — B9689 Other specified bacterial agents as the cause of diseases classified elsewhere: Secondary | ICD-10-CM

## 2024-01-18 DIAGNOSIS — N760 Acute vaginitis: Secondary | ICD-10-CM | POA: Diagnosis not present

## 2024-01-18 DIAGNOSIS — E785 Hyperlipidemia, unspecified: Secondary | ICD-10-CM

## 2024-01-18 DIAGNOSIS — N958 Other specified menopausal and perimenopausal disorders: Secondary | ICD-10-CM

## 2024-01-18 DIAGNOSIS — I341 Nonrheumatic mitral (valve) prolapse: Secondary | ICD-10-CM | POA: Diagnosis not present

## 2024-01-18 DIAGNOSIS — N898 Other specified noninflammatory disorders of vagina: Secondary | ICD-10-CM | POA: Insufficient documentation

## 2024-01-18 DIAGNOSIS — R42 Dizziness and giddiness: Secondary | ICD-10-CM | POA: Diagnosis not present

## 2024-01-18 DIAGNOSIS — I10 Essential (primary) hypertension: Secondary | ICD-10-CM

## 2024-01-18 DIAGNOSIS — I2510 Atherosclerotic heart disease of native coronary artery without angina pectoris: Secondary | ICD-10-CM | POA: Diagnosis not present

## 2024-01-18 DIAGNOSIS — R0683 Snoring: Secondary | ICD-10-CM

## 2024-01-18 DIAGNOSIS — R400 Somnolence: Secondary | ICD-10-CM

## 2024-01-18 LAB — URINALYSIS, COMPLETE W/RFL CULTURE
Bacteria, UA: NONE SEEN /HPF
Bilirubin Urine: NEGATIVE
Glucose, UA: NEGATIVE
Hyaline Cast: NONE SEEN /LPF
Ketones, ur: NEGATIVE
Leukocyte Esterase: NEGATIVE
Nitrites, Initial: NEGATIVE
Protein, ur: NEGATIVE
RBC / HPF: NONE SEEN /HPF (ref 0–2)
Specific Gravity, Urine: 1.025 (ref 1.001–1.035)
WBC, UA: NONE SEEN /HPF (ref 0–5)
pH: 5.5 (ref 5.0–8.0)

## 2024-01-18 LAB — WET PREP FOR TRICH, YEAST, CLUE

## 2024-01-18 LAB — NO CULTURE INDICATED

## 2024-01-18 MED ORDER — METRONIDAZOLE 0.75 % VA GEL: 1.0000 | Freq: Every day | VAGINAL | 0 refills | Status: AC

## 2024-01-18 MED ORDER — ESTRADIOL 0.01 % VA CREA: TOPICAL_CREAM | VAGINAL | 1 refills | Status: AC

## 2024-01-18 NOTE — Addendum Note (Signed)
 Addended by: Temple Sporer on: 01/18/2024 01:31 PM   Modules accepted: Orders

## 2024-01-18 NOTE — Procedures (Signed)
   SLEEP STUDY REPORT Patient Information Study Date: 01/10/2024 Patient Name: Veronica Hunter Patient ID: [id number] Birth Date: [birth year] Age: 53 Gender: Female BMI: 38.1 (W=242 lb, H=5' 7'') Referring Physician: Lonni Nanas, MD  TEST DESCRIPTION: Home sleep apnea testing was completed using the WatchPat, a Type 1 device, utilizing peripheral arterial tonometry (PAT), chest movement, actigraphy, pulse oximetry, pulse rate, body position and snore. AHI was calculated with apnea and hypopnea using valid sleep time as the denominator. RDI includes apneas, hypopneas, and RERAs. The data acquired and the scoring of sleep and all associated events were performed in accordance with the recommended standards and specifications as outlined in the AASM Manual for the Scoring of Sleep and Associated Events 2.2.0 (2015).  FINDINGS:  1. Severe Obstructive Sleep Apnea with AHI 47.1/hr.  2. No Central Sleep Apnea with pAHIc 1.8/hr.  3. Oxygen desaturations as low as 77%.  4. Mild snoring was present. O2 sats were < 88% for 21.8 min.  5. Total sleep time was 6 hrs and 8 min.  6. 34.2% of total sleep time was spent in REM sleep.  7. Normal sleep onset latency at 13 min.  8. Shortened REM sleep onset latency at 40 min.  9. Total awakenings were 4. 10. Arrhythmia detection: None  DIAGNOSIS: Severe Obstructive Sleep Apnea (G47.33) Nocturnal Hypoxemia  RECOMMENDATIONS: 1. Clinical correlation of these findings is necessary. The decision to treat obstructive sleep apnea (OSA) is usually based on the presence of apnea symptoms or the presence of associated medical conditions such as Hypertension, Congestive Heart Failure, Atrial Fibrillation or Obesity. The most common symptoms of OSA are snoring, gasping for breath while sleeping, daytime sleepiness and fatigue. 2. Initiating apnea therapy is recommended given the presence of symptoms and/or associated conditions. Recommend proceeding  with one of the following:  a. Auto-CPAP therapy with a pressure range of 5-20cm H2O.  b. An oral appliance (OA) that can be obtained from certain dentists with expertise in sleep medicine. These are primarily of use in non-obese patients with mild and moderate disease.  c. An ENT consultation which may be useful to look for specific causes of obstruction and possible treatment options.  d. If patient is intolerant to PAP therapy, consider referral to ENT for evaluation for hypoglossal nerve stimulator. 3. Close follow-up is necessary to ensure success with CPAP or oral appliance therapy for maximum benefit . 4. A follow-up oximetry study on CPAP is recommended to assess the adequacy of therapy and determine the need for supplemental oxygen or the potential need for Bi-level therapy. An arterial blood gas to determine the adequacy of baseline ventilation and oxygenation should also be considered. 5. Healthy sleep recommendations include: adequate nightly sleep (normal 7-9 hrs/night), avoidance of caffeine after noon and alcohol near bedtime, and maintaining a sleep environment that is cool, dark and quiet. 6. Weight loss for overweight patients is recommended. Even modest amounts of weight loss can significantly improve the severity of sleep apnea. 7. Snoring recommendations include: weight loss where appropriate, side sleeping, and avoidance of alcohol before bed. 8. Operation of motor vehicle should be avoided when sleepy.  Signature: Wilbert Bihari, MD; [facility]; Diplomat, American Board of Sleep Medicine Electronically Signed: 01/18/2024 5:43:26 PM

## 2024-01-18 NOTE — Progress Notes (Signed)
 53 y.o. [id number] female s/p TVH, BSO on ERT (recently discontinued) here for problem visit. Married.  No LMP recorded. Patient has had a hysterectomy.   She notes that she started HRT last year for concerns regarding dry skin, thinning hair, weight gain.  She has not seen an improvement in the symptoms since starting her HRT.  She however has experienced increasing PVCs now managed by cardiology and intermittent hot flashes.  She stopped her patch 3 weeks ago and feels the side effects have resolved.  She is wondering if she needs to be on any hormone replacement at this time.  She is also noted rare (3 times over the past year) clear, left nipple discharge.  No breast pain or masses. She is being followed with mammograms every 6 months for BI-RADS 3 imaging.  She has intermittent vaginal irritation over the past year. However over the weekend her symptoms were severe itching and redness. Noticed some bleeding Sunday night. Sx seem to be milder today but still present. Seen by PCP 01/14/24 with negative UA and vaginal swab.   OB History  Gravida Para Term Preterm AB Living  3 2 2  1 2   SAB IAB Ectopic Multiple Live Births      2    # Outcome Date GA Lbr Len/2nd Weight Sex Type Anes PTL Lv  3 AB           2 Term      Vag-Spont   LIV  1 Term      Vag-Spont   LIV   Past Medical History:  Diagnosis Date   Anxiety    Back pain    Bilateral swelling of feet    Breast calcifications on mammogram    bilateral; watching, will have follow-up every 6 months for 2 yrs   Chest pain    Constipation    Diverticulitis    Dizziness    Fibromyalgia    GERD (gastroesophageal reflux disease)    H/O fracture 2020   left foot, right ankle   Headache    Heartburn    High blood pressure    Indigestion    Joint pain    Palpitations    Shellfish allergy     Vitamin B12 deficiency    Vitamin D  deficiency    Past Surgical History:  Procedure Laterality Date   GALLBLADDER SURGERY  1996    right ankle fracture surgery  2020   2 plates/10 screws    right ankle tendon repair  2010   TONSILLECTOMY  1996   TOTAL VAGINAL HYSTERECTOMY  2009   Current Outpatient Medications on File Prior to Visit  Medication Sig Dispense Refill   clotrimazole -betamethasone  (LOTRISONE ) cream Apply 1 Application topically 2 (two) times daily for 14 days. 45 g 0   EPINEPHrine  0.3 mg/0.3 mL IJ SOAJ injection      ibuprofen (ADVIL) 800 MG tablet Take 800 mg by mouth 3 (three) times daily as needed.     metoprolol  succinate (TOPROL  XL) 25 MG 24 hr tablet Take 1 tablet (25 mg total) by mouth daily.     metoprolol  tartrate (LOPRESSOR ) 25 MG tablet Take 0.5 tablets (12.5 mg total) by mouth as needed. Daily as needed 90 tablet 3   Multiple Vitamin (MULTIVITAMIN) tablet Take 1 tablet by mouth daily.     nystatin  (MYCOSTATIN /NYSTOP ) powder Apply 1 Application topically 2 (two) times daily. 30 g 1   VITAMIN D  PO Take by mouth daily.     No  current facility-administered medications on file prior to visit.   Allergies  Allergen Reactions   Shellfish Allergy  Other (See Comments)    Allergy  tested at doctor's office   Cephalosporins Rash   Epinephrine  Other (See Comments)    really bad racing heart and everything that's not supposed to happen happens, occurs even with epinephrine  in a numbing agent. Patient states years ago her dentist told her to put Epinephrine  on her allergy  list.   Escitalopram Oxalate Other (See Comments)    Did not like the way it made her feel.   No [employer]alt[employer] Food Allergies Rash    Mustard, Pork, and Chocolate: Migraines, rash, N/V   Sudafed [Pseudoephedrine] Other (See Comments)    Heart races      PE Today's Vitals   01/18/24 1200  BP: 132/82  Pulse: 70  Temp: 97.9 F (36.6 C)  TempSrc: Oral  SpO2: 97%  Weight: 239 lb (108.4 kg)  Height: 5' 7 (1.702 m)   Body mass index is 37.43 kg/m.  Physical Exam Vitals reviewed. Exam conducted with a chaperone present.   Constitutional:      General: She is not in acute distress.    Appearance: Normal appearance.  HENT:     Head: Normocephalic and atraumatic.     Nose: Nose normal.  Eyes:     Extraocular Movements: Extraocular movements intact.     Conjunctiva/sclera: Conjunctivae normal.  Pulmonary:     Effort: Pulmonary effort is normal.  Genitourinary:    General: Normal vulva.     Exam position: Lithotomy position.     Vagina: Vaginal discharge present.     Uterus: Absent.      Adnexa: Right adnexa normal and left adnexa normal.   Musculoskeletal:        General: Normal range of motion.     Cervical back: Normal range of motion.  Neurological:     General: No focal deficit present.     Mental Status: She is alert.  Psychiatric:        Mood and Affect: Mood normal.        Behavior: Behavior normal.      Assessment and Plan:        Itching of vagina -     Urinalysis,Complete w/RFL Culture -     WET PREP FOR TRICH, YEAST, CLUE -     Urinalysis,Complete w/RFL Culture  Vaginal discharge -     WET PREP FOR TRICH, YEAST, CLUE  BV (bacterial vaginosis) -     metroNIDAZOLE; Place 1 Applicatorful vaginally at bedtime for 5 days.  Dispense: 50 g; Refill: 0  Genitourinary syndrome of menopause -     Estradiol ; Apply 0.5g to vulva nightly for 2 weeks then 2 times a week.  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  Recommend starting Will treat for BV   Vera LULLA Pa, MD

## 2024-01-18 NOTE — Patient Instructions (Signed)
 Medication Instructions:  none *If you need a refill on your cardiac medications before your next appointment, please call your pharmacy*  Lab Work: none If you have labs (blood work) drawn today and your tests are completely normal, you will receive your results only by: MyChart Message (if you have MyChart) OR A paper copy in the mail If you have any lab test that is abnormal or we need to change your treatment, we will call you to review the results.  Testing/Procedures: none  Follow-Up: At [facility], you and your health needs are our priority.  As part of our continuing mission to provide you with exceptional heart care, our providers are all part of one team.  This team includes your primary Cardiologist (physician) and Advanced Practice Providers or APPs (Physician Assistants and Nurse Practitioners) who all work together to provide you with the care you need, when you need it.  Your next appointment:   6 month(s)  Provider:   Lonni LITTIE Nanas, MD    We recommend signing up for the patient portal called MyChart.  Sign up information is provided on this After Visit Summary.  MyChart is used to connect with patients for Virtual Visits (Telemedicine).  Patients are able to view lab/test results, encounter notes, upcoming appointments, etc.  Non-urgent messages can be sent to your provider as well.   To learn more about what you can do with MyChart, go to [email or web address].   Other Instructions None

## 2024-01-19 ENCOUNTER — Ambulatory Visit (HOSPITAL_COMMUNITY)

## 2024-01-26 ENCOUNTER — Telehealth: Payer: Self-pay | Admitting: Diagnostic Neuroimaging

## 2024-01-26 NOTE — Telephone Encounter (Signed)
 Inquiry about an earlier appointment

## 2024-01-28 ENCOUNTER — Ambulatory Visit: Attending: Cardiovascular Disease | Admitting: Cardiovascular Disease

## 2024-01-28 ENCOUNTER — Encounter: Payer: Self-pay | Admitting: Cardiovascular Disease

## 2024-01-28 VITALS — BP 134/78 | HR 67 | Ht 67.0 in | Wt 240.9 lb

## 2024-01-28 DIAGNOSIS — I493 Ventricular premature depolarization: Secondary | ICD-10-CM

## 2024-01-28 NOTE — Progress Notes (Signed)
 Electrophysiology Office Note:    Date:  01/28/2024   ID:  Cortlyn Cannell, DOB Aug 06, 1970, MRN [id number]  PCP:  Macarthur Elouise SQUIBB, DO   [facility] Providers Cardiologist:  Lonni LITTIE Nanas, MD     Referring MD: Nanas Lonni LITTIE*   History of Present Illness:    Veronica Hunter is a 53 y.o. female with a medical history significant for PVC, nonobstructive coronary disease mitral valve prolapse, referred for arrhythmia management.       Discussed the use of AI scribe software for clinical note transcription with the patient, who gave verbal consent to proceed.  History of Present Illness Veronica Hunter is a 52 year old female who presents with palpitations and thumping sensations attributed to PVCs.  She experiences a thumping sensation predominantly at night, which has increased in frequency over the past 18 months, now occurring randomly throughout the day. In June, she had a severe episode leading to an emergency room visit, where PVCs were identified. A heart monitor showed a PVC burden of less than one percent, with occurrences every fourth beat.  She was prescribed metoprolol , initially short-acting, then switched to a long-acting 25 mg dose, resulting in a 96% reduction in symptoms. Her blood pressure remains unchanged. She exercises with a trainer without significant issues but occasionally experiences the thumping sensation post-workout. She continues to monitor her symptoms and medication effects.         Today, she reports that she feels well and has no complaints.  EKGs/Labs/Other Studies Reviewed Today:     Echocardiogram:  TTE October 2024 LVEF 60 to 65%.  Grade 1 diastolic dysfunction.   Monitors:  7 day monitor June 2025-- my interpretation Sinus rhythm heart rate 55 to 122 bpm, average 81 Less than 1% PVC burden.  Symptoms correlate with PVCs in ventricular trigeminy  Stress testing:  ETT October 23 PVC burden increasing with  exercise.  Advanced imaging:  Cardiac MRI January 13, 2024 LVEF 60% normal structure and function   EKG:   EKG Interpretation Date/Time:  Friday January 28 2024 08:25:45 EST Ventricular Rate:  67 PR Interval:  166 QRS Duration:  98 QT Interval:  418 QTC Calculation: 441 R Axis:   -7  Text Interpretation: Normal sinus rhythm Left ventricular hypertrophy with repolarization abnormality ( R in aVL , Cornell product ) Inferior infarct (cited on or before 10-Dec-2023) Anteroseptal infarct (cited on or before 10-Dec-2023) When compared with ECG of 18-Jan-2024 09:47, No significant change was found Confirmed by Nancey Scotts [phone]) on 01/28/2024 8:52:46 AM     Physical Exam:    VS:  BP 134/78   Pulse 67   Ht 5' 7 (1.702 m)   Wt 240 lb 14.4 oz (109.3 kg)   SpO2 97%   BMI 37.73 kg/m     Wt Readings from Last 3 Encounters:  01/28/24 240 lb 14.4 oz (109.3 kg)  01/18/24 239 lb (108.4 kg)  01/18/24 240 lb (108.9 kg)     GEN: Well nourished, well developed in no acute distress CARDIAC: RRR RESPIRATORY:  Normal work of breathing MUSCULOSKELETAL: no edema    ASSESSMENT & PLAN:     PVCs Symptomatic with palpitations PVC burden is less than 1%, increasing with exercise She is not a good candidate for ablation because her burden is very low Palpitations have essentially improved with metoprolol  Will try magnesium taurate If she has recurrence of palpitations, may try propranolol    Signed, Manali Mcelmurry E Arnetha Silverthorne, MD  01/28/2024 8:53  AM    [facility]

## 2024-01-28 NOTE — Patient Instructions (Signed)
 Medication Instructions:  Your physician has recommended you make the following change in your medication:   ** Begin Magnesium Taurate 400mg  - 1 tablet daily - you may need to purchase this from [facility]  *If you need a refill on your cardiac medications before your next appointment, please call your pharmacy*  Lab Work: None ordered.  If you have labs (blood work) drawn today and your tests are completely normal, you will receive your results only by: MyChart Message (if you have MyChart) OR A paper copy in the mail If you have any lab test that is abnormal or we need to change your treatment, we will call you to review the results.  Testing/Procedures: None ordered.   Follow-Up: At [facility], you and your health needs are our priority.  As part of our continuing mission to provide you with exceptional heart care, our providers are all part of one team.  This team includes your primary Cardiologist (physician) and Advanced Practice Providers or APPs (Physician Assistants and Nurse Practitioners) who all work together to provide you with the care you need, when you need it.  Your next appointment:   12 months with EP APP

## 2024-01-31 ENCOUNTER — Other Ambulatory Visit: Payer: Self-pay | Admitting: Radiology

## 2024-01-31 DIAGNOSIS — N951 Menopausal and female climacteric states: Secondary | ICD-10-CM

## 2024-01-31 NOTE — Telephone Encounter (Signed)
 Med refill request: estradiol  (VIVELLE -DOT) 0.05 MG/24HR patch Start:  12/06/23 Disp:  24 Refills:  0    *Pt is requesting a 90 day supply*.  Last AEX:  10/28/22 Next AEX:  02/22/24 Last MMG (if hormonal med):  N/A Refill authorized? Please Advise.

## 2024-02-01 ENCOUNTER — Telehealth: Payer: Self-pay | Admitting: Cardiology

## 2024-02-01 ENCOUNTER — Telehealth: Payer: Self-pay | Admitting: *Deleted

## 2024-02-01 DIAGNOSIS — G4733 Obstructive sleep apnea (adult) (pediatric): Secondary | ICD-10-CM

## 2024-02-01 DIAGNOSIS — R0683 Snoring: Secondary | ICD-10-CM

## 2024-02-01 DIAGNOSIS — I2510 Atherosclerotic heart disease of native coronary artery without angina pectoris: Secondary | ICD-10-CM

## 2024-02-01 DIAGNOSIS — R400 Somnolence: Secondary | ICD-10-CM

## 2024-02-01 NOTE — Telephone Encounter (Signed)
 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 States that it looks like it is growing, denies any warmth or redness. Please advise.

## 2024-02-01 NOTE — Telephone Encounter (Signed)
 Pt c/o having varicose vein in R leg and has tolerated with compression socks. Started working out with trainer about 6 weeks ago and wears the socks during exercise, but recently noticed the vein has gotten thicker looking on my leg and has gotten longer. Pt states the vein has moved down into her calf and has this electrical shock feeling in it.  Pt denies any redness, discoloration, warmth. Leg feels more sensitive, heaviness and fatigue-feeling. Advised pt to continue wearing her compression socks and will make Dr. Kate aware for any further advisement. Pt verbalized understanding.

## 2024-02-01 NOTE — Telephone Encounter (Signed)
 Recommend compression socks and to keep the leg elevated

## 2024-02-01 NOTE — Telephone Encounter (Signed)
-----   Message from Wilbert Bihari sent at 01/18/2024  5:44 PM EST ----- Please let patient know that they have sleep apnea.  Recommend therapeutic CPAP titration for treatment of patient's sleep disordered breathing.

## 2024-02-01 NOTE — Telephone Encounter (Signed)
 The patient has been notified of the result and verbalized understanding.  All questions (if any) were answered. Joshua Dalton Seip, CMA 02/01/2024 2:07 PM     Precert titration

## 2024-02-01 NOTE — Telephone Encounter (Addendum)
**Note De-Identified Breionna Punt Obfuscation** The pt is requesting her sleep results.  I have already started a CPAP Titration through the [facility] Provider Portal and it is currently pending review. Tracking #: [id number]

## 2024-02-01 NOTE — Telephone Encounter (Signed)
 Patient following up on sleep study, states that it has been a long time and she should have heard something by now.

## 2024-02-02 NOTE — Telephone Encounter (Signed)
**Note De-Identified Yunuen Mordan Obfuscation** Per letter in the [facility] portal, this CPAP Titration has been approved from 03/13/2024-06/11/2024. Auth #: [id number]  I have transferred the order to the sleep lab.  I called the pt and made her aware of this approval and I gave her [facility]'s phone number so she can call them to be scheduled.  She verbalized understanding to all information given and thanked me for my call.

## 2024-02-06 NOTE — Progress Notes (Deleted)
 Cardiology Office Note:    Date:  02/06/2024   ID:  Veronica Hunter, DOB March 16, 1970, MRN [id number]  PCP:  Macarthur Elouise SQUIBB, DO  Cardiologist:  Lonni LITTIE Nanas, MD  Electrophysiologist:  None   Referring MD: Lazoff, Shawn P, DO   No chief complaint on file.   History of Present Illness:    Veronica Hunter is a 53 y.o. female with a hx of fibromyalgia, GERD, reported history of MVP who presents for follow-up.  She was seen by Dr. Alvan for preop evaluation 10/06/2022.  She reported chest pain and palpitations.  Had been seen in the ED on 08/06/2022, workup unremarkable, including negative troponins.  She had a calcium  score of 0 in 2022.  Exercise Myoview was ordered but was denied by insurance.  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  Echocardiogram 01/01/2023 showed EF 60 to 65%, grade 1 diastolic dysfunction, normal RV function, mild mitral valve prolapse with trivial mitral regurgitation.  Zio patch x 7 days 08/2023 showed 3 episodes of SVT with longest lasting 15 seconds, symptoms corresponded to PVCs.  ETT 12/30/2023 showed no no ST deviation but marked increase in PVCs and ventricular couplets during exercise.  Cardiac MRI 01/12/2024 showed normal biventricular size and systolic function, no LGE.  Since last clinic visit, she reports she is doing okay.  Reports continues to have palpitations but has had some improvement.  Reports if she pushes hard during exercise she feels PVCs.  Reports occasional lightheadedness but denies any syncope.  BP Readings from Last 3 Encounters:  01/28/24 134/78  01/18/24 132/82  01/18/24 132/80     Past Medical History:  Diagnosis Date   Anxiety    Back pain    Bilateral swelling of feet    Breast calcifications on mammogram    bilateral; watching, will have follow-up every 6 months for 2 yrs   Chest pain    Constipation    Diverticulitis     Dizziness    Fibromyalgia    GERD (gastroesophageal reflux disease)    H/O fracture 2020   left foot, right ankle   Headache    Heartburn    High blood pressure    Indigestion    Joint pain    Palpitations    Shellfish allergy     Vitamin B12 deficiency    Vitamin D  deficiency     Past Surgical History:  Procedure Laterality Date   GALLBLADDER SURGERY  1996   right ankle fracture surgery  2020   2 plates/10 screws    right ankle tendon repair  2010   TONSILLECTOMY  1996   TOTAL VAGINAL HYSTERECTOMY  2009    Current Medications: No outpatient medications have been marked as taking for the 02/08/24 encounter (Appointment) with Nanas Lonni LITTIE, MD.     Allergies:   Shellfish allergy , Cephalosporins, Epinephrine , Escitalopram oxalate, No healthtouch food allergies, and Sudafed [pseudoephedrine]   Social History   Socioeconomic History   Marital status: Married    Spouse name: Not on file   Number of children: 2   Years of education: Not on file   Highest education level: Bachelor's degree (e.g., BA, AB, BS)  Occupational History   Occupation: Part time music therapist  Tobacco Use   Smoking status: Never    Passive exposure: Never   Smokeless tobacco: Never  Vaping Use   Vaping status: Never Used  Substance and Sexual Activity  Alcohol use: Never   Drug use: Never   Sexual activity: Yes    Partners: Male    Birth control/protection: Surgical    Comment: hysterectomy, menarche 53yo, sexual debut 53yo  Other Topics Concern   Not on file  Social History Narrative   Lives at home with spouse & daughter   Right handed   Caffeine: 2 glasses of sweet tea/day   Social Drivers of Health   Financial Resource Strain: Low Risk  (01/26/2024)   Received from [facility]   Overall Financial Resource Strain (CARDIA)    How hard is it for you to pay for the very basics like food, housing, medical care, and heating?: Not hard at all  Food Insecurity: No Food  Insecurity (01/26/2024)   [facility]   Hunger Vital Sign    Within the past 12 months, you worried that your food would run out before you got the money to buy more.: Never true    Within the past 12 months, the food you bought just didn't last and you didn't have money to get more.: Never true  Transportation Needs: No Transportation Needs (01/26/2024)   Received from [facility] - Transportation    In the past 12 months, has lack of transportation kept you from medical appointments or from getting medications?: No    In the past 12 months, has lack of transportation kept you from meetings, work, or from getting things needed for daily living?: No  Physical Activity: Not on file  Stress: Not on file  Social Connections: Not on file     Family History: The patient's family history includes Allergic rhinitis in her mother, paternal aunt, and son; Cancer in her mother; Diabetes in her maternal grandmother and mother; Food Allergy  in her son; Heart Problems in her mother; Heart attack in her mother; Heart disease in her mother; Hyperlipidemia in her mother; Hypertension in her mother; Sinusitis in her mother, paternal aunt, and son; Sudden death in her father and mother; Urticaria in her mother. There is no history of Migraines, Colon cancer, Esophageal cancer, Stomach cancer, Asthma, Immunodeficiency, Atopy, Angioedema, Eczema, or Rectal cancer.  ROS:   Please see the history of present illness.     All other systems reviewed and are negative.  EKGs/Labs/Other Studies Reviewed:    The following studies were reviewed today:   EKG:   08/06/2022: Normal sinus rhythm, rate 76, Q waves in inferior leads and V1-4 12/10/23: NSR, PVCs, LVH, Q waves in inferior leads and V1-4 01/18/2024: Normal sinus rhythm, LVH, no PVCs  Recent Labs: 10/27/2023: ALT 24; BUN 16; Creatinine, Ser 0.78; Hemoglobin 12.9; Magnesium 2.3; Platelets 175; Potassium 4.0; Sodium 137  Recent  Lipid Panel    Component Value Date/Time   CHOL 158 12/11/2022 1033   TRIG 90 12/11/2022 1033   HDL 41 12/11/2022 1033   CHOLHDL 3.9 12/11/2022 1033   LDLCALC 100 (H) 12/11/2022 1033    Physical Exam:    VS:  There were no vitals taken for this visit.    Wt Readings from Last 3 Encounters:  01/28/24 240 lb 14.4 oz (109.3 kg)  01/18/24 239 lb (108.4 kg)  01/18/24 240 lb (108.9 kg)     GEN:  Well nourished, well developed in no acute distress HEENT: Normal NECK: No JVD; No carotid bruits LYMPHATICS: No lymphadenopathy CARDIAC: RRR, no murmurs, rubs, gallops RESPIRATORY:  Clear to auscultation without rales, wheezing or rhonchi  ABDOMEN: Soft, non-tender, non-distended  MUSCULOSKELETAL:  No edema; No deformity  SKIN: Warm and dry NEUROLOGIC:  Alert and oriented x 3 PSYCHIATRIC:  Normal affect   ASSESSMENT:    No diagnosis found.    PLAN:    Palpitations/PVCs: Zio patch x 7 days 08/2023 showed 3 episodes of SVT with longest lasting 15 seconds.  Symptoms corresponded to PVCs.  ED visit 10/2023 with palpitations, appeared to correspond to PVCs  ETT 12/30/2023 showed no ST deviation but marked increase in PVCs and ventricular couplets during exercise.  Cardiac MRI 01/12/2024 showed normal biventricular size and systolic function, no LGE. - Suspect PVCs as cause of her symptoms, she reports particularly notices it during exercise and there was significant increase in PVC burden with exercise noted on ETT.  Cardiac MRI unremarkable.  Referred to EP for evaluation, seen by Dr. Nancey recommended starting magnesium and if having recurrence of palpitations to try propranolol  CAD: Reported exertional chest pain.  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 - Recommend starting atorvastatin  20 mg daily but she declines   Mitral valve prolapse: Echocardiogram 01/01/2023 showed EF 60 to 65%, grade 1  diastolic dysfunction, normal RV function, mild mitral valve prolapse with trivial mitral regurgitation.   Hypertension: Continue Toprol -XL. Appears controlled.   Hyperlipidemia: LDL 100 on 12/11/22.  Coronary CTA results as above.  Recommend starting atorvastatin  but she declines  OSA: Sleep study 01/2024 showed severe OSA, starting on CPAP***  RTC in 6 months***    Medication Adjustments/Labs and Tests Ordered: Current medicines are reviewed at length with the patient today.  Concerns regarding medicines are outlined above.  No orders of the defined types were placed in this encounter.  No orders of the defined types were placed in this encounter.   There are no Patient Instructions on file for this visit.   Signed, Lonni LITTIE Nanas, MD  02/06/2024 1:47 PM    [facility]

## 2024-02-07 ENCOUNTER — Telehealth: Payer: Self-pay | Admitting: Cardiology

## 2024-02-07 NOTE — Telephone Encounter (Signed)
 Called and made patient aware that Dr. Kate recommended wearing compression socks and to keep the leg elevated.  Pt requested appt . Appt made 12/2. Pt verbalized an understanding.

## 2024-02-07 NOTE — Telephone Encounter (Signed)
 Pt states her sleep study was approved for starting date 03/09/24. She would like to know if this could be adjusted to current date as she has already met her deductible for the year. Please advise.

## 2024-02-08 ENCOUNTER — Ambulatory Visit: Admitting: Cardiology

## 2024-02-08 ENCOUNTER — Encounter: Payer: Self-pay | Admitting: Cardiology

## 2024-02-08 ENCOUNTER — Ambulatory Visit: Attending: Cardiology | Admitting: Cardiology

## 2024-02-08 VITALS — BP 117/75 | HR 66 | Ht 67.0 in | Wt 240.0 lb

## 2024-02-08 DIAGNOSIS — I83893 Varicose veins of bilateral lower extremities with other complications: Secondary | ICD-10-CM | POA: Diagnosis not present

## 2024-02-08 DIAGNOSIS — R002 Palpitations: Secondary | ICD-10-CM | POA: Diagnosis not present

## 2024-02-08 DIAGNOSIS — I2510 Atherosclerotic heart disease of native coronary artery without angina pectoris: Secondary | ICD-10-CM | POA: Diagnosis not present

## 2024-02-08 DIAGNOSIS — I10 Essential (primary) hypertension: Secondary | ICD-10-CM

## 2024-02-08 DIAGNOSIS — E669 Obesity, unspecified: Secondary | ICD-10-CM | POA: Diagnosis not present

## 2024-02-08 NOTE — Patient Instructions (Signed)
 Medication Instructions:  Your physician recommends that you continue on your current medications as directed. Please refer to the Current Medication list given to you today.  *If you need a refill on your cardiac medications before your next appointment, please call your pharmacy*  Lab Work: none If you have labs (blood work) drawn today and your tests are completely normal, you will receive your results only by: MyChart Message (if you have MyChart) OR A paper copy in the mail If you have any lab test that is abnormal or we need to change your treatment, we will call you to review the results.  Testing/Procedures: none  Follow-Up: At [facility], you and your health needs are our priority.  As part of our continuing mission to provide you with exceptional heart care, our providers are all part of one team.  This team includes your primary Cardiologist (physician) and Advanced Practice Providers or APPs (Physician Assistants and Nurse Practitioners) who all work together to provide you with the care you need, when you need it.  Your next appointment:   6 month  Provider:   Dr. Kate NOT delete brackets or number around this link :1}   We recommend signing up for the patient portal called MyChart.  Sign up information is provided on this After Visit Summary.  MyChart is used to connect with patients for Virtual Visits (Telemedicine).  Patients are able to view lab/test results, encounter notes, upcoming appointments, etc.  Non-urgent messages can be sent to your provider as well.   To learn more about what you can do with MyChart, go to [email or web address].   Other Instructions Referral to health  weight AND [facility] Referral to Vascular Vein Surgery

## 2024-02-08 NOTE — Telephone Encounter (Signed)
 11/26-READY, valid dates: 03/13/2024-06/11/2024. Auth #: [id number] LV  12/2 Ref# [id number] Marcey at [facility] once a it has been approved the auth can not be changed unless the approved shara is cancelled and start another one.[place]

## 2024-02-08 NOTE — Progress Notes (Unsigned)
 Cardiology Office Note:    Date:  02/08/2024   ID:  Veronica Hunter, DOB 08-Nov-1970, MRN [id number]  PCP:  Macarthur Elouise SQUIBB, DO  Cardiologist:  Lonni LITTIE Nanas, MD  Electrophysiologist:  None   Referring MD: Lazoff, Shawn P, DO   Chief Complaint  Patient presents with   Palpitations    History of Present Illness:    Veronica Hunter is a 53 y.o. female with a hx of fibromyalgia, GERD, reported history of MVP who presents for follow-up.  She was seen by Dr. Alvan for preop evaluation 10/06/2022.  She reported chest pain and palpitations.  Had been seen in the ED on 08/06/2022, workup unremarkable, including negative troponins.  She had a calcium  score of 0 in 2022.  Exercise Myoview was ordered but was denied by insurance.  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  Echocardiogram 01/01/2023 showed EF 60 to 65%, grade 1 diastolic dysfunction, normal RV function, mild mitral valve prolapse with trivial mitral regurgitation.  Zio patch x 7 days 08/2023 showed 3 episodes of SVT with longest lasting 15 seconds, symptoms corresponded to PVCs.  ETT 12/30/2023 showed no no ST deviation but marked increase in PVCs and ventricular couplets during exercise.  Cardiac MRI 01/12/2024 showed normal biventricular size and systolic function, no LGE.  Since last clinic visit, she reports she is doing okay.  Reports palpitations have improved, has only felt twice since last clinic visit.  She started on magnesium supplement.  Denies any chest pain.  Reports has varicose veins and has pain around where her varicose veins when she exercises.  BP Readings from Last 3 Encounters:  02/08/24 117/75  01/28/24 134/78  01/18/24 132/82     Past Medical History:  Diagnosis Date   Anxiety    Back pain    Bilateral swelling of feet    Breast calcifications on mammogram    bilateral; watching, will have follow-up every 6  months for 2 yrs   Chest pain    Constipation    Diverticulitis    Dizziness    Fibromyalgia    GERD (gastroesophageal reflux disease)    H/O fracture 2020   left foot, right ankle   Headache    Heartburn    High blood pressure    Indigestion    Joint pain    Palpitations    Shellfish allergy     Vitamin B12 deficiency    Vitamin D  deficiency     Past Surgical History:  Procedure Laterality Date   GALLBLADDER SURGERY  1996   right ankle fracture surgery  2020   2 plates/10 screws    right ankle tendon repair  2010   TONSILLECTOMY  1996   TOTAL VAGINAL HYSTERECTOMY  2009    Current Medications: Current Meds  Medication Sig   EPINEPHrine  0.3 mg/0.3 mL IJ SOAJ injection    estradiol  (ESTRACE ) 0.01 % CREA vaginal cream Apply 0.5g to vulva nightly for 2 weeks then 2 times a week.   ibuprofen (ADVIL) 800 MG tablet Take 800 mg by mouth 3 (three) times daily as needed.   metoprolol  succinate (TOPROL  XL) 25 MG 24 hr tablet Take 1 tablet (25 mg total) by mouth daily.   metoprolol  tartrate (LOPRESSOR ) 25 MG tablet Take 0.5 tablets (12.5 mg total) by mouth as needed. Daily as needed   Multiple Vitamin (MULTIVITAMIN) tablet Take 1 tablet by mouth daily.   nystatin  (MYCOSTATIN /NYSTOP ) powder  Apply 1 Application topically 2 (two) times daily.   VITAMIN D  PO Take by mouth daily.     Allergies:   Shellfish allergy , Cephalosporins, Epinephrine , Escitalopram oxalate, No healthtouch food allergies, and Sudafed [pseudoephedrine]   Social History   Socioeconomic History   Marital status: Married    Spouse name: Not on file   Number of children: 2   Years of education: Not on file   Highest education level: Bachelor's degree (e.g., BA, AB, BS)  Occupational History   Occupation: Part time music therapist  Tobacco Use   Smoking status: Never    Passive exposure: Never   Smokeless tobacco: Never  Vaping Use   Vaping status: Never Used  Substance and Sexual Activity   Alcohol use:  Never   Drug use: Never   Sexual activity: Yes    Partners: Male    Birth control/protection: Surgical    Comment: hysterectomy, menarche 53yo, sexual debut 53yo  Other Topics Concern   Not on file  Social History Narrative   Lives at home with spouse & daughter   Right handed   Caffeine: 2 glasses of sweet tea/day   Social Drivers of Health   Financial Resource Strain: Low Risk  (01/26/2024)   Received from [facility]   Overall Financial Resource Strain (CARDIA)    How hard is it for you to pay for the very basics like food, housing, medical care, and heating?: Not hard at all  Food Insecurity: No Food Insecurity (01/26/2024)   Received from [facility]   Hunger Vital Sign    Within the past 12 months, you worried that your food would run out before you got the money to buy more.: Never true    Within the past 12 months, the food you bought just didn't last and you didn't have money to get more.: Never true  Transportation Needs: No Transportation Needs (01/26/2024)   Received from [facility] - Transportation    In the past 12 months, has lack of transportation kept you from medical appointments or from getting medications?: No    In the past 12 months, has lack of transportation kept you from meetings, work, or from getting things needed for daily living?: No  Physical Activity: Not on file  Stress: Not on file  Social Connections: Not on file     Family History: The patient's family history includes Allergic rhinitis in her mother, paternal aunt, and son; Cancer in her mother; Diabetes in her maternal grandmother and mother; Food Allergy  in her son; Heart Problems in her mother; Heart attack in her mother; Heart disease in her mother; Hyperlipidemia in her mother; Hypertension in her mother; Sinusitis in her mother, paternal aunt, and son; Sudden death in her father and mother; Urticaria in her mother. There is no history of Migraines, Colon cancer, Esophageal  cancer, Stomach cancer, Asthma, Immunodeficiency, Atopy, Angioedema, Eczema, or Rectal cancer.  ROS:   Please see the history of present illness.     All other systems reviewed and are negative.  EKGs/Labs/Other Studies Reviewed:    The following studies were reviewed today:   EKG:   08/06/2022: Normal sinus rhythm, rate 76, Q waves in inferior leads and V1-4 12/10/23: NSR, PVCs, LVH, Q waves in inferior leads and V1-4 01/18/2024: Normal sinus rhythm, LVH, no PVCs  Recent Labs: 10/27/2023: ALT 24; BUN 16; Creatinine, Ser 0.78; Hemoglobin 12.9; Magnesium 2.3; Platelets 175; Potassium 4.0; Sodium 137  Recent Lipid Panel  Component Value Date/Time   CHOL 158 12/11/2022 1033   TRIG 90 12/11/2022 1033   HDL 41 12/11/2022 1033   CHOLHDL 3.9 12/11/2022 1033   LDLCALC 100 (H) 12/11/2022 1033    Physical Exam:    VS:  BP 117/75 (BP Location: Left Arm, Patient Position: Sitting)   Pulse 66   Ht 5' 7 (1.702 m)   Wt 240 lb (108.9 kg)   SpO2 98%   BMI 37.59 kg/m     Wt Readings from Last 3 Encounters:  02/08/24 240 lb (108.9 kg)  01/28/24 240 lb 14.4 oz (109.3 kg)  01/18/24 239 lb (108.4 kg)     GEN:  Well nourished, well developed in no acute distress HEENT: Normal NECK: No JVD; No carotid bruits LYMPHATICS: No lymphadenopathy CARDIAC: RRR, no murmurs, rubs, gallops RESPIRATORY:  Clear to auscultation without rales, wheezing or rhonchi  ABDOMEN: Soft, non-tender, non-distended MUSCULOSKELETAL:  No edema; No deformity  SKIN: Warm and dry NEUROLOGIC:  Alert and oriented x 3 PSYCHIATRIC:  Normal affect   ASSESSMENT:    1. Palpitations   2. Coronary artery disease involving native coronary artery of native heart without angina pectoris   3. Varicose veins of both lower extremities with complications   4. Obesity (BMI 30-39.9)   5. Essential hypertension       PLAN:    Palpitations/PVCs: Zio patch x 7 days 08/2023 showed 3 episodes of SVT with longest lasting 15  seconds.  Symptoms corresponded to PVCs.  ED visit 10/2023 with palpitations, appeared to correspond to PVCs  ETT 12/30/2023 showed no ST deviation but marked increase in PVCs and ventricular couplets during exercise.  Cardiac MRI 01/12/2024 showed normal biventricular size and systolic function, no LGE. - Suspect PVCs as cause of her symptoms, she reports particularly notices it during exercise and there was significant increase in PVC burden with exercise noted on ETT.  Cardiac MRI unremarkable.  Referred to EP for evaluation, seen by Dr. Nancey recommended starting magnesium and if having recurrence of palpitations to try propranolol.  She reports palpitations have improved  CAD: Reported exertional chest pain.  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 - Recommend starting atorvastatin  20 mg daily but she declines   Mitral valve prolapse: Echocardiogram 01/01/2023 showed EF 60 to 65%, grade 1 diastolic dysfunction, normal RV function, mild mitral valve prolapse with trivial mitral regurgitation.   Hypertension: Continue Toprol -XL. Appears controlled.   Hyperlipidemia: LDL 100 on 12/11/22.  Coronary CTA results as above.  Recommend starting atorvastatin  but she declines  OSA: Sleep study 01/2024 showed severe OSA, starting on CPAP  Varicose veins: She has been using compression stockings, despite this reports pain around her varicose veins when she exercises.  Will refer to VVS  Obesity: Body mass index is 37.59 kg/m.  Will refer to healthy weight and wellness  RTC in 6 months    Medication Adjustments/Labs and Tests Ordered: Current medicines are reviewed at length with the patient today.  Concerns regarding medicines are outlined above.  Orders Placed This Encounter  Procedures   Ambulatory referral to Vascular Surgery   Amb Ref to Medical Weight Management   No orders of the defined types  were placed in this encounter.   There are no Patient Instructions on file for this visit.   Signed, Lonni LITTIE Nanas, MD  02/08/2024 9:18 AM    [facility]

## 2024-02-09 ENCOUNTER — Ambulatory Visit (HOSPITAL_COMMUNITY)
Admission: RE | Admit: 2024-02-09 | Discharge: 2024-02-09 | Disposition: A | Discharge status: Home / Self Care | Source: Ambulatory Visit | Attending: Internal Medicine | Admitting: Internal Medicine

## 2024-02-09 DIAGNOSIS — K219 Gastro-esophageal reflux disease without esophagitis: Secondary | ICD-10-CM | POA: Diagnosis present

## 2024-02-09 DIAGNOSIS — R1310 Dysphagia, unspecified: Secondary | ICD-10-CM | POA: Diagnosis present

## 2024-02-09 NOTE — Telephone Encounter (Signed)
 12/2 READY-APPROVED-12/2 NEW AMENDED DATES PER PATIENT REQUEST READY- VALID DATES ARE 02/11/2024 TO 05/11/2024-AUTH #[id number]

## 2024-02-10 ENCOUNTER — Ambulatory Visit: Payer: Self-pay | Admitting: Internal Medicine

## 2024-02-16 ENCOUNTER — Ambulatory Visit (HOSPITAL_BASED_OUTPATIENT_CLINIC_OR_DEPARTMENT_OTHER): Attending: Cardiology | Admitting: Cardiology

## 2024-02-16 DIAGNOSIS — I2510 Atherosclerotic heart disease of native coronary artery without angina pectoris: Secondary | ICD-10-CM

## 2024-02-16 DIAGNOSIS — G4733 Obstructive sleep apnea (adult) (pediatric): Secondary | ICD-10-CM

## 2024-02-16 DIAGNOSIS — R0683 Snoring: Secondary | ICD-10-CM | POA: Diagnosis not present

## 2024-02-16 DIAGNOSIS — R400 Somnolence: Secondary | ICD-10-CM | POA: Diagnosis not present

## 2024-02-21 ENCOUNTER — Ambulatory Visit: Admitting: Internal Medicine

## 2024-02-21 ENCOUNTER — Encounter: Payer: Self-pay | Admitting: Internal Medicine

## 2024-02-21 VITALS — BP 122/72 | HR 63 | Ht 67.0 in | Wt 240.0 lb

## 2024-02-21 DIAGNOSIS — R1013 Epigastric pain: Secondary | ICD-10-CM

## 2024-02-21 DIAGNOSIS — K2100 Gastro-esophageal reflux disease with esophagitis, without bleeding: Secondary | ICD-10-CM

## 2024-02-21 DIAGNOSIS — Z91013 Allergy to seafood: Secondary | ICD-10-CM

## 2024-02-21 DIAGNOSIS — L509 Urticaria, unspecified: Secondary | ICD-10-CM

## 2024-02-21 DIAGNOSIS — R1310 Dysphagia, unspecified: Secondary | ICD-10-CM

## 2024-02-21 NOTE — Patient Instructions (Addendum)
 Go back on your omeprazole  per Dr Avram.   We will place a referral to [facility] located at [address], [place] [address], phone # [phone].  They will contact you to set up this appointment.   _______________________________________________________  If your blood pressure at your visit was 140/90 or greater, please contact your primary care physician to follow up on this.  _______________________________________________________  If you are age 52 or older, your body mass index should be between 23-30. Your Body mass index is 37.59 kg/m. If this is out of the aforementioned range listed, please consider follow up with your Primary Care Provider.  If you are age 49 or younger, your body mass index should be between 19-25. Your Body mass index is 37.59 kg/m. If this is out of the aformentioned range listed, please consider follow up with your Primary Care Provider.   ________________________________________________________  The [facility] providers would like to encourage you to use MYCHART to communicate with providers for non-urgent requests or questions.  Due to long hold times on the telephone, sending your provider a message by [facility] may be a faster and more efficient way to get a response.  Please allow 48 business hours for a response.  Please remember that this is for non-urgent requests.  _______________________________________________________  [facility] is using a team-based approach to care.  Your team is made up of your doctor and two to three APPS. Our APPS (Nurse Practitioners and Physician Assistants) work with your physician to ensure care continuity for you. They are fully qualified to address your health concerns and develop a treatment plan. They communicate directly with your gastroenterologist to care for you. Seeing the Advanced Practice Practitioners on your physician's team can help you by facilitating care more promptly,  often allowing for earlier appointments, access to diagnostic testing, procedures, and other specialty referrals.    I appreciate the opportunity to care for you. Lupita Avram, MD, [facility]

## 2024-02-21 NOTE — Progress Notes (Signed)
 Veronica Hunter 53 y.o. 1970/04/28 [id number]  Assessment & Plan:   Encounter Diagnoses  Name Primary?   Gastroesophageal reflux disease with esophagitis without hemorrhage Yes   Dyspepsia    Urticaria    Shellfish allergy     Dysphagia, unspecified type      Recurrent reflux symptoms off PPI therapy.  Previous omeprazole  effective. Discussed omeprazole  risks and benefits. - Restart omeprazole  40 mg once daily. - She could use this as needed but I think taking on a daily basis makes sense for now. - Referred to [facility] for allergy  evaluation.  Dysphagia Intermittent dysphagia without cause on EGD, barium swallow. ? if could be related to a food allergy . does have cutaneous reactions to shellfish. Not clear she has been fully evaluated for food allergies.. - Referred to [facility] for allergy  evaluation.  She will return to clinic in February. Further plans pending clinical course.  Could need manometric plus or minus pH testing to sort this out further. Subjective:   Chief Complaint: Reflux  HPI Discussed the use of AI scribe software for clinical note transcription with the patient, who gave verbal consent to proceed.   Veronica Hunter is a 54 year old female with reflux who presents with worsening reflux symptoms.  The patient was last seen in October at which point I recommended twice daily 40 mg omeprazole .  She did that for a few weeks and her symptoms all went away.  However as reflected below she developed recurrent symptoms though she has not taken omeprazole  again she has tried some antacids.  Gastroesophageal reflux symptoms - Worsening since last Wednesday - Episodes of epigastric pressure and retrosternal burning - Altered taste sensation - Chest pressure and sensation of gurgling in the stomach, especially postprandially - Symptoms previously well-controlled with omeprazole , which she took for a couple of weeks with improvement -  Currently off omeprazole , using Pepcid as needed until recent exacerbation  Gastrointestinal motility symptoms - Severe constipation (this occurs a couple times a year) - Sensation of difficulty swallowing - Describes a 'vacuum type feeling' that pulls food down quickly - Occasional sensation of food being stuck  Dermatologic symptoms - Hives and rash previously diagnosed as rosacea by PCP and dermatologist - No improvement with topical metronidazole  - Urticaria evaluated by allergist - Zyrtec provides relief for itching and urticaria  Sleep apnea - History of sleep apnea - Completed titration study - Has not yet received a device for sleep apnea  Cardiac symptoms - History of palpitations      EGD 12/21/2023 - LA Grade A reflux esophagitis with no bleeding. Biopsied. - A few gastric polyps. Biopsied. - Gastritis. Biopsied. Antral and body bottles - Sydney protocol used. - Gastroesophageal flap valve classified as Hill Grade I (prominent fold, tight to endoscope).       1. Surgical [P], gastric antrum :       - MILD CHRONIC INACTIVE GASTRITIS.       - NO EVIDENCE OF H. PYLORI ON H&E STAIN.        2. Surgical [P], gastric body :       - FOCAL ACTIVE GASTRITIS.       - IMMUNOHISTOCHEMICAL STAIN FOR H. PYLORI IS NEGATIVE.  Notes      3. Surgical [P], gastric polyps :       - FUNDIC GLAND POLYPS.        4. Surgical [P], distal esophagus :       - BENIGN SQUAMOUS  EPITHELIUM WITH NO SPECIFIC PATHOLOGIC CHANGE.    Wt Readings from Last 3 Encounters:  02/21/24 240 lb (108.9 kg)  02/16/24 235 lb (106.6 kg)  02/08/24 240 lb (108.9 kg)   Barium swallow 02/09/2024-some reflux otherwise negative.  She declined to swallow the tablet.  Allergies[1] Active Medications[2] Past Medical History:  Diagnosis Date   Anxiety    Back pain    Bilateral swelling of feet    Breast calcifications on mammogram    bilateral; watching, will have follow-up every 6 months for 2 yrs   Chest  pain    Constipation    Diverticulitis    Dizziness    Fibromyalgia    GERD (gastroesophageal reflux disease)    H/O fracture 2020   left foot, right ankle   Headache    Heartburn    High blood pressure    Indigestion    Joint pain    Palpitations    Shellfish allergy     Vitamin B12 deficiency    Vitamin D  deficiency    Past Surgical History:  Procedure Laterality Date   ESOPHAGOGASTRODUODENOSCOPY  2025   GALLBLADDER SURGERY  1996   right ankle fracture surgery  2020   2 plates/10 screws    right ankle tendon repair  2010   TONSILLECTOMY  1996   TOTAL VAGINAL HYSTERECTOMY  2009   Social History   Social History Narrative   Lives at home with spouse & daughter   Right handed   Caffeine: 2 glasses of sweet tea/day   family history includes Allergic rhinitis in her mother, paternal aunt, and son; Cancer in her mother; Diabetes in her maternal grandmother and mother; Food Allergy  in her son; Heart Problems in her mother; Heart attack in her mother; Heart disease in her mother; Hyperlipidemia in her mother; Hypertension in her mother; Sinusitis in her mother, paternal aunt, and son; Sudden death in her father and mother; Urticaria in her mother.   Review of Systems See HPI  Objective:   Physical Exam BP 122/72   Pulse 63   Ht 5' 7 (1.702 m)   Wt 240 lb (108.9 kg)   SpO2 96%   BMI 37.59 kg/m      [1]  Allergies Allergen Reactions   Shellfish Allergy  Other (See Comments)    Allergy  tested at doctor's office   Cephalosporins Rash   Epinephrine  Other (See Comments)    really bad racing heart and everything that's not supposed to happen happens, occurs even with epinephrine  in a numbing agent. Patient states years ago her dentist told her to put Epinephrine  on her allergy  list.   Escitalopram Oxalate Other (See Comments)    Did not like the way it made her feel.   No Healthtouch Food Allergies Rash    Mustard, Pork, and Chocolate: Migraines, rash, N/V    Sudafed [Pseudoephedrine] Other (See Comments)    Heart races  [2]  Current Meds  Medication Sig   EPINEPHrine  0.3 mg/0.3 mL IJ SOAJ injection    estradiol  (ESTRACE ) 0.01 % CREA vaginal cream Apply 0.5g to vulva nightly for 2 weeks then 2 times a week.   ibuprofen (ADVIL) 800 MG tablet Take 800 mg by mouth 3 (three) times daily as needed.   metoprolol  succinate (TOPROL  XL) 25 MG 24 hr tablet Take 1 tablet (25 mg total) by mouth daily.   metoprolol  tartrate (LOPRESSOR ) 25 MG tablet Take 0.5 tablets (12.5 mg total) by mouth as needed. Daily as needed   Multiple  Vitamin (MULTIVITAMIN) tablet Take 1 tablet by mouth daily.   nystatin  (MYCOSTATIN /NYSTOP ) powder Apply 1 Application topically 2 (two) times daily.   omeprazole  (PRILOSEC) 40 MG capsule Take 1 capsule (40 mg total) by mouth daily before breakfast.   VITAMIN D  PO Take by mouth daily.

## 2024-02-22 ENCOUNTER — Encounter: Payer: Self-pay | Admitting: Obstetrics and Gynecology

## 2024-02-22 ENCOUNTER — Ambulatory Visit: Admitting: Obstetrics and Gynecology

## 2024-02-22 VITALS — BP 126/60 | HR 80 | Temp 97.9°F | Ht 67.5 in | Wt 239.0 lb

## 2024-02-22 DIAGNOSIS — F419 Anxiety disorder, unspecified: Secondary | ICD-10-CM | POA: Diagnosis not present

## 2024-02-22 DIAGNOSIS — Z90710 Acquired absence of both cervix and uterus: Secondary | ICD-10-CM | POA: Diagnosis not present

## 2024-02-22 DIAGNOSIS — N898 Other specified noninflammatory disorders of vagina: Secondary | ICD-10-CM | POA: Diagnosis not present

## 2024-02-22 DIAGNOSIS — Z1331 Encounter for screening for depression: Secondary | ICD-10-CM

## 2024-02-22 DIAGNOSIS — Z01419 Encounter for gynecological examination (general) (routine) without abnormal findings: Secondary | ICD-10-CM | POA: Diagnosis not present

## 2024-02-22 DIAGNOSIS — N958 Other specified menopausal and perimenopausal disorders: Secondary | ICD-10-CM | POA: Diagnosis not present

## 2024-02-22 LAB — WET PREP FOR TRICH, YEAST, CLUE

## 2024-02-22 MED ORDER — HYDROXYZINE HCL 25 MG PO TABS: 25.0000 mg | ORAL_TABLET | Freq: Two times a day (BID) | ORAL | 2 refills | Status: AC | PRN

## 2024-02-22 NOTE — Progress Notes (Unsigned)
 53 y.o. [id number] female s/p TVH, BSO with [facility] here for annual exam. Married. PCP: Lazoff, Shawn P, DO   No LMP recorded. Patient has had a hysterectomy.   She reports yeast on skin that's very itchy. Using powder and cream but it flares up again.  Wants to make sure the BV is cleared. Started CPAP 2/2 OSA Started working with a trainer on weight loss Considering restarting ERT for anxiety Feels she gets startled, very circumstantial, may not have symptoms for months and then will have daily sx PVCs are well controlled with metoprolol   Abnormal bleeding: none Pelvic discharge or pain: none Breast mass, nipple discharge or skin changes : none  Sexually active: yes  Last PAP:     Component Value Date/Time   DIAGPAP  10/28/2022 1230    - Negative for intraepithelial lesion or malignancy (NILM)   HPVHIGH Negative 10/28/2022 1230   ADEQPAP Satisfactory for evaluation. 10/28/2022 1230   Last mammogram: 07/07/23 CARE, BIRADS 3; Oct 2025 MMG past due Last colonoscopy: cologuard 2024  Exercising: Yes, works with psychologist, educational, core and strength train  Smoker: no  Garment/textile Technologist Visit from 02/22/2024 in [facility]  PHQ-2 Total Score 0    Flowsheet Row Office Visit from 12/10/2021 in [facility]  PHQ-9 Total Score 16     GYN HISTORY: No sig hx  OB History  Gravida Para Term Preterm AB Living  3 2 2  1 2   SAB IAB Ectopic Multiple Live Births      2    # Outcome Date GA Lbr Len/2nd Weight Sex Type Anes PTL Lv  3 AB           2 Term      Vag-Spont   LIV  1 Term      Vag-Spont   LIV   Past Medical History:  Diagnosis Date   Anxiety    Back pain    Bilateral swelling of feet    Breast calcifications on mammogram    bilateral; watching, will have follow-up every 6 months for 2 yrs   Chest pain    Constipation    Diverticulitis    Dizziness    Fibromyalgia    GERD (gastroesophageal reflux disease)     H/O fracture 2020   left foot, right ankle   Headache    Heartburn    High blood pressure    Indigestion    Joint pain    Palpitations    Shellfish allergy     Sleep apnea    Vitamin B12 deficiency    Vitamin D  deficiency    Past Surgical History:  Procedure Laterality Date   ESOPHAGOGASTRODUODENOSCOPY  2025   GALLBLADDER SURGERY  1996   right ankle fracture surgery  2020   2 plates/10 screws    right ankle tendon repair  2010   TONSILLECTOMY  1996   TOTAL VAGINAL HYSTERECTOMY  2009   Medications Ordered Prior to Encounter[1] Social History   Socioeconomic History   Marital status: Married    Spouse name: Not on file   Number of children: 2   Years of education: Not on file   Highest education level: Bachelor's degree (e.g., BA, AB, BS)  Occupational History   Occupation: Part time music therapist  Tobacco Use   Smoking status: Never    Passive exposure: Never   Smokeless tobacco: Never  Vaping Use   Vaping status: Never Used  Substance and Sexual Activity   Alcohol use: Never   Drug use: Never   Sexual activity: Yes    Partners: Male    Birth control/protection: Surgical    Comment: hysterectomy, menarche 53yo, sexual debut 53yo  Other Topics Concern   Not on file  Social History Narrative   Lives at home with spouse & daughter   Right handed   Caffeine: 2 glasses of sweet tea/day   Social Drivers of Health   Tobacco Use: Low Risk (02/22/2024)   Patient History    Smoking Tobacco Use: Never    Smokeless Tobacco Use: Never    Passive Exposure: Never  Financial Resource Strain: Low Risk (01/26/2024)   Received from [facility]   Overall Financial Resource Strain (CARDIA)    How hard is it for you to pay for the very basics like food, housing, medical care, and heating?: Not hard at all  Food Insecurity: No Food Insecurity (01/26/2024)   Received from [facility]   Epic    Within the past 12 months, you worried that your food would run out  before you got the money to buy more.: Never true    Within the past 12 months, the food you bought just didn't last and you didn't have money to get more.: Never true  Transportation Needs: No Transportation Needs (01/26/2024)   Received from [facility]    In the past 12 months, has lack of transportation kept you from medical appointments or from getting medications?: No    In the past 12 months, has lack of transportation kept you from meetings, work, or from getting things needed for daily living?: No  Physical Activity: Not on file  Stress: Not on file  Social Connections: Not on file  Intimate Partner Violence: Not on file  Depression (PHQ2-9): Low Risk (02/22/2024)   Depression (PHQ2-9)    PHQ-2 Score: 0  Alcohol Screen: Not on file  Housing: Low Risk (01/26/2024)   [facility] from [facility]    In the last 12 months, was there a time when you were not able to pay the mortgage or rent on time?: No    In the past 12 months, how many times have you moved where you were living?: 0    At any time in the past 12 months, were you homeless or living in a shelter (including now)?: No  Utilities: Not At Risk (01/26/2024)   Received from [facility]    In the past 12 months has the electric, gas, oil, or water company threatened to shut off services in your home?: No  Health Literacy: Not on file   Family History  Problem Relation Age of Onset   Sinusitis Mother    Urticaria Mother    Allergic rhinitis Mother    Hyperlipidemia Mother    Hypertension Mother    Heart attack Mother    Cancer Mother        uterine possibly   Diabetes Mother    Heart Problems Mother    Heart disease Mother    Sudden death Mother    Sudden death Father    Allergic rhinitis Paternal Aunt    Sinusitis Paternal Aunt    Diabetes Maternal Grandmother    Sinusitis Son    Allergic rhinitis Son    Food Allergy  Son    Migraines Neg Hx    Colon cancer Neg Hx    Esophageal  cancer  Neg Hx    Stomach cancer Neg Hx    Asthma Neg Hx    Immunodeficiency Neg Hx    Atopy Neg Hx    Angioedema Neg Hx    Eczema Neg Hx    Rectal cancer Neg Hx    Allergies[2]   PE Today's Vitals   02/22/24 1507  BP: 126/60  Pulse: 80  Temp: 97.9 F (36.6 C)  TempSrc: Oral  SpO2: 96%  Weight: 239 lb (108.4 kg)  Height: 5' 7.5 (1.715 m)   Body mass index is 36.88 kg/m.  Physical Exam Vitals reviewed. Exam conducted with a chaperone present.  Constitutional:      General: She is not in acute distress.    Appearance: Normal appearance.  HENT:     Head: Normocephalic and atraumatic.     Nose: Nose normal.  Eyes:     Extraocular Movements: Extraocular movements intact.     Conjunctiva/sclera: Conjunctivae normal.  Pulmonary:     Effort: Pulmonary effort is normal.  Chest:     Chest wall: No mass or tenderness.  Breasts:    Right: Normal. No swelling, mass, nipple discharge, skin change or tenderness.     Left: Normal. No swelling, mass, nipple discharge, skin change or tenderness.  Abdominal:     General: There is no distension.     Palpations: Abdomen is soft.     Tenderness: There is no abdominal tenderness.  Genitourinary:    General: Normal vulva.     Exam position: Lithotomy position.     Urethra: No prolapse.     Vagina: Normal. No vaginal discharge or bleeding.     Cervix: Normal. No lesion.     Uterus: Normal. Not enlarged and not tender.      Adnexa: Right adnexa normal and left adnexa normal.  Musculoskeletal:        General: Normal range of motion.     Cervical back: Normal range of motion.  Lymphadenopathy:     Upper Body:     Right upper body: No axillary adenopathy.     Left upper body: No axillary adenopathy.     Lower Body: No right inguinal adenopathy. No left inguinal adenopathy.  Skin:    General: Skin is warm and dry.  Neurological:     General: No focal deficit present.     Mental Status: She is alert.  Psychiatric:        Mood  and Affect: Mood normal.        Behavior: Behavior normal.      Assessment and Plan:        Well woman exam with routine gynecological exam  Genitourinary syndrome of menopause  H/O: hysterectomy  Negative depression screening  Anxiety -     hydrOXYzine  HCl; Take 1 tablet (25 mg total) by mouth 2 (two) times daily as needed.  Dispense: 30 tablet; Refill: 2  Vaginal discharge -     WET PREP FOR TRICH, YEAST, CLUE   Vera LULLA Pa, MD      [1]  Current Outpatient Medications on File Prior to Visit  Medication Sig Dispense Refill   doxycycline (VIBRAMYCIN) 100 MG capsule Take 100 mg by mouth 2 (two) times daily.     EPINEPHrine  0.3 mg/0.3 mL IJ SOAJ injection      estradiol  (ESTRACE ) 0.01 % CREA vaginal cream Apply 0.5g to vulva nightly for 2 weeks then 2 times a week. 42.5 g 1   ibuprofen (ADVIL) 800 MG tablet Take  800 mg by mouth 3 (three) times daily as needed.     metoprolol  succinate (TOPROL  XL) 25 MG 24 hr tablet Take 1 tablet (25 mg total) by mouth daily.     metoprolol  tartrate (LOPRESSOR ) 25 MG tablet Take 0.5 tablets (12.5 mg total) by mouth as needed. Daily as needed 90 tablet 3   Multiple Vitamin (MULTIVITAMIN) tablet Take 1 tablet by mouth daily.     neomycin-polymyxin-dexamethasone (MAXITROL) 0.1 % ophthalmic suspension SHAKE LIQUID AND INSTILL 15 ML IN LEFT EYE TWICE DAILY FOR 1 WEEK     nystatin  (MYCOSTATIN /NYSTOP ) powder Apply 1 Application topically 2 (two) times daily. 30 g 1   omeprazole  (PRILOSEC) 40 MG capsule Take 1 capsule (40 mg total) by mouth daily before breakfast.     VITAMIN D  PO Take by mouth daily.     No current facility-administered medications on file prior to visit.  [2]  Allergies Allergen Reactions   Shellfish Allergy  Other (See Comments)    Allergy  tested at doctor's office   Cephalosporins Rash   Epinephrine  Other (See Comments)    really bad racing heart and everything that's not supposed to happen happens, occurs even with  epinephrine  in a numbing agent. Patient states years ago her dentist told her to put Epinephrine  on her allergy  list.   Escitalopram Oxalate Other (See Comments)    Did not like the way it made her feel.   No Healthtouch Food Allergies Rash    Mustard, Pork, and Chocolate: Migraines, rash, N/V   Sudafed [Pseudoephedrine] Other (See Comments)    Heart races

## 2024-02-22 NOTE — Patient Instructions (Addendum)
 Please complete follow-up mammogram.  For patients under 50-54yo, I recommend 1200mg  calcium  daily and 600IU of vitamin D  daily. For patients over 54yo, I recommend 1200mg  calcium  daily and 800IU of vitamin D  daily.  Health Maintenance, Female Adopting a healthy lifestyle and getting preventive care are important in promoting health and wellness. Ask your health care provider about: The right schedule for you to have regular tests and exams. Things you can do on your own to prevent diseases and keep yourself healthy. What should I know about diet, weight, and exercise? Eat a healthy diet  Eat a diet that includes plenty of vegetables, fruits, low-fat dairy products, and lean protein. Do not eat a lot of foods that are high in solid fats, added sugars, or sodium. Maintain a healthy weight Body mass index (BMI) is used to identify weight problems. It estimates body fat based on height and weight. Your health care provider can help determine your BMI and help you achieve or maintain a healthy weight. Get regular exercise Get regular exercise. This is one of the most important things you can do for your health. Most adults should: Exercise for at least 150 minutes each week. The exercise should increase your heart rate and make you sweat (moderate-intensity exercise). Do strengthening exercises at least twice a week. This is in addition to the moderate-intensity exercise. Spend less time sitting. Even light physical activity can be beneficial. Watch cholesterol and blood lipids Have your blood tested for lipids and cholesterol at 54 years of age, then have this test every 5 years. Have your cholesterol levels checked more often if: Your lipid or cholesterol levels are high. You are older than 54 years of age. You are at high risk for heart disease. What should I know about cancer screening? Depending on your health history and family history, you may need to have cancer screening at various  ages. This may include screening for: Breast cancer. Cervical cancer. Colorectal cancer. Skin cancer. Lung cancer. What should I know about heart disease, diabetes, and high blood pressure? Blood pressure and heart disease High blood pressure causes heart disease and increases the risk of stroke. This is more likely to develop in people who have high blood pressure readings or are overweight. Have your blood pressure checked: Every 3-5 years if you are 54-35 years of age. Every year if you are 54 years old or older. Diabetes Have regular diabetes screenings. This checks your fasting blood sugar level. Have the screening done: Once every three years after age 23 if you are at a normal weight and have a low risk for diabetes. More often and at a younger age if you are overweight or have a high risk for diabetes. What should I know about preventing infection? Hepatitis B If you have a higher risk for hepatitis B, you should be screened for this virus. Talk with your health care provider to find out if you are at risk for hepatitis B infection. Hepatitis C Testing is recommended for: Everyone born from [birth year] through 1966. Anyone with known risk factors for hepatitis C. Sexually transmitted infections (STIs) Get screened for STIs, including gonorrhea and chlamydia, if: You are sexually active and are younger than 54 years of age. You are older than 54 years of age and your health care provider tells you that you are at risk for this type of infection. Your sexual activity has changed since you were last screened, and you are at increased risk for chlamydia or gonorrhea. Ask  your health care provider if you are at risk. Ask your health care provider about whether you are at high risk for HIV. Your health care provider may recommend a prescription medicine to help prevent HIV infection. If you choose to take medicine to prevent HIV, you should first get tested for HIV. You should then be tested  every 3 months for as long as you are taking the medicine. Osteoporosis and menopause Osteoporosis is a disease in which the bones lose minerals and strength with aging. This can result in bone fractures. If you are 54 years old or older, or if you are at risk for osteoporosis and fractures, ask your health care provider if you should: Be screened for bone loss. Take a calcium  or vitamin D  supplement to lower your risk of fractures. Be given hormone replacement therapy (HRT) to treat symptoms of menopause. Follow these instructions at home: Alcohol use Do not drink alcohol if: Your health care provider tells you not to drink. You are pregnant, may be pregnant, or are planning to become pregnant. If you drink alcohol: Limit how much you have to: 0-1 drink a day. Know how much alcohol is in your drink. In the [place] one drink equals one 12 oz bottle of beer (355 mL), one 5 oz glass of wine (148 mL), or one 1 oz glass of hard liquor (44 mL). Lifestyle Do not use any products that contain nicotine or tobacco. These products include cigarettes, chewing tobacco, and vaping devices, such as e-cigarettes. If you need help quitting, ask your health care provider. Do not use street drugs. Do not share needles. Ask your health care provider for help if you need support or information about quitting drugs. General instructions Schedule regular health, dental, and eye exams. Stay current with your vaccines. Tell your health care provider if: You often feel depressed. You have ever been abused or do not feel safe at home. Summary Adopting a healthy lifestyle and getting preventive care are important in promoting health and wellness. Follow your health care provider's instructions about healthy diet, exercising, and getting tested or screened for diseases. Follow your health care provider's instructions on monitoring your cholesterol and blood pressure. This information is not intended to replace  advice given to you by your health care provider. Make sure you discuss any questions you have with your health care provider. Document Revised: 07/15/2020 Document Reviewed: 07/15/2020 Elsevier Patient Education  2024 [employer].

## 2024-02-23 DIAGNOSIS — N958 Other specified menopausal and perimenopausal disorders: Secondary | ICD-10-CM | POA: Insufficient documentation

## 2024-02-23 DIAGNOSIS — F419 Anxiety disorder, unspecified: Secondary | ICD-10-CM | POA: Insufficient documentation

## 2024-02-23 NOTE — Assessment & Plan Note (Signed)
 Would recommend treatment of anxiety symptoms over HRT given prior side effects (palpitations, now on metoprolol ) and lack of resolution of concerning symptoms during 6 month trial. Recommend starting PRN treatment given circumstantial symptoms Continue working to improve sleep and weight with new use of CPAP and strength training F/u in 3 months to reassess symptoms, can consider restarting HRT at that time if unimproved

## 2024-02-23 NOTE — Assessment & Plan Note (Signed)
 Cervical cancer screening: N/A- TVH Encouraged annual mammogram screening, past due! Cologuard UTD DXA N/A Labs and immunizations with her primary Encouraged safe sexual practices as indicated Encouraged healthy lifestyle practices with diet and exercise For patients under 50-54yo, I recommend 1200mg  calcium  daily and 600IU of vitamin D  daily.

## 2024-02-23 NOTE — Assessment & Plan Note (Signed)
 Continue estrace , wet mount neg for BV

## 2024-02-29 ENCOUNTER — Encounter: Payer: Self-pay | Admitting: Diagnostic Neuroimaging

## 2024-02-29 ENCOUNTER — Ambulatory Visit: Admitting: Diagnostic Neuroimaging

## 2024-02-29 VITALS — BP 146/73 | HR 80 | Ht 67.0 in | Wt 241.2 lb

## 2024-02-29 DIAGNOSIS — G43109 Migraine with aura, not intractable, without status migrainosus: Secondary | ICD-10-CM | POA: Diagnosis not present

## 2024-02-29 MED ORDER — TOPIRAMATE 50 MG PO TABS: 50.0000 mg | ORAL_TABLET | Freq: Two times a day (BID) | ORAL | 12 refills | Status: AC

## 2024-02-29 MED ORDER — NURTEC 75 MG PO TBDP: 75.0000 mg | ORAL_TABLET | Freq: Every day | ORAL | 6 refills | Status: AC | PRN

## 2024-02-29 NOTE — Patient Instructions (Addendum)
" ° °  MIGRAINE TREATMENT PLAN:  MIGRAINE PREVENTION  LIFESTYLE CHANGES -Stop or avoid smoking -Decrease or avoid caffeine / alcohol -Eat and sleep on a regular schedule -Exercise several times per week - start topiramate  50mg  at bedtime; after 1-2 weeks increase to 50mg  twice a day; drink plenty of water  MIGRAINE RESCUE  - ibuprofen, tylenol  as needed - AVOID TRIPTANS (due to current PVC and palpitations) - start rimegepant (Nurtec) 75mg  as needed for breakthrough headache; max 8 per month "

## 2024-02-29 NOTE — Progress Notes (Signed)
 "  [facility]  PATIENT: Veronica Hunter DOB: [birth year]  REFERRING CLINICIAN: Lazoff, Shawn P, DO HISTORY FROM: patient  REASON FOR VISIT: new consult (former patient Dr. Ines)   HISTORICAL  CHIEF COMPLAINT:  Chief Complaint  Patient presents with   RM 7     Patient is here alone for headaches and neck pain - Former Dr.Ahern patient. Last seen 07/14/23     HISTORY OF PRESENT ILLNESS:   53 year old female here for evaluation of neck pain, vertigo and headaches.  Patient is history of headaches with migraine features since college.  She describes pain in the back of her head and neck, over her head with squeezing sensation, associated with nausea, vertigo, sensitivity to light.  Headaches can last 15 minutes up to hours at a time.  In the last 6 months has been having 2-3 of these headaches per week.  In the past has had evaluation of chronic neck tightness and pain, which she describes as muscular in origin.  She tried trigger point ejections with mild relief.  Also has some intermittent vertigo but rocking sensations that are sometimes associated headaches and sometimes by themselves.  Sometimes she sees spots and shadows.  At nighttime when she is laying down she sees shimmering wavelike patterns.  In last 6 months has also limited caffeine from her diet.  She has also adopted a low sugar diet.   REVIEW OF SYSTEMS: Full 14 system review of systems performed and negative with exception of: as per HPI.  ALLERGIES: Allergies[1]  HOME MEDICATIONS: Outpatient Medications Prior to Visit  Medication Sig Dispense Refill   EPINEPHrine  0.3 mg/0.3 mL IJ SOAJ injection      estradiol  (ESTRACE ) 0.01 % CREA vaginal cream Apply 0.5g to vulva nightly for 2 weeks then 2 times a week. 42.5 g 1   hydrOXYzine  (ATARAX ) 25 MG tablet Take 1 tablet (25 mg total) by mouth 2 (two) times daily as needed. 30 tablet 2   ibuprofen (ADVIL) 800 MG tablet Take 800 mg by mouth 3 (three)  times daily as needed.     metoprolol  succinate (TOPROL  XL) 25 MG 24 hr tablet Take 1 tablet (25 mg total) by mouth daily.     metoprolol  tartrate (LOPRESSOR ) 25 MG tablet Take 0.5 tablets (12.5 mg total) by mouth as needed. Daily as needed 90 tablet 3   Multiple Vitamin (MULTIVITAMIN) tablet Take 1 tablet by mouth daily.     neomycin-polymyxin-dexamethasone (MAXITROL) 0.1 % ophthalmic suspension SHAKE LIQUID AND INSTILL 15 ML IN LEFT EYE TWICE DAILY FOR 1 WEEK     nystatin  (MYCOSTATIN /NYSTOP ) powder Apply 1 Application topically 2 (two) times daily. 30 g 1   omeprazole  (PRILOSEC) 40 MG capsule Take 1 capsule (40 mg total) by mouth daily before breakfast.     VITAMIN D  PO Take by mouth daily.     doxycycline (VIBRAMYCIN) 100 MG capsule Take 100 mg by mouth 2 (two) times daily. (Patient not taking: Reported on 02/29/2024)     No facility-administered medications prior to visit.    PAST MEDICAL HISTORY: Past Medical History:  Diagnosis Date   Anxiety    Back pain    Bilateral swelling of feet    Breast calcifications on mammogram    bilateral; watching, will have follow-up every 6 months for 2 yrs   Chest pain    Constipation    Diverticulitis    Dizziness    Fibromyalgia    GERD (gastroesophageal reflux disease)    H/O  fracture 2020   left foot, right ankle   Headache    Heartburn    High blood pressure    Indigestion    Joint pain    Palpitations    Shellfish allergy     Sleep apnea    Vitamin B12 deficiency    Vitamin D  deficiency     PAST SURGICAL HISTORY: Past Surgical History:  Procedure Laterality Date   ESOPHAGOGASTRODUODENOSCOPY  2025   GALLBLADDER SURGERY  1996   right ankle fracture surgery  2020   2 plates/10 screws    right ankle tendon repair  2010   TONSILLECTOMY  1996   TOTAL VAGINAL HYSTERECTOMY  2009    FAMILY HISTORY: Family History  Problem Relation Age of Onset   Sinusitis Mother    Urticaria Mother    Allergic rhinitis Mother     Hyperlipidemia Mother    Hypertension Mother    Heart attack Mother    Cancer Mother        uterine possibly   Diabetes Mother    Heart Problems Mother    Heart disease Mother    Sudden death Mother    Sudden death Father    Diabetes Maternal Grandmother    Sinusitis Son    Allergic rhinitis Son    Food Allergy  Son    Allergic rhinitis Paternal Aunt    Sinusitis Paternal Aunt    Migraines Neg Hx    Colon cancer Neg Hx    Esophageal cancer Neg Hx    Stomach cancer Neg Hx    Asthma Neg Hx    Immunodeficiency Neg Hx    Atopy Neg Hx    Angioedema Neg Hx    Eczema Neg Hx    Rectal cancer Neg Hx    Seizures Neg Hx    Stroke Neg Hx     SOCIAL HISTORY: Social History   Socioeconomic History   Marital status: Married    Spouse name: Not on file   Number of children: 2   Years of education: Not on file   Highest education level: Bachelor's degree (e.g., BA, AB, BS)  Occupational History   Occupation: Part time music therapist  Tobacco Use   Smoking status: Never    Passive exposure: Never   Smokeless tobacco: Never  Vaping Use   Vaping status: Never Used  Substance and Sexual Activity   Alcohol use: Never   Drug use: Never   Sexual activity: Yes    Partners: Male    Birth control/protection: Surgical    Comment: hysterectomy, menarche 53yo, sexual debut 53yo  Other Topics Concern   Not on file  Social History Narrative   Lives at home with spouse & daughter   Right handed   Caffeine: rare due to health issues    Social Drivers of Health   Tobacco Use: Low Risk (02/29/2024)   Received from [facility]   Patient History    Smoking Tobacco Use: Never    Smokeless Tobacco Use: Never    Passive Exposure: Past  Financial Resource Strain: Low Risk (01/26/2024)   Received from [facility]   Overall Financial Resource Strain (CARDIA)    How hard is it for you to pay for the very basics like food, housing, medical care, and heating?: Not hard at all  Food  Insecurity: Low Risk (02/29/2024)   Received from [facility]   Epic    Within the past 12 months, you worried that your food would run out before  you got money to buy more: Never true    Within the past 12 months, the food you bought just didn't last and you didn't have money to get more. : Never true  Transportation Needs: No Transportation Needs (02/29/2024)   Received from [facility]    In the past 12 months, has lack of reliable transportation kept you from medical appointments, meetings, work or from getting things needed for daily living? : No  Physical Activity: Not on file  Stress: Not on file  Social Connections: Not on file  Intimate Partner Violence: Not on file  Depression (PHQ2-9): Low Risk (02/22/2024)   Depression (PHQ2-9)    PHQ-2 Score: 0  Alcohol Screen: Not on file  Housing: Low Risk (02/29/2024)   Received from [facility]   Epic    What is your living situation today?: I have a steady place to live    Think about the place you live. Do you have problems with any of the following? Choose all that apply:: None/None on this list  Utilities: Low Risk (02/29/2024)   Received from [facility]   Utilities    In the past 12 months has the electric, gas, oil, or water company threatened to shut off services in your home? : No  Health Literacy: Not on file     PHYSICAL EXAM  GENERAL EXAM/CONSTITUTIONAL: Vitals:  Vitals:   02/29/24 1556  BP: (!) 146/73  Pulse: 80  Weight: 241 lb 3.2 oz (109.4 kg)  Height: 5' 7 (1.702 m)   Body mass index is 37.78 kg/m. Wt Readings from Last 3 Encounters:  02/29/24 241 lb 3.2 oz (109.4 kg)  02/22/24 239 lb (108.4 kg)  02/21/24 240 lb (108.9 kg)   Patient is in no distress; well developed, nourished and groomed; neck is supple  CARDIOVASCULAR: Examination of carotid arteries is normal; no carotid bruits Regular rate and rhythm, no murmurs Examination of peripheral vascular system by observation  and palpation is normal  EYES: Ophthalmoscopic exam of optic discs and posterior segments is normal; no papilledema or hemorrhages No results found.  MUSCULOSKELETAL: Gait, strength, tone, movements noted in Neurologic exam below  NEUROLOGIC: MENTAL STATUS:      No data to display         awake, alert, oriented to person, place and time recent and remote memory intact normal attention and concentration language fluent, comprehension intact, naming intact fund of knowledge appropriate  CRANIAL NERVE:  2nd - no papilledema on fundoscopic exam 2nd, 3rd, 4th, 6th - pupils equal and reactive to light, visual fields full to confrontation, extraocular muscles intact, no nystagmus 5th - facial sensation symmetric 7th - facial strength symmetric 8th - hearing intact 9th - palate elevates symmetrically, uvula midline 11th - shoulder shrug symmetric 12th - tongue protrusion midline  MOTOR:  normal bulk and tone, full strength in the BUE, BLE  SENSORY:  normal and symmetric to light touch, temperature, vibration  COORDINATION:  finger-nose-finger, fine finger movements normal  REFLEXES:  deep tendon reflexes 1+ and symmetric  GAIT/STATION:  narrow based gait     DIAGNOSTIC DATA (LABS, IMAGING, TESTING) - I reviewed patient records, labs, notes, testing and imaging myself where available.  Lab Results  Component Value Date   WBC 8.7 10/27/2023   HGB 12.9 10/27/2023   HCT 40.5 10/27/2023   MCV 90.2 10/27/2023   PLT 175 10/27/2023      Component Value Date/Time   NA 137 10/27/2023 0042  NA 141 12/11/2022 1033   K 4.0 10/27/2023 0042   CL 102 10/27/2023 0042   CO2 27 10/27/2023 0042   GLUCOSE 99 10/27/2023 0042   BUN 16 10/27/2023 0042   BUN 13 12/11/2022 1033   CREATININE 0.78 10/27/2023 0042   CALCIUM  8.8 (L) 10/27/2023 0042   PROT 7.4 10/27/2023 0042   PROT 7.4 08/31/2022 1550   ALBUMIN 3.8 10/27/2023 0042   ALBUMIN 4.3 08/31/2022 1550   AST 22  10/27/2023 0042   ALT 24 10/27/2023 0042   ALKPHOS 56 10/27/2023 0042   BILITOT 0.4 10/27/2023 0042   BILITOT 0.3 08/31/2022 1550   GFRNONAA >60 10/27/2023 0042   Lab Results  Component Value Date   CHOL 158 12/11/2022   HDL 41 12/11/2022   LDLCALC 100 (H) 12/11/2022   TRIG 90 12/11/2022   CHOLHDL 3.9 12/11/2022   Lab Results  Component Value Date   HGBA1C 5.5 12/10/2021   Lab Results  Component Value Date   VITAMINB12 815 07/14/2023   Lab Results  Component Value Date   TSH 2.400 08/31/2022    07/04/20 MRI brain - No cause of the presenting symptoms is identified. Normal study with  exception of a few scattered foci of T2 and FLAIR signal in the  cerebral hemispheric white matter consistent with an early  manifestation of small vessel change.   02/17/24 MRI cervical spine - Mild cervical spondylosis most significant at C3-4 with grade 1 anterolisthesis and disc bulge causing mild canal stenosis with severe right neuroforaminal narrowing.     ASSESSMENT AND PLAN  53 y.o. year old female here with:   Dx:  1. Migraine with aura and without status migrainosus, not intractable       PLAN:  MIGRAINE TREATMENT PLAN (migraine with aura; vestibular migraine)  MIGRAINE PREVENTION  LIFESTYLE CHANGES -Stop or avoid smoking -Decrease or avoid caffeine / alcohol -Eat and sleep on a regular schedule -Exercise several times per week - consider topiramate  50mg  at bedtime; after 1-2 weeks increase to 50mg  twice a day; drink plenty of water  Consider 2nd line (if 1st line not working or tolerated) - rimegepant (Nurtec) 75mg  every other day - atogepant Woody) 60mg  daily - erenumab (Aimovig) 70mg  monthly (may increase to 140mg  monthly) - fremanezumab (Ajovy) 225mg  monthly (or 675mg  every 3 months) - galazanezumab (Emgality) 240mg  loading dose; then 120mg  monthly   MIGRAINE RESCUE  - ibuprofen, tylenol  as needed - AVOID TRIPTANS (due to current PVC and  palpitations) - start rimegepant (Nurtec) 75mg  as needed for breakthrough headache; max 8 per month   CHRONIC NECK PAIN (musculoskeletal) - follow up with spine / pain clinic   Meds ordered this encounter  Medications   topiramate  (TOPAMAX ) 50 MG tablet    Sig: Take 1 tablet (50 mg total) by mouth 2 (two) times daily.    Dispense:  60 tablet    Refill:  12   Rimegepant Sulfate (NURTEC) 75 MG TBDP    Sig: Take 1 tablet (75 mg total) by mouth daily as needed.    Dispense:  8 tablet    Refill:  6   Return in about 4 months (around 06/29/2024) for MyChart visit (15 min).    EDUARD FABIENE HANLON, MD 02/29/2024, 4:35 PM Certified in Neurology, Neurophysiology and Neuroimaging  [facility] [address] [place], [place] [address] [phone]     [1]  Allergies Allergen Reactions   Shellfish Allergy  Other (See Comments)    Allergy   tested at doctor's office   Cephalosporins Rash   Epinephrine  Other (See Comments)    really bad racing heart and everything that's not supposed to happen happens, occurs even with epinephrine  in a numbing agent. Patient states years ago her dentist told her to put Epinephrine  on her allergy  list.   Escitalopram Oxalate Other (See Comments)    Did not like the way it made her feel.   No [employer] Food Allergies Rash    Mustard, Pork, and Chocolate: Migraines, rash, N/V   Sudafed [Pseudoephedrine] Other (See Comments)    Heart races   "

## 2024-03-06 ENCOUNTER — Encounter: Payer: Self-pay | Admitting: Cardiology

## 2024-03-07 NOTE — Procedures (Addendum)
" °  Indications for Polysomnography The patient is a 53 year old Female who is 5' 8 and weighs 235.0 lbs. Her BMI equals 36.0.  A full night titration treatment study was performed.  No medications were reported taken during the night.No Data. Polysomnogram Data A full night polysomnogram recorded the standard physiologic parameters including EEG, EOG, EMG, EKG, nasal and oral airflow.  Respiratory parameters of chest and abdominal movements were recorded with Respiratory Inductance Plethysmography belts.   Oxygen saturation was recorded by pulse oximetry.  Sleep Architecture The total recording time of the polysomnogram was 376.3 minutes.  The total sleep time was 283.0 minutes.  The patient spent 17.8% of total sleep time in Stage N1, 73.5% in Stage N2, 0.0% in Stages N3, and 8.7% in REM.  Sleep latency was 5.3 minutes.   REM latency was 168.5 minutes.  Sleep Efficiency was 75.2%.  Wake after Sleep Onset time was 87.5 minutes.  Titration Summary The patient was titrated at pressures ranging from 6 cm/H20 up to 23/19cm/H20. The last pressure used in the study was 23/19 cm/H20.  Respiratory Events The polysomnogram revealed a presence of 8 obstructive, 13 central, and 1 mixed apnea resulting in an Apnea index of 4.7 events per hour.  There were 49 hypopneas (GreaterEqual to3% desaturation and/or arousal) resulting in an Apnea\Hypopnea Index (AHI  GreaterEqual to3% desaturation and/or arousal) of 15.1 events per hour.  There were 13 hypopneas (GreaterEqual to4% desaturation) resulting in an Apnea\Hypopnea Index (AHI GreaterEqual to4% desaturation) of 7.4 events per hour.  There were 162  Respiratory Effort Related Arousals resulting in a RERA index of 34.3 events per hour. The Respiratory Disturbance Index is 49.4 events per hour.  The snore index was 0 events per hour.  Mean oxygen saturation was 94.0%.  The lowest oxygen saturation during sleep was 81.0%.  Time spent LessEqual to88% oxygen  saturation was  minutes ().  Limb Activity There were 86 limb movements recorded.  Of this total, 86 were classified as PLMs.  Of the PLMs, 9 were associated with arousals.  The Limb Movement index was 18.2 per hour while the PLM index was 18.2 per hour.  Cardiac Summary The average pulse rate was 69.3 bpm.  The minimum pulse rate was 55.0 bpm while the maximum pulse rate was 95.0 bpm.  Cardiac rhythm was NSR with rare PVCs  Diagnosis: Obstructive Sleep Apnea Unsuccessful CPAP Titration due to ongoing respiratory events.      Recommendations: 1. Recommend repeat in lab study with BIPAP titration. 2. Healthy sleep recommendations include:  adequate nightly sleep (normal 7-9 hrs/night), avoidance of caffeine after noon and alcohol near bedtime, and maintaining a sleep environment that is cool, dark and quiet. 3. Weight loss for overweight patients is recommended.  Even modest amounts of weight loss can significantly improve the severity of sleep apnea. 4. Snoring recommendations include:  weight loss where appropriate, side sleeping, and avoidance of alcohol before bed. 5. Operation of motor vehicle should be avoided when sleepy.    This study was personally reviewed and electronically signed by: Dr. Wilbert Bihari Accredited Board Certified in Sleep Medicine Date/Time: 03/07/2024 1:47PM "

## 2024-03-08 ENCOUNTER — Telehealth: Payer: Self-pay | Admitting: *Deleted

## 2024-03-08 DIAGNOSIS — I2510 Atherosclerotic heart disease of native coronary artery without angina pectoris: Secondary | ICD-10-CM

## 2024-03-08 DIAGNOSIS — R0683 Snoring: Secondary | ICD-10-CM

## 2024-03-08 DIAGNOSIS — G4733 Obstructive sleep apnea (adult) (pediatric): Secondary | ICD-10-CM

## 2024-03-08 DIAGNOSIS — R400 Somnolence: Secondary | ICD-10-CM

## 2024-03-08 DIAGNOSIS — I10 Essential (primary) hypertension: Secondary | ICD-10-CM

## 2024-03-08 NOTE — Telephone Encounter (Signed)
-----   Message from Wilbert Bihari, MD sent at 03/07/2024  1:48 PM EST ----- Unsucessful CPAP titration due to ongoing respiratory events - needs to go back to sleep lab for BiPAP titration

## 2024-03-08 NOTE — Telephone Encounter (Signed)
 The patient has been notified of the result and verbalized understanding.  All questions (if any) were answered. Veronica Hunter, CMA 03/08/2024 9:35 AM    Patient was not comfortable with anything on her face during the sleep study and feels this could be why she did not past her cpap titration. She states the sleep lab tried the 3 mask they had for her to try with the last one being a Resmed airfit P-10 nasal pillow mask size medium.  A chin strap is suggested with the patients setup.

## 2024-03-13 ENCOUNTER — Encounter (INDEPENDENT_AMBULATORY_CARE_PROVIDER_SITE_OTHER): Payer: Self-pay

## 2024-03-14 ENCOUNTER — Ambulatory Visit (HOSPITAL_BASED_OUTPATIENT_CLINIC_OR_DEPARTMENT_OTHER): Admitting: Cardiology

## 2024-03-15 ENCOUNTER — Institutional Professional Consult (permissible substitution) (INDEPENDENT_AMBULATORY_CARE_PROVIDER_SITE_OTHER): Admitting: Nurse Practitioner

## 2024-03-23 NOTE — Telephone Encounter (Signed)
 Reached out to patient to ask if she had her mask fit test done yet in the lab and she states no one  had reached out to her yet. I called the sleep lab left a message for Terri or Amy to please call the patient and get her set up.

## 2024-04-18 ENCOUNTER — Ambulatory Visit (HOSPITAL_BASED_OUTPATIENT_CLINIC_OR_DEPARTMENT_OTHER)

## 2024-05-03 ENCOUNTER — Ambulatory Visit: Admitting: Internal Medicine

## 2024-05-22 ENCOUNTER — Ambulatory Visit: Admitting: Obstetrics and Gynecology

## 2024-05-29 ENCOUNTER — Ambulatory Visit: Admitting: Diagnostic Neuroimaging

## 2024-06-20 ENCOUNTER — Encounter: Admitting: Vascular Surgery

## 2024-06-20 ENCOUNTER — Ambulatory Visit (HOSPITAL_COMMUNITY)
